# Patient Record
Sex: Male | Born: 1950
Health system: Southern US, Community
[De-identification: ages and names within clinical notes are randomized; demographics above are authoritative.]

## PROBLEM LIST (undated history)

## (undated) DIAGNOSIS — M545 Low back pain, unspecified: Secondary | ICD-10-CM

## (undated) DIAGNOSIS — E1142 Type 2 diabetes mellitus with diabetic polyneuropathy: Secondary | ICD-10-CM

## (undated) DIAGNOSIS — E785 Hyperlipidemia, unspecified: Secondary | ICD-10-CM

## (undated) DIAGNOSIS — I10 Essential (primary) hypertension: Secondary | ICD-10-CM

## (undated) DIAGNOSIS — E119 Type 2 diabetes mellitus without complications: Secondary | ICD-10-CM

## (undated) DIAGNOSIS — G5710 Meralgia paresthetica, unspecified lower limb: Secondary | ICD-10-CM

## (undated) DIAGNOSIS — D128 Benign neoplasm of rectum: Secondary | ICD-10-CM

## (undated) HISTORY — PX: EYE SURGERY: SHX253

## (undated) HISTORY — DX: Meralgia paresthetica, unspecified lower limb: G57.10

## (undated) HISTORY — DX: Low back pain: M54.5

## (undated) HISTORY — DX: Essential (primary) hypertension: I10

## (undated) HISTORY — DX: Benign neoplasm of rectum: D12.8

## (undated) HISTORY — DX: Type 2 diabetes mellitus with diabetic polyneuropathy: E11.42

## (undated) HISTORY — DX: Low back pain, unspecified: M54.50

## (undated) HISTORY — DX: Type 2 diabetes mellitus without complications: E11.9

## (undated) HISTORY — DX: Hyperlipidemia, unspecified: E78.5

---

## 1995-05-15 DIAGNOSIS — E118 Type 2 diabetes mellitus with unspecified complications: Secondary | ICD-10-CM

## 1999-07-24 ENCOUNTER — Encounter: Admission: RE | Admit: 1999-07-24 | Discharge: 1999-10-22 | Payer: Self-pay | Admitting: Family Medicine

## 2001-04-22 ENCOUNTER — Encounter: Admission: RE | Admit: 2001-04-22 | Discharge: 2001-04-22 | Payer: Self-pay | Admitting: Internal Medicine

## 2001-09-24 ENCOUNTER — Encounter: Admission: RE | Admit: 2001-09-24 | Discharge: 2001-09-24 | Payer: Self-pay | Admitting: Internal Medicine

## 2001-09-24 ENCOUNTER — Encounter: Payer: Self-pay | Admitting: Internal Medicine

## 2001-09-24 ENCOUNTER — Ambulatory Visit (HOSPITAL_COMMUNITY): Admission: RE | Admit: 2001-09-24 | Discharge: 2001-09-24 | Payer: Self-pay | Admitting: Internal Medicine

## 2001-10-29 ENCOUNTER — Encounter: Admission: RE | Admit: 2001-10-29 | Discharge: 2001-10-29 | Payer: Self-pay | Admitting: Internal Medicine

## 2001-12-05 ENCOUNTER — Encounter: Admission: RE | Admit: 2001-12-05 | Discharge: 2001-12-05 | Payer: Self-pay | Admitting: Internal Medicine

## 2002-01-07 ENCOUNTER — Encounter: Admission: RE | Admit: 2002-01-07 | Discharge: 2002-01-07 | Payer: Self-pay | Admitting: Internal Medicine

## 2002-02-09 ENCOUNTER — Encounter: Admission: RE | Admit: 2002-02-09 | Discharge: 2002-02-09 | Payer: Self-pay | Admitting: Internal Medicine

## 2002-02-18 ENCOUNTER — Encounter: Admission: RE | Admit: 2002-02-18 | Discharge: 2002-02-18 | Payer: Self-pay | Admitting: Internal Medicine

## 2002-04-02 ENCOUNTER — Encounter: Admission: RE | Admit: 2002-04-02 | Discharge: 2002-04-02 | Payer: Self-pay | Admitting: Internal Medicine

## 2002-06-01 ENCOUNTER — Encounter: Admission: RE | Admit: 2002-06-01 | Discharge: 2002-06-01 | Payer: Self-pay | Admitting: Internal Medicine

## 2002-07-06 ENCOUNTER — Encounter: Admission: RE | Admit: 2002-07-06 | Discharge: 2002-07-06 | Payer: Self-pay | Admitting: Internal Medicine

## 2002-12-04 ENCOUNTER — Encounter: Admission: RE | Admit: 2002-12-04 | Discharge: 2002-12-04 | Payer: Self-pay | Admitting: Internal Medicine

## 2003-04-13 ENCOUNTER — Encounter: Admission: RE | Admit: 2003-04-13 | Discharge: 2003-04-13 | Payer: Self-pay | Admitting: Internal Medicine

## 2003-09-10 ENCOUNTER — Encounter: Admission: RE | Admit: 2003-09-10 | Discharge: 2003-09-10 | Payer: Self-pay | Admitting: Internal Medicine

## 2004-01-21 ENCOUNTER — Ambulatory Visit: Payer: Self-pay | Admitting: Internal Medicine

## 2004-04-24 ENCOUNTER — Ambulatory Visit: Payer: Self-pay | Admitting: Internal Medicine

## 2005-04-27 ENCOUNTER — Ambulatory Visit: Payer: Self-pay | Admitting: Internal Medicine

## 2005-06-26 ENCOUNTER — Ambulatory Visit: Payer: Self-pay | Admitting: Internal Medicine

## 2006-04-03 ENCOUNTER — Ambulatory Visit: Payer: Self-pay | Admitting: Hospitalist

## 2006-05-01 ENCOUNTER — Ambulatory Visit: Payer: Self-pay | Admitting: *Deleted

## 2006-05-01 ENCOUNTER — Encounter (INDEPENDENT_AMBULATORY_CARE_PROVIDER_SITE_OTHER): Payer: Self-pay | Admitting: Ophthalmology

## 2006-05-01 LAB — CONVERTED CEMR LAB
ALT: 14 units/L (ref 0–53)
AST: 11 units/L (ref 0–37)
Albumin: 3.9 g/dL (ref 3.5–5.2)
Alkaline Phosphatase: 75 units/L (ref 39–117)
BUN: 8 mg/dL (ref 6–23)
CO2: 28 meq/L (ref 19–32)
Calcium: 9.3 mg/dL (ref 8.4–10.5)
Chloride: 97 meq/L (ref 96–112)
Cholesterol: 224 mg/dL — ABNORMAL HIGH (ref 0–200)
Creatinine, Ser: 0.7 mg/dL (ref 0.40–1.50)
Glucose, Bld: 400 mg/dL — ABNORMAL HIGH (ref 70–99)
HDL: 55 mg/dL (ref >39)
LDL Cholesterol: 138 mg/dL — ABNORMAL HIGH (ref 0–99)
Potassium: 4 meq/L (ref 3.5–5.3)
Sodium: 131 meq/L — ABNORMAL LOW (ref 135–145)
Total Bilirubin: 0.5 mg/dL (ref 0.3–1.2)
Total CHOL/HDL Ratio: 4.1
Total Protein: 6.4 g/dL (ref 6.0–8.3)
Triglycerides: 155 mg/dL — ABNORMAL HIGH (ref <150)
VLDL: 31 mg/dL (ref 0–40)

## 2006-05-27 ENCOUNTER — Ambulatory Visit: Payer: Self-pay | Admitting: Internal Medicine

## 2006-08-14 ENCOUNTER — Telehealth: Payer: Self-pay | Admitting: *Deleted

## 2007-01-27 ENCOUNTER — Telehealth: Payer: Self-pay | Admitting: *Deleted

## 2007-03-20 ENCOUNTER — Ambulatory Visit: Payer: Self-pay | Admitting: Internal Medicine

## 2007-03-20 ENCOUNTER — Encounter (INDEPENDENT_AMBULATORY_CARE_PROVIDER_SITE_OTHER): Payer: Self-pay | Admitting: Internal Medicine

## 2007-03-20 LAB — CONVERTED CEMR LAB
ALT: 15 units/L (ref 0–53)
AST: 10 units/L (ref 0–37)
Albumin: 4.3 g/dL (ref 3.5–5.2)
Alkaline Phosphatase: 50 units/L (ref 39–117)
BUN: 19 mg/dL (ref 6–23)
Blood Glucose, Fingerstick: 382
CO2: 25 meq/L (ref 19–32)
Calcium: 9.6 mg/dL (ref 8.4–10.5)
Chloride: 96 meq/L (ref 96–112)
Creatinine, Ser: 0.96 mg/dL (ref 0.40–1.50)
Glucose, Bld: 506 mg/dL (ref 70–99)
HCT: 40.8 % (ref 39.0–52.0)
Hemoglobin: 14.1 g/dL (ref 13.0–17.0)
Hgb A1c MFr Bld: >14 %
MCHC: 34.6 g/dL (ref 30.0–36.0)
MCV: 92.7 fL (ref 78.0–100.0)
Platelets: 208 10*3/uL (ref 150–400)
Potassium: 4.4 meq/L (ref 3.5–5.3)
RBC: 4.4 M/uL (ref 4.22–5.81)
RDW: 12.2 % (ref 11.5–14.0)
Sodium: 135 meq/L (ref 135–145)
Total Bilirubin: 0.4 mg/dL (ref 0.3–1.2)
Total Protein: 6.6 g/dL (ref 6.0–8.3)
WBC: 6.2 10*3/uL (ref 4.0–10.5)

## 2007-03-21 ENCOUNTER — Telehealth (INDEPENDENT_AMBULATORY_CARE_PROVIDER_SITE_OTHER): Payer: Self-pay | Admitting: Infectious Diseases

## 2007-03-27 ENCOUNTER — Encounter (INDEPENDENT_AMBULATORY_CARE_PROVIDER_SITE_OTHER): Payer: Self-pay | Admitting: Internal Medicine

## 2007-03-27 ENCOUNTER — Ambulatory Visit: Payer: Self-pay | Admitting: Internal Medicine

## 2007-03-27 LAB — CONVERTED CEMR LAB
Cholesterol: 187 mg/dL (ref 0–200)
HDL: 80 mg/dL (ref >39)
LDL Cholesterol: 87 mg/dL (ref 0–99)
Total CHOL/HDL Ratio: 2.3
Triglycerides: 102 mg/dL (ref <150)
VLDL: 20 mg/dL (ref 0–40)

## 2007-05-19 ENCOUNTER — Ambulatory Visit: Payer: Self-pay | Admitting: Internal Medicine

## 2007-05-19 DIAGNOSIS — F528 Other sexual dysfunction not due to a substance or known physiological condition: Secondary | ICD-10-CM

## 2007-05-19 DIAGNOSIS — G5710 Meralgia paresthetica, unspecified lower limb: Secondary | ICD-10-CM | POA: Insufficient documentation

## 2007-05-19 LAB — CONVERTED CEMR LAB: Blood Glucose, Fingerstick: 336

## 2007-06-02 ENCOUNTER — Ambulatory Visit: Payer: Self-pay | Admitting: Internal Medicine

## 2007-06-02 ENCOUNTER — Encounter (INDEPENDENT_AMBULATORY_CARE_PROVIDER_SITE_OTHER): Payer: Self-pay | Admitting: *Deleted

## 2007-06-02 LAB — CONVERTED CEMR LAB: Blood Glucose, Fingerstick: 246

## 2007-06-03 DIAGNOSIS — L299 Pruritus, unspecified: Secondary | ICD-10-CM | POA: Insufficient documentation

## 2007-06-03 LAB — CONVERTED CEMR LAB
ALT: 27 units/L (ref 0–53)
AST: 15 units/L (ref 0–37)
Albumin: 4.5 g/dL (ref 3.5–5.2)
Alkaline Phosphatase: 55 units/L (ref 39–117)
BUN: 14 mg/dL (ref 6–23)
CO2: 25 meq/L (ref 19–32)
Calcium: 9.9 mg/dL (ref 8.4–10.5)
Chloride: 102 meq/L (ref 96–112)
Creatinine, Ser: 0.68 mg/dL (ref 0.40–1.50)
Glucose, Bld: 239 mg/dL — ABNORMAL HIGH (ref 70–99)
Potassium: 4.3 meq/L (ref 3.5–5.3)
Sodium: 140 meq/L (ref 135–145)
Total Bilirubin: 0.5 mg/dL (ref 0.3–1.2)
Total Protein: 7.1 g/dL (ref 6.0–8.3)

## 2007-06-09 ENCOUNTER — Telehealth (INDEPENDENT_AMBULATORY_CARE_PROVIDER_SITE_OTHER): Payer: Self-pay | Admitting: Pharmacy Technician

## 2007-07-03 ENCOUNTER — Ambulatory Visit: Payer: Self-pay | Admitting: Internal Medicine

## 2007-07-03 ENCOUNTER — Encounter (INDEPENDENT_AMBULATORY_CARE_PROVIDER_SITE_OTHER): Payer: Self-pay | Admitting: *Deleted

## 2007-07-03 LAB — CONVERTED CEMR LAB
Basophils Absolute: 0 10*3/uL (ref 0.0–0.1)
Basophils Relative: 0 % (ref 0–1)
Blood Glucose, Fingerstick: 178
Eosinophils Absolute: 0.1 10*3/uL (ref 0.0–0.7)
Eosinophils Relative: 2 % (ref 0–5)
HCT: 37.9 % — ABNORMAL LOW (ref 39.0–52.0)
Hemoglobin: 13.6 g/dL (ref 13.0–17.0)
Hgb A1c MFr Bld: 8.6 %
Lymphocytes Relative: 25 % (ref 12–46)
Lymphs Abs: 2 10*3/uL (ref 0.7–4.0)
MCHC: 35.9 g/dL (ref 30.0–36.0)
MCV: 87.9 fL (ref 78.0–100.0)
Monocytes Absolute: 0.6 10*3/uL (ref 0.1–1.0)
Monocytes Relative: 7 % (ref 3–12)
Neutro Abs: 5.3 10*3/uL (ref 1.7–7.7)
Neutrophils Relative %: 66 % (ref 43–77)
Platelets: 255 10*3/uL (ref 150–400)
RBC: 4.31 M/uL (ref 4.22–5.81)
RDW: 12.3 % (ref 11.5–15.5)
WBC: 8 10*3/uL (ref 4.0–10.5)

## 2007-07-23 ENCOUNTER — Ambulatory Visit: Payer: Self-pay | Admitting: Infectious Diseases

## 2007-07-23 DIAGNOSIS — E1142 Type 2 diabetes mellitus with diabetic polyneuropathy: Secondary | ICD-10-CM | POA: Insufficient documentation

## 2007-07-23 LAB — CONVERTED CEMR LAB: Blood Glucose, Fingerstick: 276

## 2007-08-06 ENCOUNTER — Encounter (INDEPENDENT_AMBULATORY_CARE_PROVIDER_SITE_OTHER): Payer: Self-pay | Admitting: Internal Medicine

## 2007-08-24 ENCOUNTER — Encounter (INDEPENDENT_AMBULATORY_CARE_PROVIDER_SITE_OTHER): Payer: Self-pay | Admitting: Internal Medicine

## 2007-08-28 ENCOUNTER — Ambulatory Visit: Payer: Self-pay | Admitting: Infectious Disease

## 2007-08-28 ENCOUNTER — Encounter (INDEPENDENT_AMBULATORY_CARE_PROVIDER_SITE_OTHER): Payer: Self-pay | Admitting: Internal Medicine

## 2007-08-28 LAB — CONVERTED CEMR LAB: Blood Glucose, Fingerstick: 235

## 2007-08-29 LAB — CONVERTED CEMR LAB
Creatinine, Urine: 162.4 mg/dL
Microalb Creat Ratio: 17 mg/g
Microalb, Ur: 2.76 mg/dL — ABNORMAL HIGH

## 2007-09-05 ENCOUNTER — Encounter (INDEPENDENT_AMBULATORY_CARE_PROVIDER_SITE_OTHER): Payer: Self-pay | Admitting: Internal Medicine

## 2007-09-10 ENCOUNTER — Encounter (INDEPENDENT_AMBULATORY_CARE_PROVIDER_SITE_OTHER): Payer: Self-pay | Admitting: Internal Medicine

## 2007-09-24 ENCOUNTER — Encounter (INDEPENDENT_AMBULATORY_CARE_PROVIDER_SITE_OTHER): Payer: Self-pay | Admitting: Internal Medicine

## 2007-09-24 ENCOUNTER — Ambulatory Visit: Payer: Self-pay | Admitting: Infectious Disease

## 2007-09-24 DIAGNOSIS — M25569 Pain in unspecified knee: Secondary | ICD-10-CM

## 2007-09-24 LAB — CONVERTED CEMR LAB
ALT: 11 units/L (ref 0–53)
AST: 9 units/L (ref 0–37)
Albumin: 4.3 g/dL (ref 3.5–5.2)
Alkaline Phosphatase: 49 units/L (ref 39–117)
BUN: 14 mg/dL (ref 6–23)
CO2: 24 meq/L (ref 19–32)
Calcium: 9.4 mg/dL (ref 8.4–10.5)
Chloride: 97 meq/L (ref 96–112)
Creatinine, Ser: 0.74 mg/dL (ref 0.40–1.50)
Glucose, Bld: 261 mg/dL — ABNORMAL HIGH (ref 70–99)
Potassium: 4.3 meq/L (ref 3.5–5.3)
Sodium: 134 meq/L — ABNORMAL LOW (ref 135–145)
Total Bilirubin: 0.3 mg/dL (ref 0.3–1.2)
Total Protein: 6.7 g/dL (ref 6.0–8.3)

## 2007-09-30 ENCOUNTER — Telehealth (INDEPENDENT_AMBULATORY_CARE_PROVIDER_SITE_OTHER): Payer: Self-pay | Admitting: Internal Medicine

## 2007-10-22 ENCOUNTER — Encounter: Payer: Self-pay | Admitting: Internal Medicine

## 2007-10-22 DIAGNOSIS — E11319 Type 2 diabetes mellitus with unspecified diabetic retinopathy without macular edema: Secondary | ICD-10-CM | POA: Insufficient documentation

## 2007-10-22 LAB — HM DIABETES EYE EXAM

## 2007-11-25 ENCOUNTER — Ambulatory Visit: Payer: Self-pay | Admitting: Infectious Diseases

## 2007-11-25 DIAGNOSIS — K2900 Acute gastritis without bleeding: Secondary | ICD-10-CM | POA: Insufficient documentation

## 2007-11-25 LAB — CONVERTED CEMR LAB
Blood Glucose, Fingerstick: 138
Hgb A1c MFr Bld: 6.3 %

## 2008-03-10 ENCOUNTER — Encounter: Payer: Self-pay | Admitting: Internal Medicine

## 2008-03-10 ENCOUNTER — Ambulatory Visit (HOSPITAL_COMMUNITY): Admission: RE | Admit: 2008-03-10 | Discharge: 2008-03-10 | Payer: Self-pay | Admitting: Internal Medicine

## 2008-03-10 ENCOUNTER — Ambulatory Visit: Payer: Self-pay | Admitting: Internal Medicine

## 2008-03-10 ENCOUNTER — Telehealth (INDEPENDENT_AMBULATORY_CARE_PROVIDER_SITE_OTHER): Payer: Self-pay | Admitting: Internal Medicine

## 2008-03-10 LAB — CONVERTED CEMR LAB
Blood Glucose, Fingerstick: 109
Hgb A1c MFr Bld: 6.1 %

## 2008-03-16 LAB — CONVERTED CEMR LAB
ALT: 10 units/L (ref 0–53)
AST: 11 units/L (ref 0–37)
Albumin: 4.7 g/dL (ref 3.5–5.2)
Alkaline Phosphatase: 77 units/L (ref 39–117)
BUN: 11 mg/dL (ref 6–23)
CO2: 26 meq/L (ref 19–32)
Calcium: 10 mg/dL (ref 8.4–10.5)
Chloride: 101 meq/L (ref 96–112)
Cholesterol: 177 mg/dL (ref 0–200)
Creatinine, Ser: 0.84 mg/dL (ref 0.40–1.50)
Creatinine, Urine: 224.6 mg/dL
Glucose, Bld: 102 mg/dL — ABNORMAL HIGH (ref 70–99)
HDL: 48 mg/dL (ref >39)
LDL Cholesterol: 103 mg/dL — ABNORMAL HIGH (ref 0–99)
Microalb Creat Ratio: 16.1 mg/g (ref 0.0–30.0)
Microalb, Ur: 3.61 mg/dL — ABNORMAL HIGH (ref 0.00–1.89)
Potassium: 4.3 meq/L (ref 3.5–5.3)
Sodium: 139 meq/L (ref 135–145)
Total Bilirubin: 0.4 mg/dL (ref 0.3–1.2)
Total CHOL/HDL Ratio: 3.7
Total Protein: 7.5 g/dL (ref 6.0–8.3)
Triglycerides: 130 mg/dL (ref <150)
VLDL: 26 mg/dL (ref 0–40)

## 2008-06-15 ENCOUNTER — Encounter: Payer: Self-pay | Admitting: Internal Medicine

## 2008-06-15 ENCOUNTER — Ambulatory Visit: Payer: Self-pay | Admitting: Internal Medicine

## 2008-07-09 ENCOUNTER — Ambulatory Visit: Payer: Self-pay | Admitting: Internal Medicine

## 2008-07-09 DIAGNOSIS — I10 Essential (primary) hypertension: Secondary | ICD-10-CM | POA: Insufficient documentation

## 2008-07-09 LAB — CONVERTED CEMR LAB: Blood Glucose, Fingerstick: 208

## 2008-08-16 ENCOUNTER — Ambulatory Visit: Payer: Self-pay | Admitting: Internal Medicine

## 2008-08-16 DIAGNOSIS — M545 Low back pain: Secondary | ICD-10-CM

## 2008-08-16 LAB — CONVERTED CEMR LAB: Blood Glucose, Fingerstick: 148

## 2008-08-18 ENCOUNTER — Telehealth (INDEPENDENT_AMBULATORY_CARE_PROVIDER_SITE_OTHER): Payer: Self-pay | Admitting: Pharmacy Technician

## 2008-09-01 ENCOUNTER — Ambulatory Visit: Payer: Self-pay | Admitting: Internal Medicine

## 2008-09-01 ENCOUNTER — Encounter: Payer: Self-pay | Admitting: Internal Medicine

## 2008-09-01 LAB — CONVERTED CEMR LAB
BUN: 18 mg/dL (ref 6–23)
Blood Glucose, Fingerstick: 261
CO2: 25 meq/L (ref 19–32)
Calcium: 9.5 mg/dL (ref 8.4–10.5)
Chloride: 102 meq/L (ref 96–112)
Creatinine, Ser: 1.17 mg/dL (ref 0.40–1.50)
GFR calc Af Amer: >60 mL/min (ref >60)
GFR calc non Af Amer: >60 mL/min (ref >60)
Glucose, Bld: 286 mg/dL — ABNORMAL HIGH (ref 70–99)
Potassium: 4.8 meq/L (ref 3.5–5.3)
Sodium: 137 meq/L (ref 135–145)

## 2008-09-14 ENCOUNTER — Ambulatory Visit: Payer: Self-pay | Admitting: Infectious Disease

## 2008-09-14 LAB — CONVERTED CEMR LAB
OCCULT 1: NEGATIVE
OCCULT 2: NEGATIVE
OCCULT 3: NEGATIVE

## 2008-09-14 LAB — FECAL OCCULT BLOOD, GUAIAC: Fecal Occult Blood: NEGATIVE

## 2008-10-05 ENCOUNTER — Ambulatory Visit: Payer: Self-pay | Admitting: Infectious Disease

## 2008-10-05 ENCOUNTER — Encounter (INDEPENDENT_AMBULATORY_CARE_PROVIDER_SITE_OTHER): Payer: Self-pay | Admitting: Internal Medicine

## 2008-10-05 LAB — CONVERTED CEMR LAB
Blood Glucose, AC Bkfst: 119 mg/dL
Creatinine, Urine: 134.5 mg/dL
Hgb A1c MFr Bld: 7 %
Microalb Creat Ratio: 6.3 mg/g (ref 0.0–30.0)
Microalb, Ur: 0.85 mg/dL (ref 0.00–1.89)

## 2008-10-06 DIAGNOSIS — E781 Pure hyperglyceridemia: Secondary | ICD-10-CM

## 2008-10-06 LAB — CONVERTED CEMR LAB
Cholesterol: 150 mg/dL (ref 0–200)
HDL: 36 mg/dL — ABNORMAL LOW (ref >39)
LDL Cholesterol: 81 mg/dL (ref 0–99)
Total CHOL/HDL Ratio: 4.2
Triglycerides: 167 mg/dL — ABNORMAL HIGH (ref <150)
VLDL: 33 mg/dL (ref 0–40)

## 2008-10-19 ENCOUNTER — Ambulatory Visit: Payer: Self-pay | Admitting: Internal Medicine

## 2008-10-19 ENCOUNTER — Encounter (INDEPENDENT_AMBULATORY_CARE_PROVIDER_SITE_OTHER): Payer: Self-pay | Admitting: Internal Medicine

## 2008-10-19 LAB — CONVERTED CEMR LAB
ALT: 11 units/L (ref 0–53)
AST: 12 units/L (ref 0–37)
Albumin: 4.5 g/dL (ref 3.5–5.2)
Alkaline Phosphatase: 68 units/L (ref 39–117)
BUN: 22 mg/dL (ref 6–23)
Blood Glucose, Fingerstick: 234
CO2: 24 meq/L (ref 19–32)
Calcium: 9.1 mg/dL (ref 8.4–10.5)
Chloride: 106 meq/L (ref 96–112)
Creatinine, Ser: 1.15 mg/dL (ref 0.40–1.50)
GFR calc Af Amer: >60 mL/min (ref >60)
GFR calc non Af Amer: >60 mL/min (ref >60)
Glucose, Bld: 238 mg/dL — ABNORMAL HIGH (ref 70–99)
Potassium: 4.6 meq/L (ref 3.5–5.3)
Sodium: 141 meq/L (ref 135–145)
Total Bilirubin: 0.2 mg/dL — ABNORMAL LOW (ref 0.3–1.2)
Total Protein: 6.9 g/dL (ref 6.0–8.3)

## 2008-10-25 ENCOUNTER — Encounter: Payer: Self-pay | Admitting: Internal Medicine

## 2008-11-01 ENCOUNTER — Ambulatory Visit: Payer: Self-pay | Admitting: Internal Medicine

## 2008-11-01 LAB — CONVERTED CEMR LAB: Blood Glucose, Home Monitor: 2 mg/dL

## 2008-11-22 ENCOUNTER — Ambulatory Visit: Payer: Self-pay | Admitting: Internal Medicine

## 2008-11-22 ENCOUNTER — Encounter: Payer: Self-pay | Admitting: Internal Medicine

## 2008-11-22 LAB — CONVERTED CEMR LAB: Blood Glucose, Fingerstick: 180

## 2008-11-25 ENCOUNTER — Telehealth: Payer: Self-pay | Admitting: Internal Medicine

## 2008-12-28 ENCOUNTER — Ambulatory Visit: Payer: Self-pay | Admitting: Internal Medicine

## 2008-12-28 LAB — CONVERTED CEMR LAB
Blood Glucose, Fingerstick: 164
Hgb A1c MFr Bld: 7.3 %

## 2009-03-18 ENCOUNTER — Telehealth: Payer: Self-pay | Admitting: Internal Medicine

## 2009-03-30 ENCOUNTER — Ambulatory Visit: Payer: Self-pay | Admitting: Internal Medicine

## 2009-03-30 ENCOUNTER — Encounter: Payer: Self-pay | Admitting: Internal Medicine

## 2009-03-30 LAB — CONVERTED CEMR LAB
Blood Glucose, Fingerstick: 275
Hgb A1c MFr Bld: 8.7 %

## 2009-03-31 ENCOUNTER — Encounter: Payer: Self-pay | Admitting: Internal Medicine

## 2009-06-13 IMAGING — CR DG KNEE COMPLETE 4+V*R*
4 series · 4 of 4 positions shown · non-contrast
Comparison: None available.

CLINICAL DATA: Knee pain.

RIGHT KNEE - COMPLETE 4+ VIEW

[t knee ap right]
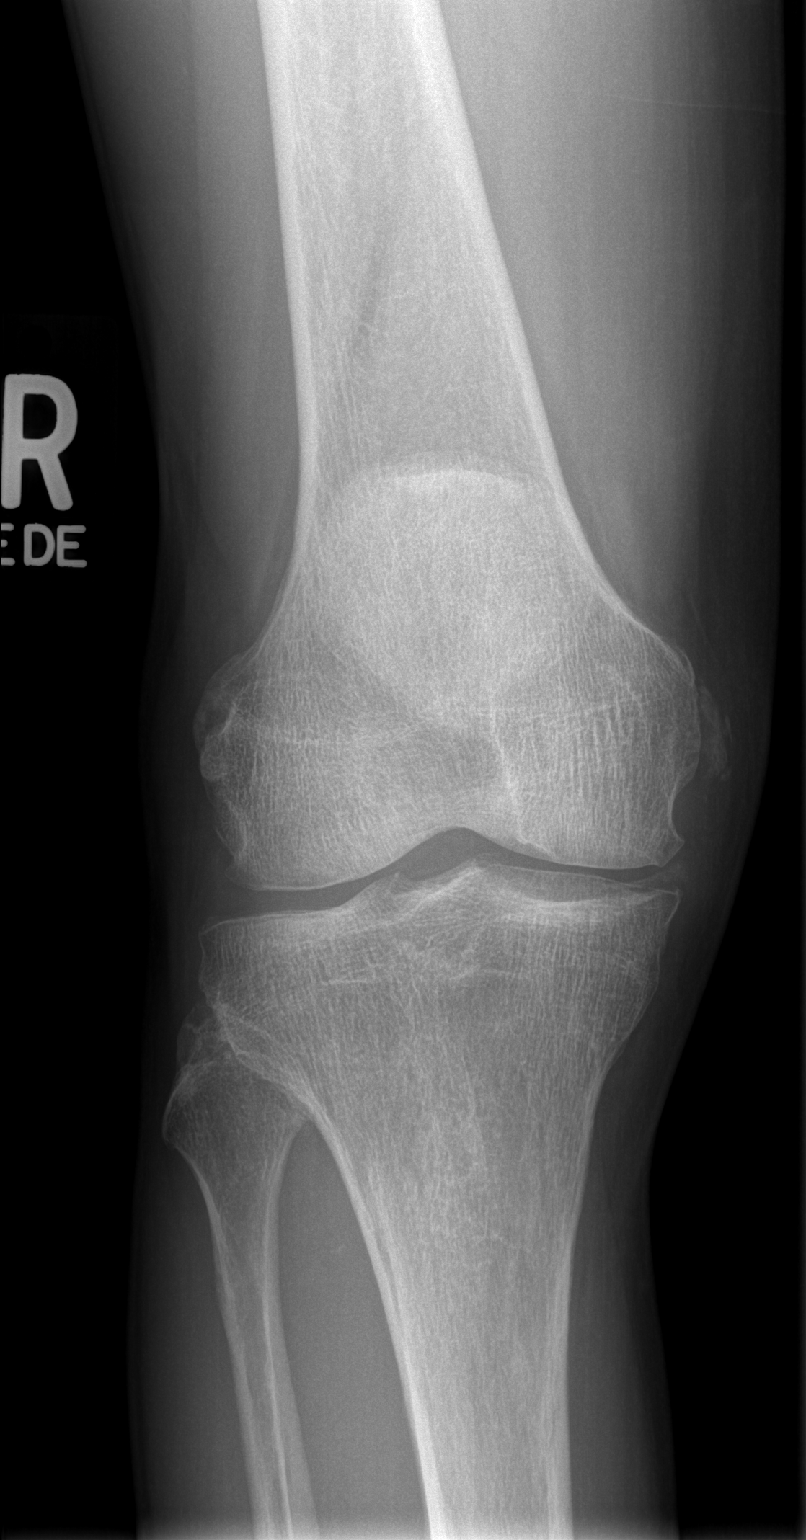

[t knee oblique right (1 of 2)]
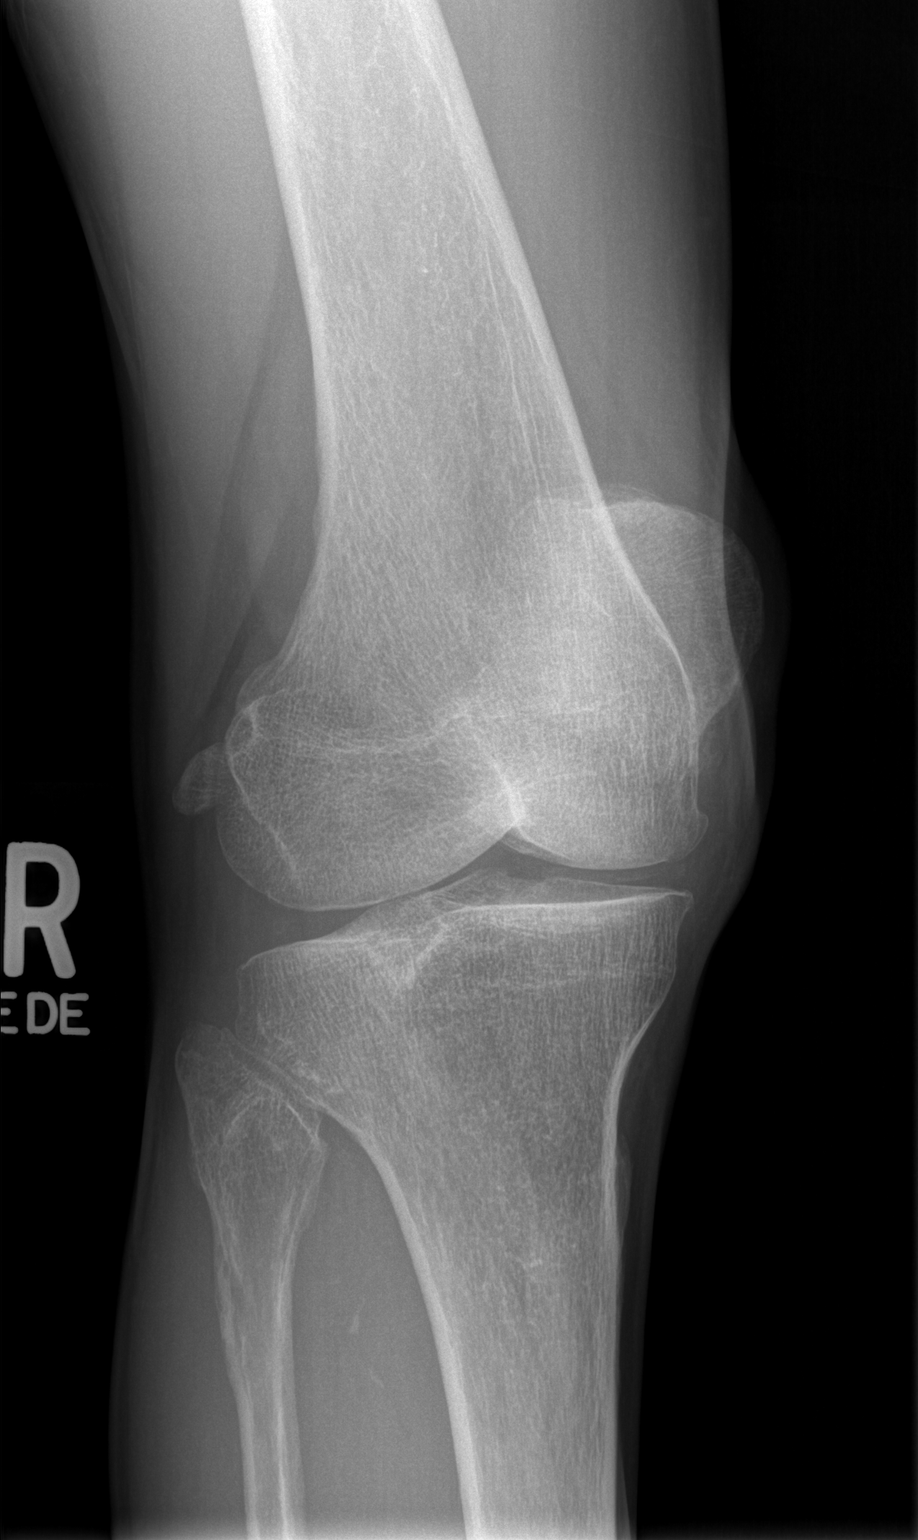

[t knee oblique right (2 of 2)]
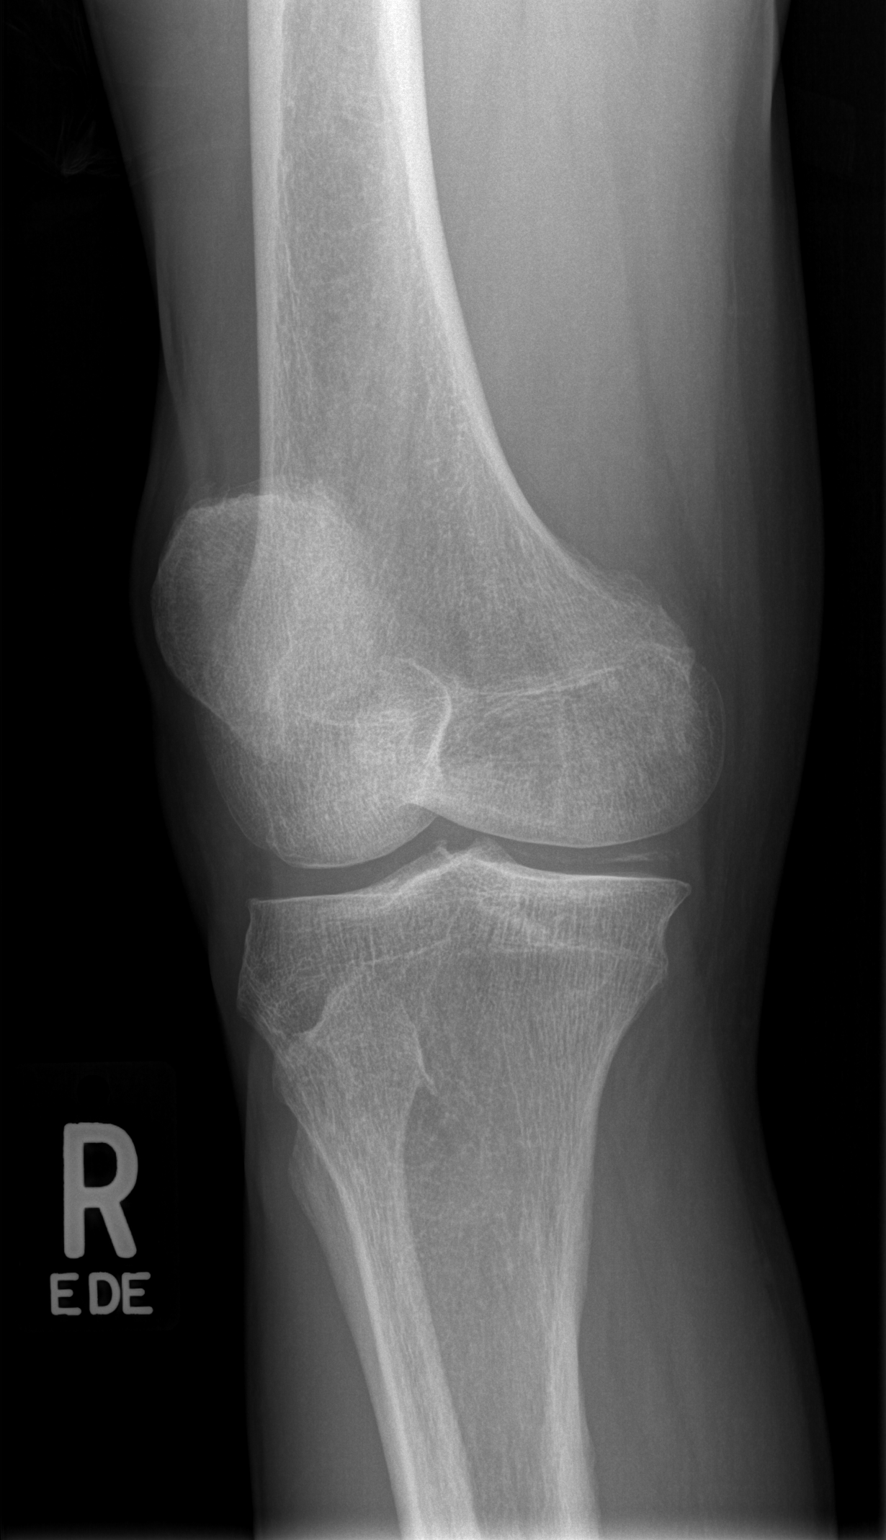

[t knee lat right]
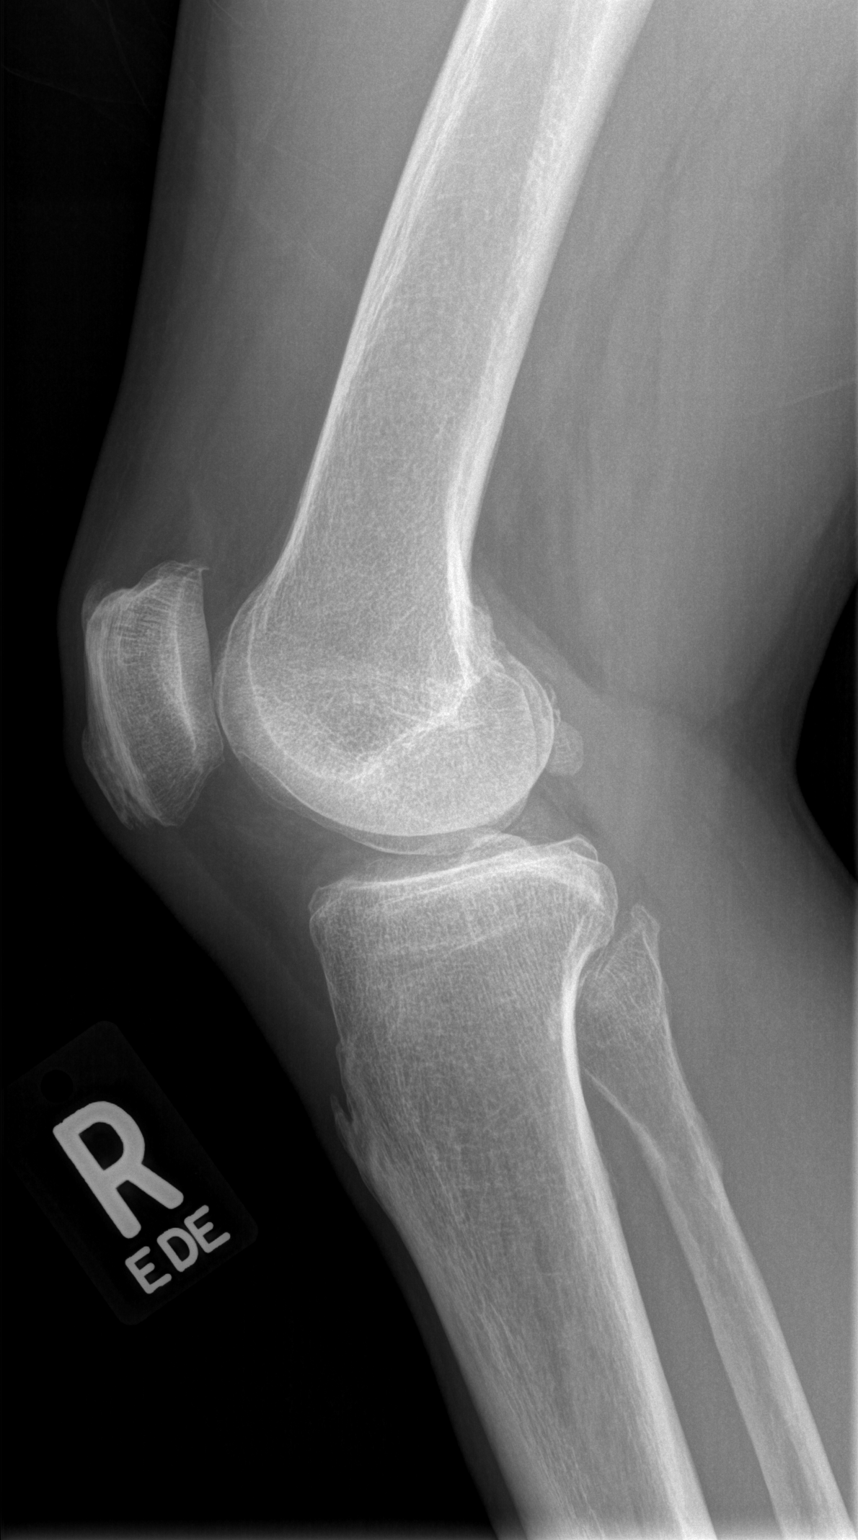

[4 of 4 positions shown; findings below may reference images not displayed]

FINDINGS: Calcification off the medial femoral condyle is
consistent with old MCL injury.  There is degenerative change about
the knee with osteophytes most prominent in the medial compartment.
Chondrocalcinosis is also seen and most prominent in the medial
compartment.  No fracture or joint effusion.
IMPRESSION: 1.  No acute finding.
2.  Degenerative disease about the knee most notable the medial
compartment.
3.  Findings compatible with old MCL injury.

## 2009-06-23 ENCOUNTER — Encounter: Payer: Self-pay | Admitting: Internal Medicine

## 2009-07-05 ENCOUNTER — Ambulatory Visit: Payer: Self-pay | Admitting: Internal Medicine

## 2009-07-05 LAB — CONVERTED CEMR LAB
ALT: 11 units/L (ref 0–53)
AST: 11 units/L (ref 0–37)
Albumin: 4.6 g/dL (ref 3.5–5.2)
Alkaline Phosphatase: 71 units/L (ref 39–117)
BUN: 17 mg/dL (ref 6–23)
Blood Glucose, Fingerstick: 129
CO2: 24 meq/L (ref 19–32)
Calcium: 9.7 mg/dL (ref 8.4–10.5)
Chloride: 104 meq/L (ref 96–112)
Cholesterol: 139 mg/dL (ref 0–200)
Creatinine, Ser: 1.1 mg/dL (ref 0.40–1.50)
Glucose, Bld: 133 mg/dL — ABNORMAL HIGH (ref 70–99)
HCT: 35.2 % — ABNORMAL LOW (ref 39.0–52.0)
HDL: 40 mg/dL (ref >39)
Hemoglobin: 11.5 g/dL — ABNORMAL LOW (ref 13.0–17.0)
Hgb A1c MFr Bld: 8.5 %
LDL Cholesterol: 77 mg/dL (ref 0–99)
MCHC: 32.7 g/dL (ref 30.0–36.0)
MCV: 87.8 fL (ref >=78.0)
Platelets: 211 10*3/uL (ref 150–400)
Potassium: 5 meq/L (ref 3.5–5.3)
RBC: 4.01 M/uL — ABNORMAL LOW (ref 4.22–5.81)
RDW: 13.2 % (ref 11.5–15.5)
Sodium: 139 meq/L (ref 135–145)
Total Bilirubin: 0.3 mg/dL (ref 0.3–1.2)
Total CHOL/HDL Ratio: 3.5
Total Protein: 7 g/dL (ref 6.0–8.3)
Triglycerides: 109 mg/dL (ref <150)
VLDL: 22 mg/dL (ref 0–40)
WBC: 6 10*3/uL (ref 4.0–10.5)

## 2009-08-03 ENCOUNTER — Encounter: Payer: Self-pay | Admitting: Internal Medicine

## 2009-11-03 ENCOUNTER — Ambulatory Visit: Payer: Self-pay | Admitting: Internal Medicine

## 2009-11-03 LAB — CONVERTED CEMR LAB
Blood Glucose, Fingerstick: 310
Hgb A1c MFr Bld: 8.1 %

## 2010-02-14 ENCOUNTER — Encounter: Payer: Self-pay | Admitting: Internal Medicine

## 2010-02-16 ENCOUNTER — Ambulatory Visit: Payer: Self-pay | Admitting: Internal Medicine

## 2010-02-16 DIAGNOSIS — R404 Transient alteration of awareness: Secondary | ICD-10-CM

## 2010-02-16 LAB — CONVERTED CEMR LAB
Blood Glucose, Fingerstick: 259
Hgb A1c MFr Bld: 8.5 %

## 2010-05-02 ENCOUNTER — Telehealth: Payer: Self-pay | Admitting: Internal Medicine

## 2010-05-25 ENCOUNTER — Ambulatory Visit
Admission: RE | Admit: 2010-05-25 | Discharge: 2010-05-25 | Payer: Self-pay | Source: Home / Self Care | Attending: Internal Medicine | Admitting: Internal Medicine

## 2010-05-25 ENCOUNTER — Encounter (INDEPENDENT_AMBULATORY_CARE_PROVIDER_SITE_OTHER): Payer: Self-pay | Admitting: *Deleted

## 2010-05-25 LAB — CONVERTED CEMR LAB
Blood Glucose, Fingerstick: 204
Creatinine, Urine: 61.1 mg/dL
Hgb A1c MFr Bld: 8.2 %
Microalb Creat Ratio: 19.5 mg/g (ref 0.0–30.0)
Microalb, Ur: 1.19 mg/dL (ref 0.00–1.89)

## 2010-05-29 LAB — GLUCOSE, CAPILLARY: Glucose-Capillary: 204 mg/dL — ABNORMAL HIGH (ref 70–99)

## 2010-05-31 ENCOUNTER — Encounter: Payer: Self-pay | Admitting: Internal Medicine

## 2010-06-02 ENCOUNTER — Telehealth: Payer: Self-pay | Admitting: Licensed Clinical Social Worker

## 2010-06-02 ENCOUNTER — Encounter (INDEPENDENT_AMBULATORY_CARE_PROVIDER_SITE_OTHER): Payer: Self-pay | Admitting: *Deleted

## 2010-06-07 ENCOUNTER — Encounter: Payer: Self-pay | Admitting: Licensed Clinical Social Worker

## 2010-06-09 ENCOUNTER — Encounter (INDEPENDENT_AMBULATORY_CARE_PROVIDER_SITE_OTHER): Payer: Self-pay | Admitting: *Deleted

## 2010-06-09 ENCOUNTER — Ambulatory Visit
Admission: RE | Admit: 2010-06-09 | Discharge: 2010-06-09 | Payer: Self-pay | Source: Home / Self Care | Attending: Internal Medicine | Admitting: Internal Medicine

## 2010-06-11 LAB — CONVERTED CEMR LAB
ALT: 13 units/L (ref 0–53)
AST: 12 units/L (ref 0–37)
Albumin: 4.4 g/dL (ref 3.5–5.2)
Alkaline Phosphatase: 81 units/L (ref 39–117)
BUN: 16 mg/dL (ref 6–23)
Blood Glucose, Fingerstick: 99
CO2: 22 meq/L (ref 19–32)
Calcium: 9.6 mg/dL (ref 8.4–10.5)
Chloride: 102 meq/L (ref 96–112)
Cholesterol: 202 mg/dL — ABNORMAL HIGH (ref 0–200)
Creatinine, Ser: 0.89 mg/dL (ref 0.40–1.50)
Glucose, Bld: 95 mg/dL (ref 70–99)
HDL: 49 mg/dL (ref >39)
Hgb A1c MFr Bld: 6.7 %
LDL Cholesterol: 126 mg/dL — ABNORMAL HIGH (ref 0–99)
Potassium: 4.8 meq/L (ref 3.5–5.3)
Sodium: 143 meq/L (ref 135–145)
TSH: 2.348 microintl units/mL (ref 0.350–4.50)
Testosterone: 345.78 ng/dL — ABNORMAL LOW (ref 350–890)
Total Bilirubin: 0.3 mg/dL (ref 0.3–1.2)
Total CHOL/HDL Ratio: 4.1
Total Protein: 6.8 g/dL (ref 6.0–8.3)
Triglycerides: 133 mg/dL (ref <150)
VLDL: 27 mg/dL (ref 0–40)

## 2010-06-13 NOTE — Assessment & Plan Note (Signed)
Summary: EST-3 MONTH CHECKUP/CH   Vital Signs:  Patient profile:   60 year old male Height:      64.5 inches (163.83 cm) Weight:      153.04 pounds (69.56 kg) BMI:     25.96 Temp:     97.1 degrees F (36.17 degrees C) oral Pulse rate:   69 / minute BP sitting:   114 / 68  (right arm) Cuff size:   regular  Vitals Entered By: Sander Nephew RN (February 16, 2010 3:41 PM) CC: Depression Is Patient Diabetic? Yes Did you bring your meter with you today? No Pain Assessment Patient in pain? no      Nutritional Status BMI of 25 - 29 = overweight CBG Result 259  Have you ever been in a relationship where you felt threatened, hurt or afraid?No   Does patient need assistance? Functional Status Self care Ambulation Normal Comments Check up.   Primary Care [facility] Can:  Rudie Meyer MD  CC:  Depression.  History of Present Illness: 60 yr old man with pmhx as described below comes to the clinic for follow up. Patient has no complains.  Reports to have gotten Medicare.  Depression History:      The patient denies a depressed mood most of the day and a diminished interest in his usual daily activities.         Preventive Screening-Counseling & Management  Alcohol-Tobacco     Alcohol type: occasionally     Smoking Status: never     Year Quit: quit smoking  8 months ago  Problems Prior to Update: 1)  Preventive Health Care  (ICD-V70.0) 2)  Back Pain, Lumbar  (ICD-724.2) 3)  Hypertension, Benign Essential, Controlled  (ICD-401.1) 4)  Knee Pain  (ICD-719.46) 5)  Acute Gastritis Without Mention of Hemorrhage  (ICD-535.00) 6)  Knee Pain, Bilateral  (ICD-719.46) 7)  Diabetic Peripheral Neuropathy  (ICD-250.60) 8)  Pruritus  (ICD-698.9) 9)  Erectile Dysfunction  (ICD-302.72) 10)  Meralgia Paresthetica  (ICD-355.1) 11)  Diabetes Mellitus, Type II  (ICD-250.00) 12)  Dyslipidemia  (ICD-272.4)  Medications Prior to Update: 1)  Lantus 100 Unit/ml Soln (Insulin Glargine) ....  Inject 15 Units Before Bedtime. 2)  Relion Mini Pen Needles 31g X 6 Mm  Misc (Insulin Pen Needle) .... Use To Inject Lantus Iinsulin Once Daily 3)  Glucophage 1000 Mg  Tabs (Metformin Hcl) .... Take 1 Tablet By Mouth Two Times A Day 4)  Anacin 81 Mg Tbec (Aspirin) .... Take 1 Tablet By Mouth Once A Day 5)  Hydrocortisone 1 % Crea (Hydrocortisone) .... Aplica [place] Dos A Cuatro Vezes A Dia 6)  Pravachol 40 Mg Tabs (Pravastatin Sodium) .... Take 1 Tablet By Mouth Once A Day 7)  Viagra 50 Mg Tabs (Sildenafil Citrate) .... Take One Tablet One Hour Before Intercourse 8)  Prinivil 10 Mg Tabs (Lisinopril) .... Take 1/2  Tablet By Mouth Once A Day 9)  Bayer Contour Test  Strp (Glucose Blood) .... Tocar Su [place] 10)  Lancets Ultra Thin  Misc (Lancets) .... Tocar Su Azucar [place] Cena  Current Medications (verified): 1)  Lantus 100 Unit/ml Soln (Insulin Glargine) .... Inject 15 Units Before Bedtime. 2)  Relion Mini Pen Needles 31g X 6 Mm  Misc (Insulin Pen Needle) .... Use To Inject Lantus Iinsulin Once Daily 3)  Glucophage 1000 Mg  Tabs (Metformin Hcl) .... Take 1 Tablet By  Mouth Two Times A Day 4)  Anacin 81 Mg Tbec (Aspirin) .... Take 1 Tablet By Mouth Once A Day 5)  Hydrocortisone 1 % Crea (Hydrocortisone) .... Aplica [place] Dos A Cuatro Vezes A Dia 6)  Pravachol 40 Mg Tabs (Pravastatin Sodium) .... Take 1 Tablet By Mouth Once A Day 7)  Viagra 50 Mg Tabs (Sildenafil Citrate) .... Take One Tablet One Hour Before Intercourse 8)  Prinivil 10 Mg Tabs (Lisinopril) .... Take 1/2  Tablet By Mouth Once A Day 9)  Bayer Contour Test  Strp (Glucose Blood) .... Tocar Su Azuc[place] 10)  Lancets Ultra Thin  Misc (Lancets) .... Tocar Su Azucar [place]  Allergies: No Known Drug Allergies  Past History:  Past Medical History: Last  updated: 06/15/2008 Diabetes mellitus, type II - Dx 1999 Hyperlipidemia Hypertension Peripheral neuropathy- secondary to DM Hx of tobacco use  Social History: Last updated: 03/10/2008 No hx of ETOH, smoking, or illegal drugs.  Used to work for [employer].   Risk Factors: Caffeine Use: 1 (11/03/2009) Exercise: no (11/03/2009)  Risk Factors: Smoking Status: never (02/16/2010)  Social History: Reviewed history from 03/10/2008 and no changes required. No hx of ETOH, smoking, or illegal drugs.  Used to work for [employer].   Review of Systems  The patient denies fever, chest pain, dyspnea on exertion, hemoptysis, abdominal pain, melena, hematochezia, hematuria, and muscle weakness.    Physical Exam  General:  NAD Mouth:  MMM Neck:  supple.   Lungs:  normal respiratory effort, no intercostal retractions, no accessory muscle use, and normal breath sounds.   Heart:  normal rate and regular rhythm.   Abdomen:  soft, non-tender, normal bowel sounds, and no distention.   Msk:  normal ROM.   Extremities:  no edema. Neurologic:  alert & oriented X3, cranial nerves II-XII intact, and strength normal in all extremities.    Diabetes Management Exam:    Foot Exam (with socks and/or shoes not present):       Sensory-Monofilament:          Left foot: normal          Right foot: normal   Impression & Recommendations:  Problem # 1:  DIABETES MELLITUS, TYPE II (ICD-250.00) Patient did not bring meter today. Will have him continue current regimen. May need prandial insulin. Will discuss on follow up. Patient given lantus pen. Instructed to bring meter on follow up.  His updated medication list for this problem includes:    Lantus 100 Unit/ml Soln (Insulin glargine) ..... Inject 15 units before bedtime.    Glucophage 1000 Mg Tabs (Metformin hcl) .Marland Kitchen... Take 1 tablet by mouth two times a day    Anacin 81 Mg Tbec (Aspirin) .Marland Kitchen... Take 1 tablet by mouth once a day    Prinivil 10  Mg Tabs (Lisinopril) .Marland Kitchen... Take 1/2  tablet by mouth once a day  Orders: T- Capillary Blood Glucose [id number]) T-Hgb A1C (in-house) [id number])  Labs Reviewed: Creat: 1.10 (07/05/2009)     Last Eye Exam: diabetic retinopathy (10/22/2007) Reviewed HgBA1c results: 8.5 (02/16/2010)  8.1 (11/03/2009)  Problem # 2:  HYPERTENSION, BENIGN ESSENTIAL, CONTROLLED (ICD-401.1) Controlled. Continue current regimen.  His updated medication list for this problem includes:    Prinivil 10 Mg Tabs (Lisinopril) .Marland Kitchen... Take 1/2  tablet by mouth once a day  BP today: 114/68 Prior BP: 111/70 (11/03/2009)  Labs Reviewed: K+:  5.0 (07/05/2009) Creat: : 1.10 (07/05/2009)   Chol: 139 (07/05/2009)   HDL: 40 (07/05/2009)   LDL: 77 (07/05/2009)   TG: 109 (07/05/2009)  Problem # 3:  Preventive Health Care (ICD-V70.0) Now that patient has medicare. On follow up with schedule Diabetic Eye exam and Colonoscopy. Received influezna shot today.  Complete Medication List: 1)  Lantus 100 Unit/ml Soln (Insulin glargine) .... Inject 15 units before bedtime. 2)  Relion Mini Pen Needles 31g X 6 Mm Misc (Insulin pen needle) .... Use to inject lantus iinsulin once daily 3)  Glucophage 1000 Mg Tabs (Metformin hcl) .... Take 1 tablet by mouth two times a day 4)  Anacin 81 Mg Tbec (Aspirin) .... Take 1 tablet by mouth once a day 5)  Hydrocortisone 1 % Crea (Hydrocortisone) .... Aplica una capa [place] a cuatro vezes a dia 6)  Pravachol 40 Mg Tabs (Pravastatin sodium) .... Take 1 tablet by mouth once a day 7)  Viagra 50 Mg Tabs (Sildenafil citrate) .... Take one tablet one hour before intercourse 8)  Prinivil 10 Mg Tabs (Lisinopril) .... Take 1/2  tablet by mouth once a day 9)  Bayer Contour Test Strp (Glucose blood) .... Tocar su azucar de sangre antes de desayuno y cena 10)  Lancets Ultra Thin Misc (Lancets) .... Tocar su azucar de sangre antes de desayuno y cena  Other Orders: Influenza Vaccine NON MCR  [id number])  Patient Instructions: 1)  Please schedule a follow-up appointment in 3 months. 2)  Take all medication as directed.   Last LDL:                                                 77 (07/05/2009 6:36:00 PM)          Diabetic Foot Exam Foot Inspection Is there a history of a foot ulcer?              No Is there a foot ulcer now?              No Can the patient see the bottom of their feet?          Yes Are the shoes appropriate in style and fit?          Yes Is there swelling or an abnormal foot shape?          No Are the toenails long?                No Are the toenails thick?                No Are the toenails ingrown?              No Is there heavy callous build-up?              No Is there a claw toe deformity?                          No Is there elevated skin temperature?            No Is there limited ankle dorsiflexion?            No Is there foot or ankle muscle weakness?            No Do you have pain in  calf while walking?           No      Diabetic Foot Care Education :Patient educated on appropriate care of diabetic feet.  Pulse Check          Right Foot          Left Foot Posterior Tibial:        3+            3+ Dorsalis Pedis:        3+            3+ Comments: Small amount of dryness and the start of callus buildup on heel of right foot. High Risk Feet? No   10-g (5.07) Semmes-Weinstein Monofilament Test Performed by: Sander Nephew RN          Right Foot          Left Foot Visual Inspection               Test Control      normal         normal Site 1         normal         normal Site 2         normal         normal Site 3         normal         normal Site 4         normal         normal Site 5         normal         normal Site 6         normal         normal Site 7         normal         normal Site 8         normal         normal Site 9         normal         normal Site 10         normal         normal  Impression      normal          normal    Vital Signs:  Patient profile:   60 year old male Height:      64.5 inches (163.83 cm) Weight:      153.04 pounds (69.56 kg) BMI:     25.96 Temp:     97.1 degrees F (36.17 degrees C) oral Pulse rate:   69 / minute BP sitting:   114 / 68  (right arm) Cuff size:   regular  Vitals Entered By: Sander Nephew RN (February 16, 2010 3:41 PM)   Prevention & Chronic Care Immunizations   Influenza vaccine: Fluvax Non-MCR  (02/16/2010)    Tetanus booster: Not documented    Pneumococcal vaccine: Not documented  Colorectal Screening   Hemoccult: Not documented    Colonoscopy: Not documented   Colonoscopy action/deferral: Refused  (11/03/2009)  Other Screening   PSA: Not documented   Smoking status: never  (02/16/2010)  Diabetes Mellitus   HgbA1C: 8.5  (02/16/2010)    Eye exam: diabetic retinopathy  (10/22/2007)   Diabetic eye exam action/deferral: Refused  (07/05/2009)   Eye exam due: 04/2008    Foot exam: yes  (02/16/2010)  High risk foot: No  (02/16/2010)   Foot care education: Done  (02/16/2010)   Foot exam due: 07/22/2008    Urine microalbumin/creatinine ratio: 6.3  (10/05/2008)    Diabetes flowsheet reviewed?: Yes   Progress toward A1C goal: Deteriorated  Lipids   Total Cholesterol: 139  (07/05/2009)   Lipid panel action/deferral: Lipid Panel ordered   LDL: 77  (07/05/2009)   LDL Direct: Not documented   HDL: 40  (07/05/2009)   Triglycerides: 109  (07/05/2009)    SGOT (AST): 11  (07/05/2009)   BMP action: Ordered   SGPT (ALT): 11  (07/05/2009)   Alkaline phosphatase: 71  (07/05/2009)   Total bilirubin: 0.3  (07/05/2009)    Lipid flowsheet reviewed?: Yes   Progress toward LDL goal: At goal  Hypertension   Last Blood Pressure: 114 / 68  (02/16/2010)   Serum creatinine: 1.10  (07/05/2009)   BMP action: Ordered   Serum potassium 5.0  (07/05/2009)    Hypertension flowsheet reviewed?: Yes   Progress toward BP goal: At goal  Self-Management  Support :   Personal Goals (by the next clinic visit) :     Personal A1C goal: 7  (07/05/2009)     Personal blood pressure goal: 130/80  (07/05/2009)     Personal LDL goal: 100  (07/05/2009)    Patient will work on the following items until the next clinic visit to reach self-care goals:     Medications and monitoring: take my medicines every day, bring all of my medications to every visit, examine my feet every day  (02/16/2010)     Eating: drink diet soda or water instead of juice or soda, eat more vegetables, use fresh or frozen vegetables, eat foods that are low in salt, eat baked foods instead of fried foods, eat fruit for snacks and desserts, limit or avoid alcohol  (02/16/2010)     Activity: take a 30 minute walk every day, take the stairs instead of the elevator  (02/16/2010)    Diabetes self-management support: Written self-care plan, Education handout, Pre-printed educational material, Resources for patients handout  (02/16/2010)   Diabetes care plan printed   Diabetes education handout printed   Last diabetes self-management training by diabetes educator: 11/01/2008    Hypertension self-management support: Written self-care plan, Education handout, Pre-printed educational material, Resources for patients handout  (02/16/2010)   Hypertension self-care plan printed.   Hypertension education handout printed    Lipid self-management support: Written self-care plan, Education handout, Pre-printed educational material, Resources for patients handout  (02/16/2010)   Lipid self-care plan printed.   Lipid education handout printed      Resource handout printed.     Immunizations Administered:  Influenza Vaccine # 1:    Vaccine Type: Fluvax Non-MCR    Site: right deltoid    Mfr: Gl[employer]oSmithK[employer]    Dose: 0.5 ml    Route: IM    Given by: Sander Nephew RN    Exp. Date: 11/11/2010    Lot #: [id number]    VIS given: 12/06/09 version given February 16, 2010.  Flu Vaccine Consent  Questions:    Do you have a history of severe allergic reactions to this vaccine? no    Any prior history of allergic reactions to egg and/or gelatin? no    Do you have a sensitivity to the preservative Thimersol? no    Do you have a past history of Guillan-Barre Syndrome? no    Do you currently have an acute febrile illness?  no    Have you ever had a severe reaction to latex? no    Vaccine information given and explained to patient? yes  Laboratory Results   Blood Tests   Date/Time Received: February 16, 2010 4:22 PM. Date/Time Reported: Maryan Rued  February 16, 2010 4:23 PM    HGBA1C: 8.5%   (Normal Range: Non-Diabetic - 3-6%   Control Diabetic - 6-8%) CBG Random:: 259mg /dL

## 2010-06-13 NOTE — Letter (Signed)
Summary: BLOOD GLUCOSE/08-03-2009-11-03-2009  BLOOD GLUCOSE/08-03-2009-11-03-2009   Imported By: Garlan Fillers 11/17/2009 11:32:34  _____________________________________________________________________  External Attachment:    Type:   Image     Comment:   External Document

## 2010-06-13 NOTE — Letter (Signed)
Summary: [facility]   Imported By: Enedina Finner 02/17/2010 13:43:41  _____________________________________________________________________  External Attachment:    Type:   Image     Comment:   External Document

## 2010-06-13 NOTE — Letter (Signed)
Summary: [facility]   Imported By: Bonner Puna 06/23/2009 15:28:51  _____________________________________________________________________  External Attachment:    Type:   Image     Comment:   External Document

## 2010-06-13 NOTE — Assessment & Plan Note (Signed)
Summary: est-ck/fu/meds/cfb   Vital Signs:  Patient profile:   60 year old male Height:      64.5 inches (163.83 cm) Weight:      152.04 pounds (69.11 kg) BMI:     25.79 Temp:     97.1 degrees F (36.17 degrees C) oral Pulse rate:   76 / minute BP sitting:   126 / 76  (right arm) Cuff size:   regular  Vitals Entered By: Sander Nephew RN (July 05, 2009 9:50 AM) Is Patient Diabetic? Yes Did you bring your meter with you today? Yes Pain Assessment Patient in pain? yes     Location: behind left ear, knees Intensity: 6 Type: aching Onset of pain  Constant Nutritional Status BMI of 25 - 29 = overweight CBG Result 129  Have you ever been in a relationship where you felt threatened, hurt or afraid?No   Does patient need assistance? Functional Status Self care Ambulation Normal Comments Check up   Primary Care Provider:  Rudie Meyer MD   History of Present Illness: 60 yr old man with pmhx as described below comes to the clinic for follow up. Patient has no complains. Patient reports that he decreased his dose of lantus to 15 units because he was feeling tremors and weakness. Since decreasing his dose to he reports that most morning blood glucose levels have been below 120 but above 70.    Depression History:      The patient denies a depressed mood most of the day and a diminished interest in his usual daily activities.         Problems Prior to Update: 1)  Preventive Health Care  (ICD-V70.0) 2)  Back Pain, Lumbar  (ICD-724.2) 3)  Hypertension, Benign Essential, Controlled  (ICD-401.1) 4)  Knee Pain  (ICD-719.46) 5)  Acute Gastritis Without Mention of Hemorrhage  (ICD-535.00) 6)  Knee Pain, Bilateral  (ICD-719.46) 7)  Diabetic Peripheral Neuropathy  (ICD-250.60) 8)  Pruritus  (ICD-698.9) 9)  Erectile Dysfunction  (ICD-302.72) 10)  Meralgia Paresthetica  (ICD-355.1) 11)  Diabetes Mellitus, Type II  (ICD-250.00) 12)  Dyslipidemia  (ICD-272.4)  Medications  Prior to Update: 1)  Lantus 100 Unit/ml Soln (Insulin Glargine) .... Inject 19 Units Before Bedtime. 2)  Relion Mini Pen Needles 31g X 6 Mm  Misc (Insulin Pen Needle) .... Use To Inject Lantus Iinsulin Once Daily 3)  Glucophage 1000 Mg  Tabs (Metformin Hcl) .... Take 1 Tablet By Mouth Two Times A Day 4)  Amitriptyline Hcl 25 Mg  Tabs (Amitriptyline Hcl) .... Take One Tablet At Bedtime 5)  Anacin 81 Mg Tbec (Aspirin) .... Take 1 Tablet By Mouth Once A Day 6)  Hydrocortisone 1 % Crea (Hydrocortisone) .... Aplica [place] Dos A Cuatro Vezes A Dia 7)  Pravachol 40 Mg Tabs (Pravastatin Sodium) .... Take 1 Tablet By Mouth Once A Day 8)  Viagra 50 Mg Tabs (Sildenafil Citrate) .... Take One Tablet One Hour Before Intercourse 9)  Prinivil 10 Mg Tabs (Lisinopril) .... Take 1/2  Tablet By Mouth Once A Day 10)  Bayer Contour Test  Strp (Glucose Blood) .... Tocar Su [place] 11)  Lancets Ultra Thin  Misc (Lancets) .... Tocar Su Az[place] 12)  Patanol 0.1 % Soln (Olopatadine Hcl) .... Place One Drop in Each Eye Twice A Day  Current Medications (verified): 1)  Lantus 100 Unit/ml Soln (Insulin Glargine) .... Inject  15 Units Before Bedtime. 2)  Relion Mini Pen Needles 31g X 6 Mm  Misc (Insulin Pen Needle) .... Use To Inject Lantus Iinsulin Once Daily 3)  Glucophage 1000 Mg  Tabs (Metformin Hcl) .... Take 1 Tablet By Mouth Two Times A Day 4)  Amitriptyline Hcl 25 Mg  Tabs (Amitriptyline Hcl) .... Take One Tablet At Bedtime 5)  Anacin 81 Mg Tbec (Aspirin) .... Take 1 Tablet By Mouth Once A Day 6)  Hydrocortisone 1 % Crea (Hydrocortisone) .... Aplica [place] Dos A Cuatro Vezes A Dia 7)  Pravachol 40 Mg Tabs (Pravastatin Sodium) .... Take 1 Tablet By Mouth Once A Day 8)  Viagra 50 Mg Tabs (Sildenafil Citrate) .... Take One Tablet One Hour Before Intercourse 9)  Prinivil 10 Mg Tabs (Lisinopril)  .... Take 1/2  Tablet By Mouth Once A Day 10)  Bayer Contour Test  Strp (Glucose Blood) .... Tocar Su Azucar [place] 11)  Lancets Ultra Thin  Misc (Lancets) .... Tocar Su Azuc[place] 12)  Patanol 0.1 % Soln (Olopatadine Hcl) .... Place One Drop in Each Eye Twice A Day  Allergies: No Known Drug Allergies  Past History:  Past Medical History: Last updated: 06/15/2008 Diabetes mellitus, type II - Dx 1999 Hyperlipidemia Hypertension Peripheral neuropathy- secondary to DM Hx of tobacco use  Social History: Last updated: 03/10/2008 No hx of ETOH, smoking, or illegal drugs.  Used to work for [employer].   Risk Factors: Caffeine Use: 1 (10/19/2008) Exercise: no (10/19/2008)  Risk Factors: Smoking Status: never (03/30/2009)  Social History: Reviewed history from 03/10/2008 and no changes required. No hx of ETOH, smoking, or illegal drugs.  Used to work for [employer].   Review of Systems  The patient denies fever, chest pain, dyspnea on exertion, peripheral edema, headaches, hemoptysis, abdominal pain, melena, hematochezia, severe indigestion/heartburn, muscle weakness, and difficulty walking.    Physical Exam  General:  alert, well-developed, and well-nourished.   Ears:  R ear normal and L ear normal.   Mouth:  MMM Neck:  supple.   Lungs:  normal respiratory effort, no intercostal retractions, no accessory muscle use, and normal breath sounds.   Heart:  normal rate and regular rhythm.   Abdomen:  soft, non-tender, normal bowel sounds, and no distention.   Msk:  normal ROM.   Extremities:  no edema. Neurologic:  alert & oriented X3, cranial nerves II-XII intact, and strength normal in all extremities.     Impression & Recommendations:  Problem # 1:  DIABETES MELLITUS, TYPE II (ICD-250.00) Not at goal. HgA1c has slightly improved. Meter reviewed> fasting blood glucose levels range 66-150. Due to  hypoglycemic events will continue to have patient inject 15 units of lantus at bedtime. Patient was instructed to decrease carbohydrate consumption and start to exercise regularly. Patient refused to have ordered diabetic eye exam until he gets medicaid which he reports to have by next appointment.   His updated medication list for this problem includes:    Lantus 100 Unit/ml Soln (Insulin glargine) ..... Inject 15 units before bedtime.    Glucophage 1000 Mg Tabs (Metformin hcl) .Marland Kitchen... Take 1 tablet by mouth two times a day    Anacin 81 Mg Tbec (Aspirin) .Marland Kitchen... Take 1 tablet by mouth once a day    Prinivil 10 Mg Tabs (Lisinopril) .Marland Kitchen... Take 1/2  tablet by mouth once a day  Orders: T-Hgb A1C (in-house) (  [id number])  Labs Reviewed: Creat: 1.15 (10/19/2008)     Last Eye Exam: diabetic retinopathy (10/22/2007) Reviewed HgBA1c results: 8.5 (07/05/2009)  8.7 (03/30/2009)  Problem # 2:  HYPERTENSION, BENIGN ESSENTIAL, CONTROLLED (ICD-401.1) AT goal. Continue current regimen.  His updated medication list for this problem includes:    Prinivil 10 Mg Tabs (Lisinopril) .Marland Kitchen... Take 1/2  tablet by mouth once a day  BP today: 126/76 Prior BP: 107/69 (03/30/2009)  Labs Reviewed: K+: 4.6 (10/19/2008) Creat: : 1.15 (10/19/2008)   Chol: 150 (10/05/2008)   HDL: 36 (10/05/2008)   LDL: 81 (10/05/2008)   TG: 167 (10/05/2008)  Problem # 3:  DYSLIPIDEMIA (ICD-272.4) Recheck FLP, cmet and reasses.  His updated medication list for this problem includes:    Pravachol 40 Mg Tabs (Pravastatin sodium) .Marland Kitchen... Take 1 tablet by mouth once a day  Orders: T-Lipid Profile [id number])  Labs Reviewed: SGOT: 12 (10/19/2008)   SGPT: 11 (10/19/2008)   HDL:36 (10/05/2008), 49 (06/15/2008)  LDL:81 (10/05/2008), 126 (06/15/2008)  Chol:150 (10/05/2008), 202 (06/15/2008)  Trig:167 (10/05/2008), 133 (06/15/2008)  Problem # 4:  [place] (ICD-V70.0) Patient has never had colonoscopy. Refused referral for  colonoscopy until he receives medicare card. Hemoccult card done 05/10 were negative. Will screen for anemia.   Complete Medication List: 1)  Lantus 100 Unit/ml Soln (Insulin glargine) .... Inject 15 units before bedtime. 2)  Relion Mini Pen Needles 31g X 6 Mm Misc (Insulin pen needle) .... Use to inject lantus iinsulin once daily 3)  Glucophage 1000 Mg Tabs (Metformin hcl) .... Take 1 tablet by mouth two times a day 4)  Amitriptyline Hcl 25 Mg Tabs (Amitriptyline hcl) .... Take one tablet at bedtime 5)  Anacin 81 Mg Tbec (Aspirin) .... Take 1 tablet by mouth once a day 6)  Hydrocortisone 1 % Crea (Hydrocortisone) .... Aplica una capa fin[address] [place] a cuatro vezes a dia 7)  Pravachol 40 Mg Tabs (Pravastatin sodium) .... Take 1 tablet by mouth once a day 8)  Viagra 50 Mg Tabs (Sildenafil citrate) .... Take one tablet one hour before intercourse 9)  Prinivil 10 Mg Tabs (Lisinopril) .... Take 1/2  tablet by mouth once a day 10)  Bayer Contour Test Strp (Glucose blood) .... Tocar su azucar de sangre antes de desayuno y cena 11)  Lancets Ultra Thin Misc (Lancets) .... Tocar su azucar de sangre antes de desayuno y cena 12)  Patanol 0.1 % Soln (Olopatadine hcl) .... Place one drop in each eye twice a day  Other Orders: T- Capillary Blood Glucose [id number]) T-Comprehensive Metabolic Panel ([id number]) T-CBC No Diff [id number])  Patient Instructions: 1)  Please schedule a follow-up appointment in 3 months. 2)  Take all medication as directed. 3)  You will be called with any abnormalities in the tests scheduled or performed today.  If you don't hear from [place] within a week from when the test was performed, you can assume that your test was normal.   Prevention & Chronic Care Immunizations   Influenza vaccine: Fluvax 3+  (03/10/2008)    Tetanus booster: Not documented    Pneumococcal vaccine: Not documented  Colorectal Screening   Hemoccult: Not documented    Colonoscopy: Not  documented  Other Screening   PSA: Not documented   Smoking status: never  (03/30/2009)  Diabetes Mellitus   HgbA1C: 8.5  (07/05/2009)    Eye exam: diabetic retinopathy  (10/22/2007)   Diabetic eye exam action/deferral: Refused  (07/05/2009)   Eye exam due: 04/2008  Foot exam: yes  (10/19/2008)   High risk foot: No  (07/23/2007)   Foot care education: Not documented   Foot exam due: 07/22/2008    Urine microalbumin/creatinine ratio: 6.3  (10/05/2008)    Diabetes flowsheet reviewed?: Yes   Progress toward A1C goal: Deteriorated  Lipids   Total Cholesterol: 150  (10/05/2008)   Lipid panel action/deferral: Lipid Panel ordered   LDL: 81  (10/05/2008)   LDL Direct: Not documented   HDL: 36  (10/05/2008)   Triglycerides: 167  (10/05/2008)    SGOT (AST): 12  (10/19/2008)   BMP action: Ordered   SGPT (ALT): 11  (10/19/2008) CMP ordered    Alkaline phosphatase: 68  (10/19/2008)   Total bilirubin: 0.2  (10/19/2008)    Lipid flowsheet reviewed?: Yes   Progress toward LDL goal: At goal  Hypertension   Last Blood Pressure: 126 / 76  (07/05/2009)   Serum creatinine: 1.15  (10/19/2008)   BMP action: Ordered   Serum potassium 4.6  (10/19/2008) CMP ordered     Hypertension flowsheet reviewed?: Yes   Progress toward BP goal: At goal  Self-Management Support :   Personal Goals (by the next clinic visit) :     Personal A1C goal: 7  (07/05/2009)     Personal blood pressure goal: 130/80  (07/05/2009)     Personal LDL goal: 100  (07/05/2009)    Patient will work on the following items until the next clinic visit to reach self-care goals:     Medications and monitoring: take my medicines every day, check my blood sugar, bring all of my medications to every visit, examine my feet every day  (07/05/2009)     Eating: drink diet soda or water instead of juice or soda, eat more vegetables, eat foods that are low in salt, eat fruit for snacks and desserts  (07/05/2009)     Activity:  take a 30 minute walk every day  (07/05/2009)    Diabetes self-management support: Written self-care plan  (07/05/2009)   Diabetes care plan printed   Last diabetes self-management training by diabetes educator: 11/01/2008    Hypertension self-management support: Written self-care plan  (07/05/2009)   Hypertension self-care plan printed.    Lipid self-management support: Written self-care plan  (07/05/2009)   Lipid self-care plan printed.  Process Orders Check Orders Results:     [facility]: [id number] not required for this insurance Tests Sent for requisitioning (July 05, 2009 2:39 PM):     07/05/2009: [facility] -- T-Lipid Profile [phone] (signed)     07/05/2009: [facility] -- T-Comprehensive Metabolic Panel [id number] (signed)     07/05/2009: [facility] -- T-CBC No Diff [id number] (signed)    Laboratory Results   Blood Tests   Date/Time Received: July 05, 2009 11:02 AM Date/Time Reported: Maryan Rued  July 05, 2009 11:02 AM  HGBA1C: 8.5%   (Normal Range: Non-Diabetic - 3-6%   Control Diabetic - 6-8%) CBG Random:: 129mg /dL

## 2010-06-13 NOTE — Assessment & Plan Note (Signed)
Summary: EST/NOT HFU/3 MONTH F/U APPT/CH   Vital Signs:  Patient profile:   60 year old male Height:      64.5 inches (163.83 cm) Weight:      154.4 pounds (69.11 kg) BMI:     25.79 Temp:     97.7 degrees F (36.50 degrees C) oral Pulse rate:   71 / minute BP sitting:   111 / 70  (right arm) Cuff size:   regular  Vitals Entered By: Lucky Rathke NT II (November 03, 2009 3:12 PM) CC: PATIENT IS HERE FOR ROUTINE OFFCIE VISIT  /  MEDICATION REFILL /  Is Patient Diabetic? Yes Did you bring your meter with you today? Yes Pain Assessment Patient in pain? no      Nutritional Status BMI of 25 - 29 = overweight CBG Result 310  Have you ever been in a relationship where you felt threatened, hurt or afraid?No   Does patient need assistance? Functional Status Self care Ambulation Normal Comments PATIENT IS HERE FOR ROUTINE OFFICE VISIT /  MEDICATION REFILL /    Primary Care Provider:  Rudie Meyer MD  CC:  PATIENT IS HERE FOR ROUTINE OFFCIE VISIT  /  MEDICATION REFILL / .  History of Present Illness: 59 yr old man with pmhx as described below comes to the clinic for follow up. Patient reports to have some blurry vision for a couple of months now. Denies focal deficits, gait abnormalities, or slurred speech.  He did not get approved for Medicaid, but would like to meet with Clarice Pole to get orange card. Denies hypoglycemic events.   Preventive Screening-Counseling & Management  Alcohol-Tobacco     Alcohol type: occasionally     Smoking Status: never     Year Quit: quit smoking  8 months ago  Caffeine-Diet-Exercise     Caffeine use/day: 1     Does Patient Exercise: no     Type of exercise: walking  Problems Prior to Update: 1)  Preventive Health Care  (ICD-V70.0) 2)  Back Pain, Lumbar  (ICD-724.2) 3)  Hypertension, Benign Essential, Controlled  (ICD-401.1) 4)  Knee Pain  (ICD-719.46) 5)  Acute Gastritis Without Mention of Hemorrhage  (ICD-535.00) 6)  Knee Pain,  Bilateral  (ICD-719.46) 7)  Diabetic Peripheral Neuropathy  (ICD-250.60) 8)  Pruritus  (ICD-698.9) 9)  Erectile Dysfunction  (ICD-302.72) 10)  Meralgia Paresthetica  (ICD-355.1) 11)  Diabetes Mellitus, Type II  (ICD-250.00) 12)  Dyslipidemia  (ICD-272.4)  Medications Prior to Update: 1)  Lantus 100 Unit/ml Soln (Insulin Glargine) .... Inject 15 Units Before Bedtime. 2)  Relion Mini Pen Needles 31g X 6 Mm  Misc (Insulin Pen Needle) .... Use To Inject Lantus Iinsulin Once Daily 3)  Glucophage 1000 Mg  Tabs (Metformin Hcl) .... Take 1 Tablet By Mouth Two Times A Day 4)  Amitriptyline Hcl 25 Mg  Tabs (Amitriptyline Hcl) .... Take One Tablet At Bedtime 5)  Anacin 81 Mg Tbec (Aspirin) .... Take 1 Tablet By Mouth Once A Day 6)  Hydrocortisone 1 % Crea (Hydrocortisone) .... Aplica [place] Dos A Cuatro Vezes A Dia 7)  Pravachol 40 Mg Tabs (Pravastatin Sodium) .... Take 1 Tablet By Mouth Once A Day 8)  Viagra 50 Mg Tabs (Sildenafil Citrate) .... Take One Tablet One Hour Before Intercourse 9)  Prinivil 10 Mg Tabs (Lisinopril) .... Take 1/2  Tablet By Mouth Once A Day 10)  Bayer Contour Test  Strp (Glucose Blood) .... Tocar Kasandra Knudsen Azucar  [place] 11)  Lancets Ultra Thin  Misc (Lancets) .... Tocar Su Azucar [place] 12)  Patanol 0.1 % Soln (Olopatadine Hcl) .... Place One Drop in Each Eye Twice A Day  Current Medications (verified): 1)  Lantus 100 Unit/ml Soln (Insulin Glargine) .... Inject 15 Units Before Bedtime. 2)  Relion Mini Pen Needles 31g X 6 Mm  Misc (Insulin Pen Needle) .... Use To Inject Lantus Iinsulin Once Daily 3)  Glucophage 1000 Mg  Tabs (Metformin Hcl) .... Take 1 Tablet By Mouth Two Times A Day 4)  Anacin 81 Mg Tbec (Aspirin) .... Take 1 Tablet By Mouth Once A Day 5)  Hydrocortisone 1 % Crea (Hydrocortisone) .... Aplica [place] Dos A Cuatro Vezes A Dia 6)  Pravachol 40 Mg Tabs  (Pravastatin Sodium) .... Take 1 Tablet By Mouth Once A Day 7)  Viagra 50 Mg Tabs (Sildenafil Citrate) .... Take One Tablet One Hour Before Intercourse 8)  Prinivil 10 Mg Tabs (Lisinopril) .... Take 1/2  Tablet By Mouth Once A Day 9)  Bayer Contour Test  Strp (Glucose Blood) .... Tocar Su Azucar [place] 10)  Lancets Ultra Thin  Misc (Lancets) .... Tocar Su Azucar [place]  Allergies: No Known Drug Allergies  Past History:  Past Medical History: Last updated: 06/15/2008 Diabetes mellitus, type II - Dx 1999 Hyperlipidemia Hypertension Peripheral neuropathy- secondary to DM Hx of tobacco use  Social History: Last updated: 03/10/2008 No hx of ETOH, smoking, or illegal drugs.  Used to work for [employer].   Risk Factors: Caffeine Use: 1 (11/03/2009) Exercise: no (11/03/2009)  Risk Factors: Smoking Status: never (11/03/2009)  Social History: Reviewed history from 03/10/2008 and no changes required. No hx of ETOH, smoking, or illegal drugs.  Used to work for [employer].   Review of Systems  The patient denies fever, chest pain, dyspnea on exertion, peripheral edema, prolonged cough, headaches, hemoptysis, abdominal pain, melena, hematochezia, hematuria, and difficulty walking.    Physical Exam  General:  NAD Mouth:  MMM Lungs:  normal respiratory effort, no intercostal retractions, no accessory muscle use, and normal breath sounds.   Heart:  normal rate and regular rhythm.   Abdomen:  soft, non-tender, normal bowel sounds, and no distention.   Msk:  normal ROM.   Extremities:  no edema. Neurologic:  alert & oriented X3, cranial nerves II-XII intact, and strength normal in all extremities.     Impression & Recommendations:  Problem # 1:  DIABETES MELLITUS, TYPE II (ICD-250.00) Better control, but still not at goal. Patient reports that he does not have the money to check sugars after meals which may help me  evaluate if patient is having elevated post prandial levels. Patient is resistant to the thought of starting short acting insulin for meal coverage because of financial concerns. Reports that lantus is very expensive. I have given him a vial of lantus today. Will consider changing to Novolog 70/30 if needed. Will have him meet with Clarice Pole to see if he qualifies for the orange card as he is in need of Diabetic Eye Exam.   His updated medication list for this problem includes:    Lantus 100 Unit/ml Soln (Insulin glargine) ..... Inject 15 units before bedtime.    Glucophage 1000 Mg Tabs (Metformin hcl) .Marland Kitchen... Take 1 tablet by mouth two times a day  Anacin 81 Mg Tbec (Aspirin) .Marland Kitchen... Take 1 tablet by mouth once a day    Prinivil 10 Mg Tabs (Lisinopril) .Marland Kitchen... Take 1/2  tablet by mouth once a day  Orders: T- Capillary Blood Glucose [id number]) T-Hgb A1C (in-house) [id number])  Labs Reviewed: Creat: 1.10 (07/05/2009)     Last Eye Exam: diabetic retinopathy (10/22/2007) Reviewed HgBA1c results: 8.1 (11/03/2009)  8.5 (07/05/2009)  Problem # 2:  HYPERTENSION, BENIGN ESSENTIAL, CONTROLLED (ICD-401.1) At goal. No change to medication.  His updated medication list for this problem includes:    Prinivil 10 Mg Tabs (Lisinopril) .Marland Kitchen... Take 1/2  tablet by mouth once a day  BP today: 111/70 Prior BP: 126/76 (07/05/2009)  Labs Reviewed: K+: 5.0 (07/05/2009) Creat: : 1.10 (07/05/2009)   Chol: 139 (07/05/2009)   HDL: 40 (07/05/2009)   LDL: 77 (07/05/2009)   TG: 109 (07/05/2009)  Problem # 3:  DYSLIPIDEMIA (ICD-272.4) At goal. Continue current regimen.  His updated medication list for this problem includes:    Pravachol 40 Mg Tabs (Pravastatin sodium) .Marland Kitchen... Take 1 tablet by mouth once a day  Labs Reviewed: SGOT: 11 (07/05/2009)   SGPT: 11 (07/05/2009)   HDL:40 (07/05/2009), 36 (10/05/2008)  LDL:77 (07/05/2009), 81 (10/05/2008)  Chol:139 (07/05/2009), 150 (10/05/2008)  Trig:109 (07/05/2009), 167  (10/05/2008)  Problem # 4:  Preventive Health Care (ICD-V70.0) Patient refused GI referral for colonoscopy until he gets orange card.  Complete Medication List: 1)  Lantus 100 Unit/ml Soln (Insulin glargine) .... Inject 15 units before bedtime. 2)  Relion Mini Pen Needles 31g X 6 Mm Misc (Insulin pen needle) .... Use to inject lantus iinsulin once daily 3)  Glucophage 1000 Mg Tabs (Metformin hcl) .... Take 1 tablet by mouth two times a day 4)  Anacin 81 Mg Tbec (Aspirin) .... Take 1 tablet by mouth once a day 5)  Hydrocortisone 1 % Crea (Hydrocortisone) .... Aplica una capa [place] a cuatro vezes a dia 6)  Pravachol 40 Mg Tabs (Pravastatin sodium) .... Take 1 tablet by mouth once a day 7)  Viagra 50 Mg Tabs (Sildenafil citrate) .... Take one tablet one hour before intercourse 8)  Prinivil 10 Mg Tabs (Lisinopril) .... Take 1/2  tablet by mouth once a day 9)  Bayer Contour Test Strp (Glucose blood) .... Tocar su azucar de sangre antes de desayuno y cena 10)  Lancets Ultra Thin Misc (Lancets) .... Tocar su azucar de sangre antes de desayuno y cena  Patient Instructions: 1)  Regrese en tres meses para IT consultant y para poder hacer la visita a Brewing technologist de [place] ojos. 2)  Please schedule a follow-up appointment in 3 months. 3)  Please set up an appointment with Clarice Pole to see if patient qualifies for orange card. Will need Patent attorney. 4)  Take all medication as directed. 5)  Check your blood sugars regularly. If your readings are usually above :300 or below 70 you should contact our office. 6)  Check your feet each night for sore areas, calluses or signs of infection. Prescriptions: PRINIVIL 10 MG TABS (LISINOPRIL) Take 1/2  tablet by mouth once a day  #30 x 6   Entered and Authorized by:   Rudie Meyer MD   Signed by:   Rudie Meyer MD on 11/03/2009   Method used:   Print then Give to Patient   RxID:   [id number] PRAVACHOL 40 MG  TABS (PRAVASTATIN SODIUM) Take 1 tablet by mouth once a day  #31 x 6  Entered and Authorized by:   Rudie Meyer MD   Signed by:   Rudie Meyer MD on 11/03/2009   Method used:   Print then Give to Patient   RxID:   [id number] GLUCOPHAGE 1000 MG  TABS (METFORMIN HCL) Take 1 tablet by mouth two times a day  #60 Each x 6   Entered and Authorized by:   Rudie Meyer MD   Signed by:   Rudie Meyer MD on 11/03/2009   Method used:   Print then Give to Patient   RxID:   [id number] LANTUS 100 UNIT/ML SOLN (INSULIN GLARGINE) inject 15 units before bedtime.  #1 vial x 6   Entered and Authorized by:   Rudie Meyer MD   Signed by:   Rudie Meyer MD on 11/03/2009   Method used:   Print then Give to Patient   RxID:   [id number]    Laboratory Results   Blood Tests   Date/Time Received: November 03, 2009 3:35 PM Date/Time Reported: Maryan Rued  November 03, 2009 3:35 PM   HGBA1C: 8.1%   (Normal Range: Non-Diabetic - 3-6%   Control Diabetic - 6-8%) CBG Random:: 310mg /dL     Prevention & Chronic Care Immunizations   Influenza vaccine: Fluvax 3+  (03/10/2008)    Tetanus booster: Not documented    Pneumococcal vaccine: Not documented  Colorectal Screening   Hemoccult: Not documented    Colonoscopy: Not documented   Colonoscopy action/deferral: Refused  (11/03/2009)  Other Screening   PSA: Not documented   Smoking status: never  (11/03/2009)  Diabetes Mellitus   HgbA1C: 8.1  (11/03/2009)    Eye exam: diabetic retinopathy  (10/22/2007)   Diabetic eye exam action/deferral: Refused  (07/05/2009)   Eye exam due: 04/2008    Foot exam: yes  (10/19/2008)   High risk foot: No  (07/23/2007)   Foot care education: Not documented   Foot exam due: 07/22/2008    Urine microalbumin/creatinine ratio: 6.3  (10/05/2008)    Diabetes flowsheet reviewed?: Yes   Progress toward A1C goal: Improved  Lipids   Total  Cholesterol: 139  (07/05/2009)   Lipid panel action/deferral: Lipid Panel ordered   LDL: 77  (07/05/2009)   LDL Direct: Not documented   HDL: 40  (07/05/2009)   Triglycerides: 109  (07/05/2009)    SGOT (AST): 11  (07/05/2009)   BMP action: Ordered   SGPT (ALT): 11  (07/05/2009)   Alkaline phosphatase: 71  (07/05/2009)   Total bilirubin: 0.3  (07/05/2009)    Lipid flowsheet reviewed?: Yes   Progress toward LDL goal: At goal  Hypertension   Last Blood Pressure: 111 / 70  (11/03/2009)   Serum creatinine: 1.10  (07/05/2009)   BMP action: Ordered   Serum potassium 5.0  (07/05/2009)    Hypertension flowsheet reviewed?: Yes   Progress toward BP goal: At goal  Self-Management Support :   Personal Goals (by the next clinic visit) :     Personal A1C goal: 7  (07/05/2009)     Personal blood pressure goal: 130/80  (07/05/2009)     Personal LDL goal: 100  (07/05/2009)    Diabetes self-management support: Written self-care plan  (11/03/2009)   Diabetes care plan printed   Last diabetes self-management training by diabetes educator: 11/01/2008    Hypertension self-management support: Written self-care plan  (11/03/2009)   Hypertension self-care plan printed.    Lipid self-management support: Written self-care plan  (11/03/2009)   Lipid self-care plan printed.

## 2010-06-13 NOTE — Letter (Signed)
Summary: DOWN METER  DOWN METER   Imported By: Garlan Fillers 08/22/2009 11:33:36  _____________________________________________________________________  External Attachment:    Type:   Image     Comment:   External Document

## 2010-06-15 NOTE — Progress Notes (Signed)
  Phone Note Outgoing Call   Summary of Call: Called pt. using interpreter and he will come in with his Medicaid/Medicare???? paperwork so I can review it and help him complete it.   Called SW so that [place] can interpret due to Spanish speaking.

## 2010-06-15 NOTE — Assessment & Plan Note (Signed)
Summary: ACUTE/VEGA/NEEDS LETTER FOR MEDICAID/CH   Vital Signs:  Patient profile:   60 year old male Height:      64.5 inches (163.83 cm) Weight:      158.3 pounds (69.56 kg) BMI:     25.96 Temp:     97.8 degrees F (36.56 degrees C) oral Pulse rate:   71 / minute BP sitting:   136 / 73  (left arm) Cuff size:   regular  Vitals Entered By: Lucky Rathke NT II (May 25, 2010 1:39 PM) CC: MEDICATION REFILL / INSULIN Is Patient Diabetic? Yes Did you bring your meter with you today? Yes Pain Assessment Patient in pain? yes     Location: RIGHT LEG Onset of pain  Chronic Nutritional Status BMI of 25 - 29 = overweight CBG Result 204  Have you ever been in a relationship where you felt threatened, hurt or afraid?No   Does patient need assistance? Functional Status Self care Ambulation Normal   Primary Care Provider:  Rudie Meyer MD  CC:  MEDICATION REFILL / INSULIN.  History of Present Illness: Patient is here to refill his medications. Denies any concerns besides not being able to pay for his Rx.  Preventive Screening-Counseling & Management  Alcohol-Tobacco     Alcohol type: occasionally     Smoking Status: never     Year Quit: quit smoking  8 months ago  Caffeine-Diet-Exercise     Caffeine use/day: 1     Does Patient Exercise: no     Type of exercise: walking  Current Medications (verified): 1)  Lantus 100 Unit/ml Soln (Insulin Glargine) .... Inject 15 Units Before Bedtime. 2)  Relion Mini Pen Needles 31g X 6 Mm  Misc (Insulin Pen Needle) .... Use To Inject Lantus Iinsulin Once Daily 3)  Glucophage 1000 Mg  Tabs (Metformin Hcl) .... Take 1 Tablet By Mouth Two Times A Day 4)  Anacin 81 Mg Tbec (Aspirin) .... Take 1 Tablet By Mouth Once A Day 5)  Hydrocortisone 1 % Crea (Hydrocortisone) .... Aplica Una Cap[place] Dos A Cuatro Vezes A Dia 6)  Pravachol 40 Mg Tabs (Pravastatin Sodium) .... Take 1 Tablet By Mouth Once A Day 7)   Viagra 50 Mg Tabs (Sildenafil Citrate) .... Take One Tablet One Hour Before Intercourse 8)  Prinivil 10 Mg Tabs (Lisinopril) .... Take 1/2  Tablet By Mouth Once A Day 9)  Bayer Contour Test  Strp (Glucose Blood) .... Tocar Su Azucar De Sang[place] Antes De Desayuno Y Cena 10)  Lancets Ultra Thin  Misc (Lancets) .... Tocar Su Azucar De Sang[place] Antes De Desayuno Y Cena  Allergies (verified): No Known Drug Allergies  Past History:  Past medical, surgical, family and social histories (including risk factors) reviewed, and no changes noted (except as noted below).  Past Medical History: Reviewed history from 06/15/2008 and no changes required. Diabetes mellitus, type II - Dx 1999 Hyperlipidemia Hypertension Peripheral neuropathy- secondary to DM Hx of tobacco use  Family History: Reviewed history and no changes required.  Social History: Reviewed history from 03/10/2008 and no changes required. No hx of ETOH, smoking, or illegal drugs.  Used to work for [employer].   Physical Exam  General:  NAD Head:  normocephalic, atraumatic, and no abnormalities observed.   Ears:  R ear normal and L ear normal.   Nose:  no external deformity.   Mouth:  MMM Neck:  supple.   Lungs:  normal respiratory effort, no intercostal retractions, no  accessory muscle use, and normal breath sounds.   Heart:  normal rate and regular rhythm.   Abdomen:  soft, non-tender, normal bowel sounds, and no distention.   Msk:  normal ROM.   Pulses:  2+ pulses throughout Extremities:  no edema. Neurologic:  alert & oriented X3, cranial nerves II-XII intact, and strength normal in all extremities.   Skin:  Intact without suspicious lesions or rashes Psych:  Cognition and judgment appear intact. Alert and cooperative with normal attention span and concentration. No apparent delusions, illusions, hallucinations  Diabetes Management Exam:    Foot Exam (with socks and/or shoes not present):        Sensory-Pinprick/Light touch:          Left medial foot (L-4): diminished          Left dorsal foot (L-5): diminished          Left lateral foot (S-1): diminished          Right medial foot (L-4): diminished          Right dorsal foot (L-5): diminished          Right lateral foot (S-1): diminished       Sensory-Monofilament:          Left foot: diminished          Right foot: diminished       Inspection:          Left foot: normal          Right foot: normal       Nails:          Left foot: thickened          Right foot: thickened    Foot Exam by Podiatrist:       Date: 05/25/2010       Results: early diabetic findings       Done by: Stanford Scotland   Impression & Recommendations:  Problem # 1:  DIABETES MELLITUS, TYPE II (ICD-250.00) Assessment Unchanged Uncontrolled due to the patient's ?noncompliance r/t a financial strain. will refer to SW.  Explianed improtance of a close follow up. CBG's reviewed --needs a better afternoon prandial coverage. However, cannot afford Novolog 70/30. therefore will keep on Lantus and await a resolution of a problem with Medicare part D coverage first. Continue with Glucophage; will add glipizide for now. His updated medication list for this problem includes:    Lantus 100 Unit/ml Soln (Insulin glargine) ..... Inject 15 units before bedtime.    Glucophage 1000 Mg Tabs (Metformin hcl) .Marland Kitchen... Take 1 tablet by mouth two times a day    Anacin 81 Mg Tbec (Aspirin) .Marland Kitchen... Take 1 tablet by mouth once a day    Prinivil 10 Mg Tabs (Lisinopril) .Marland Kitchen... Take 1/2  tablet by mouth once a day    Glipizide 5 Mg Tabs (Glipizide) .Marland Kitchen... Take one tablet before lunch and dinner  Orders: T- Capillary Blood Glucose [id number]) T-Hgb A1C (in-house) [id number]) T-Urine Microalbumin w/creat. ratio [id number]) Social Work Referral (Social ) Ophthalmology Referral (Ophthalmology)  Labs Reviewed: Creat: 1.10 (07/05/2009)     Last Eye Exam: diabetic retinopathy  (10/22/2007) Reviewed HgBA1c results: 8.2 (05/25/2010)  8.5 (02/16/2010)  Problem # 2:  HYPERTENSION, BENIGN ESSENTIAL, CONTROLLED (ICD-401.1) Assessment: Unchanged No change in therapy. will check urine microalbumin today. His updated medication list for this problem includes:    Prinivil 10 Mg Tabs (Lisinopril) .Marland Kitchen... Take 1/2  tablet by mouth once a day  BP today: 136/73 Prior BP:  114/68 (02/16/2010)  Labs Reviewed: K+: 5.0 (07/05/2009) Creat: : 1.10 (07/05/2009)   Chol: 139 (07/05/2009)   HDL: 40 (07/05/2009)   LDL: 77 (07/05/2009)   TG: 109 (07/05/2009)  Complete Medication List: 1)  Lantus 100 Unit/ml Soln (Insulin glargine) .... Inject 15 units before bedtime. 2)  Relion Mini Pen Needles 31g X 6 Mm Misc (Insulin pen needle) .... Use to inject lantus iinsulin once daily 3)  Glucophage 1000 Mg Tabs (Metformin hcl) .... Take 1 tablet by mouth two times a day 4)  Anacin 81 Mg Tbec (Aspirin) .... Take 1 tablet by mouth once a day 5)  Hydrocortisone 1 % Crea (Hydrocortisone) .... Aplica una capa fina [place] a cuatro vezes a dia 6)  Pravachol 40 Mg Tabs (Pravastatin sodium) .... Take 1 tablet by mouth once a day 7)  Viagra 50 Mg Tabs (Sildenafil citrate) .... Take one tablet one hour before intercourse 8)  Prinivil 10 Mg Tabs (Lisinopril) .... Take 1/2  tablet by mouth once a day 9)  Bayer Contour Test Strp (Glucose blood) .... Tocar su azucar de sangre antes de desayuno y cena 10)  Lancets Ultra Thin Misc (Lancets) .... Tocar su azucar de sangre antes de desayuno y cena 11)  Glipizide 5 Mg Tabs (Glipizide) .... Take one tablet before lunch and dinner  Other Orders: T-Hemoccult Card-Multiple (take home) [id number]) Colonoscopy (Colon)  Patient Instructions: 1)  Please, make an appointment with Ms. Tessiatore for the assistance with  Medicarre, part D application. 2)  Please, take all your medications as prescribed. 3)  please, follow up with an eye doctor. 4)   Please, follow up in 4 weeks. Prescriptions: GLIPIZIDE 5 MG TABS (GLIPIZIDE) take one tablet before lunch and dinner  #60 x 12   Entered and Authorized by:   Milana Obey MD   Signed by:   Milana Obey MD on 05/25/2010   Method used:   Electronically to        [facility] (retail)       [address]       [place], [place]  [address]       Ph: [phone]       Fax: [phone]   RxID:   [id number] PRINIVIL 10 MG TABS (LISINOPRIL) Take 1/2  tablet by mouth once a day  #30 x 6   Entered and Authorized by:   Milana Obey MD   Signed by:   Milana Obey MD on 05/25/2010   Method used:   Electronically to        [facility] (retail)       [address]       [place], [place]  [address]       Ph: [phone]       Fax: [phone]   RxID:   [id number] GLUCOPHAGE 1000 MG  TABS (METFORMIN HCL) Take 1 tablet by mouth two times a day  #60 Each x 6   Entered and Authorized by:   Milana Obey MD   Signed by:   Milana Obey MD on 05/25/2010   Method used:   Electronically to        [facility] (retail)       [address]       [place], [place]  [address]  Ph: [phone]       Fax: [phone]   [id number] RELION MINI PEN NEEDLES 31G X 6 MM  MISC (INSULIN PEN NEEDLE) use to inject lantus iinsulin once daily  #100 x 7   Entered and Authorized by:   Milana Obey MD   Signed by:   Milana Obey MD on 05/25/2010   Method used:   Electronically to        [facility] (retail)       [address]       [place], [place]  [address]       Ph: [phone]       Fax: [phone]   RxID:   [id number] LANTUS 100 UNIT/ML SOLN (INSULIN GLARGINE) inject 15 units before bedtime.  #1 vial x 6   Entered and Authorized by:   Milana Obey MD   Signed by:   Milana Obey MD on 05/25/2010   Method used:   Electronically  to        [facility] (retail)       [address]       [place], [place]  [address]       Ph: [phone]       Fax: [phone]   RxID:   [id number]    Orders Added: 1)  T-Hemoccult Card-Multiple (take home) [82270] 2)  T- Capillary Blood Glucose [82948] 3)  T-Hgb A1C (in-house) [id number] 4)  T-Urine Microalbumin w/creat. ratio [[id number]] 5)  Colonoscopy [Colon] 6)  Est. Patient Level III [id number] 7)  Social Work Referral Lavena Bullion ] 8)  Ophthalmology Referral [Ophthalmology]   Process Orders Check Orders Results:     [facility]: Check successful Tests Sent for requisitioning (May 26, 2010 9:36 AM):     05/25/2010: [facility] -- T-Urine Microalbumin w/creat. ratio [[id number]] (signed)     Prevention & Chronic Care Immunizations   Influenza vaccine: Fluvax Non-MCR  (02/16/2010)   Influenza vaccine deferral: Not indicated  (05/25/2010)   Influenza vaccine due: 01/13/2012    Tetanus booster: Not documented   Tetanus booster due: 05/25/2020    Pneumococcal vaccine: Not documented   Pneumococcal vaccine due: 03/19/2017  Colorectal Screening   Hemoccult: Not documented   Hemoccult action/deferral: Ordered  (05/25/2010)    Colonoscopy: Not documented   Colonoscopy action/deferral: Refused  (05/25/2010)  Other Screening   PSA: Not documented   PSA action/deferral: Discussed-decision deferred  (05/25/2010)   PSA due due: 05/26/2011   Smoking status: never  (05/25/2010)  Diabetes Mellitus   HgbA1C: 8.2  (05/25/2010)   HgbA1C action/deferral: Ordered  (05/25/2010)   Hemoglobin A1C due: 08/24/2010    Eye exam: diabetic retinopathy  (10/22/2007)   Diabetic eye exam action/deferral: Ophthalmology referral  (05/25/2010)   Eye exam due: 04/2008    Foot exam: yes  (05/25/2010)   High risk foot: No  (02/16/2010)   Foot care education: Done  (02/16/2010)   Foot exam due:  08/24/2010    Urine microalbumin/creatinine ratio: 6.3  (10/05/2008)   Urine microalbumin action/deferral: Ordered   Urine microalbumin/cr due: 05/26/2011    Diabetes flowsheet reviewed?: Yes   Progress toward A1C goal: Unchanged    Stage of readiness to change (diabetes management): Maintenance  Lipids   Total Cholesterol: 139  (07/05/2009)   Lipid panel action/deferral: Lipid Panel ordered   LDL: 77  (07/05/2009)  LDL Direct: Not documented   HDL: 40  (07/05/2009)   Triglycerides: 109  (07/05/2009)   Lipid panel due: 06/25/2010    SGOT (AST): 11  (07/05/2009)   BMP action: Ordered   SGPT (ALT): 11  (07/05/2009)   Alkaline phosphatase: 71  (07/05/2009)   Total bilirubin: 0.3  (07/05/2009)   Liver panel due: 06/25/2010    Lipid flowsheet reviewed?: Yes   Progress toward LDL goal: Unchanged    Stage of readiness to change (lipid management): Maintenance  Hypertension   Last Blood Pressure: 136 / 73  (05/25/2010)   Serum creatinine: 1.10  (07/05/2009)   BMP action: Ordered   Serum potassium 5.0  ([id number])   Basic metabolic panel due: 123XX123    Hypertension flowsheet reviewed?: Yes   Progress toward BP goal: Unchanged    Stage of readiness to change (hypertension management): Maintenance  Self-Management Support :   Personal Goals (by the next clinic visit) :     Personal A1C goal: 7  (07/05/2009)     Personal blood pressure goal: 130/80  (07/05/2009)     Personal LDL goal: 100  (07/05/2009)    Patient will work on the following items until the next clinic visit to reach self-care goals:     Medications and monitoring: take my medicines every day, bring all of my medications to every visit, examine my feet every day  (05/25/2010)     Eating: drink diet soda or water instead of juice or soda, eat more vegetables, eat foods that are low in salt, eat baked foods instead of fried foods, eat fruit for snacks and desserts, limit or avoid alcohol  (05/25/2010)      Activity: take a 30 minute walk every day  (05/25/2010)    Diabetes self-management support: Resources for patients handout  (05/25/2010)   Last diabetes self-management training by diabetes educator: 11/01/2008    Hypertension self-management support: Resources for patients handout  (05/25/2010)    Lipid self-management support: Resources for patients handout  (05/25/2010)     Self-management comments: [place] handout printed.   Nursing Instructions: Give tetanus booster today Give Pneumovax today Provide Hemoccult cards with instructions (see order) HgbA1C today (see order) Refer for screening diabetic eye exam (see order)    Laboratory Results   Blood Tests   Date/Time Received: May 25, 2010 2:04 PM Date/Time Reported: Maryan Rued  May 25, 2010 2:04 PM   HGBA1C: 8.2%   (Normal Range: Non-Diabetic - 3-6%   Control Diabetic - 6-8%) CBG Random:: 204mg /dL

## 2010-06-15 NOTE — Letter (Signed)
Summary: GLUCOSE TESTING LOG   GLUCOSE TESTING LOG   Imported By: Garlan Fillers 05/31/2010 11:50:44  _____________________________________________________________________  External Attachment:    Type:   Image     Comment:   External Document

## 2010-06-15 NOTE — Progress Notes (Signed)
Summary: med refill/gp  Phone Note Refill Request Message from:  Fax from Pharmacy on May 02, 2010 4:24 PM  Refills Requested: Medication #1:  PRAVACHOL 40 MG TABS Take 1 tablet by mouth once a day   Last Refilled: 04/14/2010  Method Requested: Electronic Initial call taken by: Morrison Old RN,  May 02, 2010 4:24 PM    Prescriptions: PRAVACHOL 40 MG TABS (PRAVASTATIN SODIUM) Take 1 tablet by mouth once a day  #31 x 6   Entered and Authorized by:   Rudie Meyer MD   Signed by:   Rudie Meyer MD on 05/03/2010   Method used:   Electronically to        [phone][facility] (retail)       [address]       [place], [place]  [address]       Ph: [phone]       Fax: [phone]   RxID:   [id number]

## 2010-06-15 NOTE — Letter (Signed)
Summary: Pre Visit Letter Revised  [facility]  [place], [place] [address]   Phone: [phone]  Fax: [phone]        05/25/2010 MRN: [id number] [facility] [address] [place], [place]  [address]             Procedure Date:  06-20-10   Welcome to the Gastroenterology Division at [facility].    You are scheduled to see a nurse for your pre-procedure visit on 06-06-10 at 10:00a.m. on the 3rd floor at [facility], [place] [employer].  We ask that you try to arrive at our office 15 minutes prior to your appointment time to allow for check-in.  Please take a minute to review the attached form.  If you answer "Yes" to one or more of the questions on the first page, we ask that you call the person listed at your earliest opportunity.  If you answer "No" to all of the questions, please complete the rest of the form and bring it to your appointment.    Your nurse visit will consist of discussing your medical and surgical history, your immediate family medical history, and your medications.   If you are unable to list all of your medications on the form, please bring the medication bottles to your appointment and we will list them.  We will need to be aware of both prescribed and over the counter drugs.  We will need to know exact dosage information as well.    Please be prepared to read and sign documents such as consent forms, a financial agreement, and acknowledgement forms.  If necessary, and with your consent, a friend or relative is welcome to sit-in on the nurse visit with you.  Please bring your insurance card so that we may make a copy of it.  If your insurance requires a referral to see a specialist, please bring your referral form from your primary care physician.  No co-pay is required for this nurse visit.     If you cannot keep your appointment, please call [phone] to cancel or reschedule prior to your appointment date.  This  allows [place] the opportunity to schedule an appointment for another patient in need of care.    Thank you for choosing [facility] for your medical needs.  We appreciate the opportunity to care for you.  Please visit [place] at our website  to learn more about our practice.  Sincerely, The Gastroenterology Division

## 2010-06-15 NOTE — Letter (Signed)
Summary: [facility] Instructions  [facility]  [place], [place] [address]   Phone: [phone]  Fax: [phone]       Dustin Burns    01/16/1951    MRN: [id number]       Procedure Day Dustin Burns:  Dustin Burns  06/20/10     Arrival Time: 8:00AM     Procedure Time:  9:00AM     Location of Procedure:                    Dustin Burns _  Dustin Burns (4th Floor)   [place]  Starting 5 days prior to your procedure 06/15/10 do not eat nuts, seeds, popcorn, corn, beans, peas,  salads, or any raw vegetables.  Do not take any fiber supplements (e.g. Metamucil, Citrucel, and Benefiber). ____________________________________________________________________________________________________   THE DAY BEFORE YOUR PROCEDURE         DATE: 06/19/10  DAY: MONDAY  1   Drink clear liquids the entire day-NO SOLID FOOD  2   Do not drink anything colored red or purple.  Avoid juices with pulp.  No orange juice.  3   Drink at least 64 oz. (8 glasses) of fluid/clear liquids during the day to prevent dehydration and help the prep work efficiently.  CLEAR LIQUIDS INCLUDE: Water Jello Ice Popsicles Tea (sugar ok, no milk/cream) Powdered fruit flavored drinks Coffee (sugar ok, no milk/cream) Gatorade Juice: apple, white grape, white cranberry  Lemonade Clear bullion, consomm, broth Carbonated beverages (any kind) Strained chicken noodle soup Hard Candy  4   Mix the entire bottle of Miralax with 64 oz. of Gatorade/Powerade in the morning and put in the refrigerator to chill.  5   At 3:00 pm take 2 Dulcolax/Bisacodyl tablets.  6   At 4:30 pm take one Reglan/Metoclopramide tablet.  7  Starting at 5:00 pm drink one 8 oz glass of the Miralax mixture every 15-20 minutes until you have finished drinking the entire 64 oz.  You should finish drinking prep around 7:30 or 8:00 pm.  8   If you are nauseated, you may take the 2nd Reglan/Metoclopramide tablet  at 6:30 pm.        9    At 8:00 pm take 2 more DULCOLAX/Bisacodyl tablets.     THE DAY OF YOUR PROCEDURE      DATE:  06/20/10  DAY: Dustin Burns  You may drink clear liquids until 7:00AM  (2 HOURS BEFORE PROCEDURE).   MEDICATION INSTRUCTIONS  Unless otherwise instructed, you should take regular prescription medications with a small sip of water as early as possible the morning of your procedure.  Diabetic patients - see separate instructions.    Additional medication instructions: Be sure to take your Lisinopril the morning of procedure.          OTHER INSTRUCTIONS  You will need a responsible adult at least 59 years of age to accompany you and drive you home.   This person must remain in the waiting room during your procedure.  Wear loose fitting clothing that is easily removed.  Leave jewelry and other valuables at home.  However, you may wish to bring a book to read or an iPod/MP3 player to listen to music as you wait for your procedure to start.  Remove all body piercing jewelry and leave at home.  Total time from sign-in until discharge is approximately 2-3 hours.  You should go home directly after your procedure and rest.  You can resume normal activities the day after your procedure.  The day of your procedure you should not:   Drive   Make legal decisions   Operate machinery   Drink alcohol   Return to work  You will receive specific instructions about eating, activities and medications before you leave.   The above instructions have been reviewed and explained to me by   [name]  June 09, 2010 10:24 AM     I fully understand and can verbalize these instructions _____________________________ Date _______   Appended Document: Miralax Instructions All informational,consents and prep instructions were interpreted to pt. by interpreter [name].

## 2010-06-15 NOTE — Miscellaneous (Signed)
Summary: [facility] Previsit/prep  Clinical Lists Changes  Medications: Added new medication of DULCOLAX 5 MG  TBEC (BISACODYL) Day before procedure take 2 at 3pm and 2 at 8pm. - Signed Added new medication of METOCLOPRAMIDE HCL 10 MG  TABS (METOCLOPRAMIDE HCL) As per prep instructions. - Signed Added new medication of MIRALAX   POWD (POLYETHYLENE GLYCOL 3350) As per prep  instructions. - Signed Rx of DULCOLAX 5 MG  TBEC (BISACODYL) Day before procedure take 2 at 3pm and 2 at 8pm.;  #4 x 0;  Signed;  Entered by: [name];  Authorized by: Gatha Mayer MD, [facility];  Method used: Electronically to [facility] [address], [place], [place], [place]  [address], Ph: [phone], Fax: [phone] Rx of METOCLOPRAMIDE HCL 10 MG  TABS (METOCLOPRAMIDE HCL) As per prep instructions.;  #2 x 0;  Signed;  Entered by: [name];  Authorized by: Gatha Mayer MD, [facility];  Method used: Electronically to [facility] [address], [place], [place], [place]  [address], Ph: [phone], Fax: [phone] Rx of MIRALAX   POWD (POLYETHYLENE GLYCOL 3350) As per prep  instructions.;  #255gm x 0;  Signed;  Entered by: [name];  Authorized by: Gatha Mayer MD, [facility];  Method used: Electronically to [facility] [address] [place], [place], [place]  [address], Ph: [phone], Fax: [phone] Observations: Added new observation of NKA: T (06/09/2010 9:11)    Prescriptions: MIRALAX   POWD (POLYETHYLENE GLYCOL 3350) As per prep  instructions.  #255gm x 0   Entered by:   [name]   Authorized by:   Gatha Mayer MD, [facility]   Signed by:   [name] on 06/09/2010   Method used:   Electronically to        [facility] (retail)       [address]       [place], [place]  [address]       Ph: [phone]       Fax: [phone]   RxID:   [id number] METOCLOPRAMIDE HCL 10 MG  TABS (METOCLOPRAMIDE HCL)  As per prep instructions.  #2 x 0   Entered by:   [name]   Authorized by:   Gatha Mayer MD, [facility]   Signed by:   [name] on 06/09/2010   Method used:   Electronically to        [facility] (retail)       [address]       [place], [place]  [address]       Ph: [phone]       Fax: [phone]   RxID:   [id number] DULCOLAX 5 MG  TBEC (BISACODYL) Day before procedure take 2 at 3pm and 2 at 8pm.  #4 x 0   Entered by:   [name]   Authorized by:   Gatha Mayer MD, [facility]   Signed by:   [name] on 06/09/2010   Method used:   Electronically to        [facility] (retail)       [address]       [place], [place]  [address]       Ph: [phone]  Fax: [phone]   [id number]   Appended Document: [place] Previsit/prep    Clinical Lists Changes  Medications: Added new medication of DULCOLAX 5 MG  TBEC (BISACODYL) Day before procedure take 2 at 3pm and 2 at 8pm. - Signed Added new medication of MIRALAX   POWD (POLYETHYLENE GLYCOL 3350) As per prep  instructions. - Signed Added new medication of METOCLOPRAMIDE HCL 10 MG  TABS (METOCLOPRAMIDE HCL) As per prep instructions. - Signed Rx of DULCOLAX 5 MG  TBEC (BISACODYL) Day before procedure take 2 at 3pm and 2 at 8pm.;  #4 x 0;  Signed;  Entered by: [name];  Authorized by: Gatha Mayer MD, [facility];  Method used: Electronically to [facility] [phone]*, [place] [place], [place]  [address], Ph: [phone], Fax: [phone] Rx of MIRALAX   POWD (POLYETHYLENE GLYCOL 3350) As per prep  instructions.;  #255gm x 0;  Signed;  Entered by: [name];  Authorized by: Gatha Mayer MD, [facility];  Method used: Electronically to [facility] [phone]*, [address] [place] [place], [place]  [address], Ph: [phone], Fax: [phone] Rx of METOCLOPRAMIDE HCL 10 MG  TABS (METOCLOPRAMIDE HCL) As per  prep instructions.;  #2 x 0;  Signed;  Entered by: [name];  Authorized by: Gatha Mayer MD, [facility];  Method used: Electronically to [facility] [phone]*, [address] [place], [place]  [address], Ph: [phone], Fax: [phone]    Prescriptions: METOCLOPRAMIDE HCL 10 MG  TABS (METOCLOPRAMIDE HCL) As per prep instructions.  #2 x 0   Entered by:   [name]   Authorized by:   Gatha Mayer MD, [facility]   Signed by:   [name] on 06/09/2010   Method used:   Electronically to        [facility] [phone]* (retail)       [address]       [place], [place]  [address]       Ph: [phone]       Fax: [phone]   RxID:   [id number] MIRALAX   POWD (POLYETHYLENE GLYCOL 3350) As per prep  instructions.  #255gm x 0   Entered by:   [name]   Authorized by:   Gatha Mayer MD, [facility]   Signed by:   [name] on 06/09/2010   Method used:   Electronically to        [facility] [phone]* (retail)       [address]       [place], [place]  [address]       Ph: [phone]       Fax: [phone]   [id number] DULCOLAX 5 MG  TBEC (BISACODYL) Day before procedure take 2 at 3pm and 2 at 8pm.  #4 x 0   Entered by:   [name]   Authorized by:   Gatha Mayer MD, [facility]   Signed by:   [name] on 06/09/2010   Method used:   Electronically to        [facility] [phone]* (retail)       [address]       [place],   [address]       Ph: [phone]       Fax: [phone]   [id number]    Appended Document: [place] Previsit/prep Interpreter requested for Procedure day.

## 2010-06-15 NOTE — Letter (Signed)
Summary: Diabetic Instructions  [facility]  [place], [place] [address]   Phone: [phone]  Fax: [phone]    Dustin Burns November 26, 1950 MRN: [id number]   _ X _   ORAL DIABETIC MEDICATION INSTRUCTIONS  The day before your procedure:   Take your diabetic pill as you do normally  The day of your procedure:   Do not take your diabetic pill    We will check your blood sugar levels during the admission process and again in Recovery before discharging you home  ________________________________________________________________________  _ X _   INSULIN (LONG ACTING) MEDICATION INSTRUCTIONS (Lantus, NPH, 70/30, Humulin, Novolin-N)   The day before your procedure:   Take  your regular evening dose    The day of your procedure:   Do not take your morning dose

## 2010-06-20 ENCOUNTER — Other Ambulatory Visit: Payer: Self-pay | Admitting: Internal Medicine

## 2010-06-20 ENCOUNTER — Other Ambulatory Visit (AMBULATORY_SURGERY_CENTER): Payer: Medicare Other | Admitting: Internal Medicine

## 2010-06-20 DIAGNOSIS — K621 Rectal polyp: Secondary | ICD-10-CM

## 2010-06-20 DIAGNOSIS — K620 Anal polyp: Secondary | ICD-10-CM

## 2010-06-20 DIAGNOSIS — D128 Benign neoplasm of rectum: Secondary | ICD-10-CM

## 2010-06-20 DIAGNOSIS — D129 Benign neoplasm of anus and anal canal: Secondary | ICD-10-CM

## 2010-06-20 DIAGNOSIS — Z1211 Encounter for screening for malignant neoplasm of colon: Secondary | ICD-10-CM

## 2010-06-20 DIAGNOSIS — D126 Benign neoplasm of colon, unspecified: Secondary | ICD-10-CM

## 2010-06-20 HISTORY — PX: COLONOSCOPY W/ POLYPECTOMY: SHX1380

## 2010-06-20 HISTORY — DX: Benign neoplasm of rectum: D12.8

## 2010-06-23 ENCOUNTER — Other Ambulatory Visit: Payer: Self-pay | Admitting: *Deleted

## 2010-06-26 ENCOUNTER — Encounter: Payer: Self-pay | Admitting: Internal Medicine

## 2010-06-26 ENCOUNTER — Ambulatory Visit (INDEPENDENT_AMBULATORY_CARE_PROVIDER_SITE_OTHER): Payer: Medicare Other | Admitting: Ophthalmology

## 2010-06-26 ENCOUNTER — Other Ambulatory Visit: Payer: Self-pay | Admitting: Ophthalmology

## 2010-06-26 ENCOUNTER — Encounter: Payer: Self-pay | Admitting: Ophthalmology

## 2010-06-26 DIAGNOSIS — Z0000 Encounter for general adult medical examination without abnormal findings: Secondary | ICD-10-CM | POA: Insufficient documentation

## 2010-06-26 DIAGNOSIS — E785 Hyperlipidemia, unspecified: Secondary | ICD-10-CM

## 2010-06-26 DIAGNOSIS — I10 Essential (primary) hypertension: Secondary | ICD-10-CM

## 2010-06-26 DIAGNOSIS — E119 Type 2 diabetes mellitus without complications: Secondary | ICD-10-CM

## 2010-06-26 MED ORDER — GLIPIZIDE 5 MG PO TABS: 5.0000 mg | ORAL_TABLET | Freq: Two times a day (BID) | ORAL | Status: DC

## 2010-06-26 MED ORDER — ASPIRIN 81 MG PO TBEC: 81.0000 mg | DELAYED_RELEASE_TABLET | Freq: Every day | ORAL | Status: DC

## 2010-06-26 MED ORDER — INSULIN GLARGINE 100 UNIT/ML ~~LOC~~ SOLN: 15.0000 [IU] | Freq: Every day | SUBCUTANEOUS | Status: DC

## 2010-06-26 MED ORDER — LISINOPRIL 10 MG PO TABS: 5.0000 mg | ORAL_TABLET | Freq: Every day | ORAL | Status: DC

## 2010-06-26 MED ORDER — PRAVASTATIN SODIUM 40 MG PO TABS
40.0000 mg | ORAL_TABLET | Freq: Every day | ORAL | Status: DC
Start: 2010-06-26 — End: 2010-11-24

## 2010-06-26 MED ORDER — METFORMIN HCL 1000 MG PO TABS: 1000.0000 mg | ORAL_TABLET | Freq: Two times a day (BID) | ORAL | Status: DC

## 2010-06-26 NOTE — Assessment & Plan Note (Addendum)
The patient's last lipid profile was performed in February of 2011. His lipids were under good control at that time and I will recheck a fasting lipid panel sometime in the next few weeks.

## 2010-06-26 NOTE — Assessment & Plan Note (Addendum)
The patients blood pressure was within reasonable control today (BP: 136/77 mmHg ) and I will not make any adjustments to the patients anti-hypertensive regimen. I will continue to monitor and titrate the patients medications as needed at future visits.  This control is particularly good given that the patient has not been on his medications.

## 2010-06-26 NOTE — Assessment & Plan Note (Addendum)
The patient has been off all of his medications including his insulin for the last week and his fasting CBGs have been in the low 100s to mid 100s.  Will have the patient restart all of his medications at this time, and followup in the clinic in one month to ensure that the patient is getting better control of his diabetes. It is also important that his progress in applying for Medicaid MQB  is continued to be followed.   The patient's last hemoglobin A1c was performed back in January and was 8.2 which is actually improved from the prior October.   I did give the patient sample of Lantus today which should last him approximately one month. I did inform patient that we would be unable to continue giving him Lantus samples and that this was only a bridge to allow him to be able to afford his medications.     I did  Encourage the patient to continue regularly checking his post prandial CBG's, as well as to continue checking his feet for lesions daily.  I also recommended that the patient continue to watch their diet and avoid high glycemic index foods.  The patient denies any barriers to these recommendations.   Lab Results  Component Value Date   HGBA1C 8.2 05/25/2010   HGBA1C 8.5 02/16/2010   HGBA1C 8.1 11/03/2009   Lab Results  Component Value Date   MICROALBUR 1.19 05/25/2010   CREATININE 1.10 07/05/2009

## 2010-06-26 NOTE — Assessment & Plan Note (Signed)
The patient recently had a colonoscopy  And is scheduled for diabetic retinopathy screening tomorrow. The patient is up-to-date on his flu shot. I will recheck a fasting lipid panel in the next few weeks.

## 2010-06-26 NOTE — Patient Instructions (Signed)
If you have any questions about her medical care, or run out of your prescriptions, please feel free to call the clinic. I would like you to followup with the clinic in one month to ensure that your diabetes is under good control. I'm restarting you diabetes medicines, if you have any problems or develop hypoglycemia , call the clinic so that we can adjust your doses.

## 2010-06-26 NOTE — Progress Notes (Signed)
  Subjective:    Patient ID: Dustin Burns, male    DOB: 1950/09/09, 60 y.o.   MRN: [id number]  HPI  This is a 60 year old Hispanic male with a past medical history significant for  hypertension, diabetes , and dyslipidemia , who presents for medication refill. The patient was last seen by Dr. Stanford Scotland  On January  12th,  , and at that time she was concerned about his uncontrolled diabetes secondary to noncompliance. The patient was referred to social work, and the steps which the patient would require 2 apply for Medicaid MQB were laid out for him.  This would help the patient to receive his medications. The patient continues to work through this process at this time.  The patient states that after his last visit, he never received any of his medications and he's been completely out of all medications for the last week.  With regards to his diabetes, the patient's blood sugars have been under relatively good control with CBGs ranging from approximately 100 up to almost 300, averages have been right around 150. The patient denies any hypoglycemic symptoms. The patient has been checking his feet regularly and has not noted any skin breakdown.  The patient states that he is scheduled for an eye appointment tomorrow.  Review of Systems  Constitutional: Negative for fever and chills.  Respiratory: Negative for cough and shortness of breath.   Cardiovascular: Negative for chest pain and palpitations.  Gastrointestinal: Negative for vomiting, diarrhea and constipation.       Objective:   Physical Exam  Constitutional: He appears well-developed and well-nourished.  HENT:  Head: Normocephalic and atraumatic.  Eyes: Pupils are equal, round, and reactive to light.  Cardiovascular: Normal rate, regular rhythm and intact distal pulses.  Exam reveals no gallop and no friction rub.   No murmur heard. Pulmonary/Chest: Effort normal and breath sounds normal. He has no wheezes. He has no rales.  Abdominal:  Soft. Bowel sounds are normal. He exhibits no distension. There is no tenderness.  Musculoskeletal: Normal range of motion.  Neurological: He is alert. No cranial nerve deficit.  Skin: No rash noted.          Assessment & Plan:

## 2010-07-05 NOTE — Procedures (Deleted)
Summary: Colonoscopy   Colonoscopy  Procedure date:  06/20/2010  Findings:      Location:  [place].   COLONOSCOPY PROCEDURE REPORT  PATIENT:  Dustin Burns, Dustin Burns  MR#:  [id number] BIRTHDATE:   30-Oct-1950, 59 yrs. old   GENDER:   male ENDOSCOPIST:   Gatha Mayer, MD, [facility] REF. BY:         Mechele Dawley, MD PROCEDURE DATE:  06/20/2010 PROCEDURE:  Colonoscopy with snare polypectomy ASA CLASS:   Class II INDICATIONS: Routine Risk Screening  MEDICATIONS:    Fentanyl 50 mcg IV, Versed 5 mg  DESCRIPTION OF PROCEDURE:   After the risks benefits and alternatives of the procedure were thoroughly explained, informed consent was obtained.  Digital rectal exam was performed and revealed no rectal masses, normal prostate and decreased sphincter tone.   The LB CF-H180AL [id number] endoscope was introduced through the anus and advanced to the cecum, which was identified by both the appendix and ileocecal valve, Seeds kept clogging the colonoscope.  The quality of the prep was good, using MiraLax.  The instrument was then slowly withdrawn as the colon was fully examined. <<PROCEDUREIMAGES>>        <<OLD IMAGES>>  FINDINGS:  Two polyps were found. They were diminutive. They were 4 - 5 mm in size. Polyps were snared without cautery. Retrieval was successful for the rectal polyp but not hepatic flexure polyp.  This was otherwise a normal examination of the colon.   Retroflexed views in the rectum revealed no abnormalities.    The scope was then withdrawn from the patient and the procedure completed.  COMPLICATIONS:   None ENDOSCOPIC IMPRESSION:  1)  Two 4 - 5 mm polyps removed, one recovered  2) Otherwise normal examination with good prep    REPEAT EXAM:   In for Colonoscopy, pending biopsy results.    Gatha Mayer, MD, [facility]  CC: The Patient Mechele Dawley, MD [facility])    Appended Document: Colonoscopy     Procedures Next Due  Date:    Colonoscopy: 06/2015

## 2010-07-05 NOTE — Letter (Signed)
Summary: Patient Notice- Polyp Results  [facility]  [address] [place], [place] [address]   Phone: [phone]  Fax: [phone]        June 26, 2010 MRN: [id number]    [facility] [place], [place]  [address]    Dear Mr. Persaud,  The polyps removed from your colon were adenomatous. This means that they were pre-cancerous or that  they had the potential to change into cancer over time.  I recommend that you have a repeat colonoscopy in 5 years to determine if you have developed any new polyps over time and screen for colorectal cancer. If you develop any new rectal bleeding, abdominal pain or significant bowel habit changes, please contact us before then.  Please call us if you are having persistent problems or have questions about your condition that have not been fully answered at this time.  Sincerely,  Gatha Mayer MD, [facility]  This letter has been electronically signed by your physician.  Appended Document: Patient Notice- Polyp Results Letter Mailed

## 2010-07-10 ENCOUNTER — Other Ambulatory Visit: Payer: Medicare Other

## 2010-07-10 DIAGNOSIS — E785 Hyperlipidemia, unspecified: Secondary | ICD-10-CM

## 2010-07-10 LAB — LIPID PANEL
Cholesterol: 147 mg/dL (ref 0–200)
LDL Cholesterol: 80 mg/dL (ref 0–99)
Total CHOL/HDL Ratio: 3.7 Ratio
VLDL: 27 mg/dL (ref 0–40)

## 2010-07-25 ENCOUNTER — Ambulatory Visit (INDEPENDENT_AMBULATORY_CARE_PROVIDER_SITE_OTHER): Payer: Medicare Other | Admitting: Internal Medicine

## 2010-07-25 ENCOUNTER — Encounter: Payer: Self-pay | Admitting: Internal Medicine

## 2010-07-25 DIAGNOSIS — E119 Type 2 diabetes mellitus without complications: Secondary | ICD-10-CM

## 2010-07-25 DIAGNOSIS — I10 Essential (primary) hypertension: Secondary | ICD-10-CM

## 2010-07-25 DIAGNOSIS — E785 Hyperlipidemia, unspecified: Secondary | ICD-10-CM

## 2010-07-25 DIAGNOSIS — L299 Pruritus, unspecified: Secondary | ICD-10-CM

## 2010-07-25 LAB — LIPID PANEL
Cholesterol: 147 mg/dL (ref 0–200)
Triglycerides: 128 mg/dL (ref <150)

## 2010-07-25 LAB — CBC
Hemoglobin: 11.5 g/dL — ABNORMAL LOW (ref 13.0–17.0)
MCH: 28.8 pg (ref 26.0–34.0)
MCHC: 32.5 g/dL (ref 30.0–36.0)
MCV: 88.5 fL (ref 78.0–100.0)
RBC: 4 MIL/uL — ABNORMAL LOW (ref 4.22–5.81)

## 2010-07-25 LAB — COMPREHENSIVE METABOLIC PANEL
ALT: 10 U/L (ref 0–53)
CO2: 23 mEq/L (ref 19–32)
Calcium: 9.6 mg/dL (ref 8.4–10.5)
Chloride: 104 mEq/L (ref 96–112)
Creat: 1.15 mg/dL (ref 0.40–1.50)
Glucose, Bld: 146 mg/dL — ABNORMAL HIGH (ref 70–99)
Sodium: 138 mEq/L (ref 135–145)
Total Bilirubin: 0.3 mg/dL (ref 0.3–1.2)
Total Protein: 6.4 g/dL (ref 6.0–8.3)

## 2010-07-25 NOTE — Assessment & Plan Note (Signed)
Check lipid profile and cmet today.

## 2010-07-25 NOTE — Assessment & Plan Note (Signed)
Controlled. Continue current regimen. 

## 2010-07-25 NOTE — Progress Notes (Signed)
  Subjective:    Patient ID: Dustin Burns, male    DOB: 1950/07/19, 60 y.o.   MRN: [id number]  HPI  60 yr old man with  Past Medical History  Diagnosis Date  . Dyslipidemia   . Type 2 diabetes mellitus   . Meralgia paresthetica   . Diabetic peripheral neuropathy   . Hypertension   . Acute gastritis     hx of  . Lumbar back pain   comes to the clinic for Diabetes management. Patient reports to have increased Lantus to 19 units at bedtime. Reports that blood glucose levels are better controlled. Complains that Lantus is expensive. He is in the process of getting Medicaid to cover medications. He is opposed to starting meal time coverage because of financial burden.  Patient saw Dr. Baird Cancer for Diabetic Eye Exam. He is to return on Oct 03, 2010.   Patient has chronic pruritus on lower extremity that has not to responded to hydrocortisone cream and several other over the counter medications.   Review of Systems  [all other systems reviewed and are negative       Objective:   Physical Exam  Constitutional: He is oriented to person, place, and time. He appears well-developed and well-nourished.  HENT:  Mouth/Throat: Oropharynx is clear and moist.  Eyes: Conjunctivae and EOM are normal. Pupils are equal, round, and reactive to light.  Neck: Normal range of motion. Neck supple.  Cardiovascular: Normal rate, regular rhythm and normal heart sounds.   Pulmonary/Chest: Effort normal and breath sounds normal.  Abdominal: Soft. Bowel sounds are normal.  Musculoskeletal: Normal range of motion.  Neurological: He is alert and oriented to person, place, and time.  Skin: Rash noted.       Scattered Macular rash left lower extremity, no drainage, no erythema, no swelling  Psychiatric: He has a normal mood and affect.          Assessment & Plan:

## 2010-07-25 NOTE — Assessment & Plan Note (Addendum)
Patient has a macular rash left lower extremity that has not responded to treatment. Will refer to Dermatology for further evaluation.

## 2010-07-25 NOTE — Assessment & Plan Note (Signed)
Continue current regimen for now. Check HgA1c in one month. Meter reviewed. Patient has post prandial elevation of blood glucose level. Patient would benefit from short acting insulin for meal coverage but patient is opposed to starting medication due to financial burden. Diabetic eye exam done. Patient saw Dr. Baird Cancer. Will get records.

## 2010-07-25 NOTE — Patient Instructions (Signed)
Make follow up appointment in 1 month. Go to Dermatology referral. Continue taking all medication as directed.

## 2010-08-01 NOTE — Assessment & Plan Note (Signed)
Summary: Soc. Work  60 min.  Social Work.  Met with patient, friend and interpreter.   Time spent sorting out patient's social situation, finances, [facility] appmt.  Patient is disabled (he has low vision and cannot read because of it).  He makes $1,000 from [place] and $100 is taken out of his check for Medicare Part B.  He rents a room from his friend in [place] and does not work.  He has maybe $100 in his checking account.  He is a Scientist, research (physical sciences) resident.   I laid out all the steps in both English and Spanish for he and his friend to go to [place] in [facility] and apply for Medicaid MQB which will likely pick up his Part B Medicare premium and then get him on to a low income subsidy Medicare drug plan.   He was also instructed to bring those same items and visit with Marlana Latus because that will help with his Medicare copays when he goes to [place] on Friday.   Time consuming visit because of interpreting and also low literacy.  SW follow-up.

## 2010-08-10 NOTE — Procedures (Signed)
Summary: Colonoscopy   Colonoscopy  Procedure date:  06/20/2010  Findings:      Location:  [place].   COLONOSCOPY PROCEDURE REPORT  PATIENT:  Dustin Burns, Dustin Burns  MR#:  [id number] BIRTHDATE:   16-Feb-1951, 59 yrs. old   GENDER:   male ENDOSCOPIST:   Gatha Mayer, MD, [facility] REF. BY:         Mechele Dawley, MD PROCEDURE DATE:  06/20/2010 PROCEDURE:  Colonoscopy with snare polypectomy ASA CLASS:   Class II INDICATIONS: Routine Risk Screening  MEDICATIONS:    Fentanyl 50 mcg IV, Versed 5 mg  DESCRIPTION OF PROCEDURE:   After the risks benefits and alternatives of the procedure were thoroughly explained, informed consent was obtained.  Digital rectal exam was performed and revealed no rectal masses, normal prostate and decreased sphincter tone.   The LB CF-H180AL [id number] endoscope was introduced through the anus and advanced to the cecum, which was identified by both the appendix and ileocecal valve, Seeds kept clogging the colonoscope.  The quality of the prep was good, using MiraLax.  The instrument was then slowly withdrawn as the colon was fully examined. <<PROCEDUREIMAGES>>        <<OLD IMAGES>>  FINDINGS:  Two polyps were found. They were diminutive. They were 4 - 5 mm in size. Polyps were snared without cautery. Retrieval was successful for the rectal polyp but not hepatic flexure polyp.  This was otherwise a normal examination of the colon.   Retroflexed views in the rectum revealed no abnormalities.    The scope was then withdrawn from the patient and the procedure completed.  COMPLICATIONS:   None ENDOSCOPIC IMPRESSION:  1)  Two 4 - 5 mm polyps removed, one recovered  2) Otherwise normal examination with good prep    REPEAT EXAM:   In for Colonoscopy, pending biopsy results.    Gatha Mayer, MD, [facility]  CC: The Patient Mechele Dawley, MD [facility])    Appended Document: Colonoscopy     Procedures Next Due  Date:    Colonoscopy: 06/2015

## 2010-08-16 LAB — GLUCOSE, CAPILLARY: Glucose-Capillary: 275 mg/dL — ABNORMAL HIGH (ref 70–99)

## 2010-08-19 LAB — GLUCOSE, CAPILLARY: Glucose-Capillary: 164 mg/dL — ABNORMAL HIGH (ref 70–99)

## 2010-08-22 LAB — GLUCOSE, CAPILLARY: Glucose-Capillary: 119 mg/dL — ABNORMAL HIGH (ref 70–99)

## 2010-08-23 LAB — GLUCOSE, CAPILLARY
Glucose-Capillary: 261 mg/dL — ABNORMAL HIGH (ref 70–99)
Glucose-Capillary: 148 mg/dL — ABNORMAL HIGH (ref 70–99)

## 2010-08-25 ENCOUNTER — Encounter: Payer: Self-pay | Admitting: Internal Medicine

## 2010-08-25 ENCOUNTER — Ambulatory Visit (INDEPENDENT_AMBULATORY_CARE_PROVIDER_SITE_OTHER): Payer: Medicare Other | Admitting: Internal Medicine

## 2010-08-25 VITALS — BP 106/69 | HR 81 | Temp 98.0°F | Ht 60.0 in | Wt 153.0 lb

## 2010-08-25 DIAGNOSIS — I10 Essential (primary) hypertension: Secondary | ICD-10-CM

## 2010-08-25 DIAGNOSIS — E119 Type 2 diabetes mellitus without complications: Secondary | ICD-10-CM

## 2010-08-25 MED ORDER — INSULIN GLARGINE 100 UNIT/ML ~~LOC~~ SOLN: 16.0000 [IU] | Freq: Every day | SUBCUTANEOUS | Status: DC

## 2010-08-25 NOTE — Assessment & Plan Note (Signed)
Well controlled no changes made today

## 2010-08-25 NOTE — Assessment & Plan Note (Addendum)
Patient's A1c is 7.5, given that he is having morning hypoglycemia we'll reduce her Lantus to 16 units from 19 units to prevent episodes of hypoglycemia. patient states that he has recently changed his diet and has been eating more healthy with less sugar. Given lack of interpreter in the room I was unable to obtain more information, I'll try to bring the patient back within the next week or 2 with an interpreter to discuss further what more can be done to control his diabetes, patient may be a candidate for sliding scale insulin.

## 2010-08-25 NOTE — Progress Notes (Signed)
  Subjective:    Patient ID: Dustin Burns, male    DOB: 1951-04-07, 60 y.o.   MRN: [id number]  HPI  Patient is a very pleasant 60 year old male, Spanish speaking, presents to outpatient clinic for routine followup of his diabetes hypertension hyperlipidemia. Patient has no specific complaints today he has been compliant with medications denies any complaints. Patient presents his CBG log shows that he has been having some hypoglycemia in the morning with sugars as low as in the 60s while he is having high nighttime sugars in the 200 to 300s.  Review of Systems  [all other systems reviewed and are negative       Objective:   Physical Exam  [nursing notereviewed. Constitutional: He is oriented to person, place, and time. He appears well-developed and well-nourished.  HENT:  Head: Normocephalic and atraumatic.  Eyes: Pupils are equal, round, and reactive to light.  Neck: Normal range of motion. No JVD present. No thyromegaly present.  Cardiovascular: Normal rate, regular rhythm and normal heart sounds.   Pulmonary/Chest: Effort normal and breath sounds normal. He has no wheezes. He has no rales.  Abdominal: Soft. Bowel sounds are normal. There is no tenderness. There is no rebound.  Musculoskeletal: Normal range of motion. He exhibits no edema.  Neurological: He is alert and oriented to person, place, and time.  Skin: Skin is warm and dry.          Assessment & Plan:

## 2010-11-24 ENCOUNTER — Ambulatory Visit (INDEPENDENT_AMBULATORY_CARE_PROVIDER_SITE_OTHER): Payer: Medicare Other | Admitting: Internal Medicine

## 2010-11-24 ENCOUNTER — Encounter: Payer: Self-pay | Admitting: Internal Medicine

## 2010-11-24 VITALS — BP 117/68 | HR 73 | Temp 97.0°F | Ht 66.0 in | Wt 153.5 lb

## 2010-11-24 DIAGNOSIS — I10 Essential (primary) hypertension: Secondary | ICD-10-CM

## 2010-11-24 DIAGNOSIS — E119 Type 2 diabetes mellitus without complications: Secondary | ICD-10-CM

## 2010-11-24 DIAGNOSIS — G459 Transient cerebral ischemic attack, unspecified: Secondary | ICD-10-CM

## 2010-11-24 DIAGNOSIS — E785 Hyperlipidemia, unspecified: Secondary | ICD-10-CM

## 2010-11-24 DIAGNOSIS — Z8673 Personal history of transient ischemic attack (TIA), and cerebral infarction without residual deficits: Secondary | ICD-10-CM | POA: Insufficient documentation

## 2010-11-24 LAB — GLUCOSE, CAPILLARY: Glucose-Capillary: 194 mg/dL — ABNORMAL HIGH (ref 70–99)

## 2010-11-24 MED ORDER — GLIPIZIDE 5 MG PO TABS: 5.0000 mg | ORAL_TABLET | Freq: Two times a day (BID) | ORAL | Status: DC

## 2010-11-24 MED ORDER — PRAVASTATIN SODIUM 40 MG PO TABS: 40.0000 mg | ORAL_TABLET | Freq: Every day | ORAL | Status: DC

## 2010-11-24 MED ORDER — GLUCOSE BLOOD VI STRP: ORAL_STRIP | Status: DC

## 2010-11-24 MED ORDER — METFORMIN HCL 1000 MG PO TABS: 1000.0000 mg | ORAL_TABLET | Freq: Two times a day (BID) | ORAL | Status: DC

## 2010-11-24 MED ORDER — INSULIN NPH ISOPHANE & REGULAR (70-30) 100 UNIT/ML ~~LOC~~ SUSP: SUBCUTANEOUS | Status: DC

## 2010-11-24 MED ORDER — "INSULIN SYRINGE-NEEDLE U-100 31G X 5/16"" 0.5 ML MISC": Status: DC

## 2010-11-24 MED ORDER — LISINOPRIL 10 MG PO TABS: 5.0000 mg | ORAL_TABLET | Freq: Every day | ORAL | Status: DC

## 2010-11-24 NOTE — Assessment & Plan Note (Addendum)
Poorly controlled. Hemoglobin A1c today was 8.3 which trended up from 7.5 in April 2012. Patient reported not taking his Lantus in the past one week because he cannot afford his insulin. He does have Medicare [employer] however he still has to pay $164 per vial. He wishes to change to [facility] insulin. I reviewed his glucose meter with the dates from 08/03/2009- 11/03/2009 range in the 100-150's which I do not think it is accurate since the date is wrong. However, on his hand written CBG log, blood sugars ranged from high 100s to 300s. He does report occasionally levels in the 70s but those values were associated with him not eating.   He states that he was recently seen by an ophthalmologist and reports that he cannot see from his left eye.  Foot exam was within normal limits, no ulcers noted and pulses were symmetric bilaterally.   -Stop Lantus secondary to cost of medication -Start taking Novolin 70/30, inject 20 units 30 minutes before breakfast, and 15 units 30 minutes before dinner (this is based on his weight, 70 kg x0.5 units per kilogram equal 35 units).  -Continue to check blood sugar twice daily -Bring glucose meter to next office visit in one week -Continue Metformin, Glipizide -We'll check a BMP to make sure that his creatinine is within normal limits -Will check urine microalbumin/creatinine ratio -Continue Statin and Aspirin  I spent over 1.5 hours explaining to patient about his disease condition and instructions for his insulin. Case discussed with Dr. Hilma Favors Interpreter was present throughout office visit

## 2010-11-24 NOTE — Assessment & Plan Note (Addendum)
Based on patient's clinical presentation that his symptoms last only 10 minutes, this could be TIA versus hypoglycemic episode versus peripheral neuropathy.  I explained to patient and his wife in detailed about sinus symptoms of stroke and that he needs to go to the emergency room when he has those symptoms. Neurological exam is completely normal, no focal neurological deficit -Continue aspirin 81 mg by mouth daily, and Pravachol 40 mg by mouth each bedtime -He may need further stroke workup in the future if his symptoms are recurrent -I instructed him not to take his wife's medication because it is not safe unless the medication is prescribed for him and that he needs to be evaluated

## 2010-11-24 NOTE — Patient Instructions (Addendum)
Will get labs today Please stop taking Lantus Start taking Novolin 70/30, inject 20 units 30 minutes before breakfast, and 15 units 30 minutes before dinner.  Continue to check your sugar twice daily x 4 days, but check your sugar 4 times daily x 3 days. And bring your glucose meter with you to your next office visit You will need to buy a RELION glucose meter from [facility] and the test strips.   Continue taking Aspirin Next time if you feel numbness or tingling or weakness on one side of your body, you should go to the Emergency Room for further evaluation. I will see you back on 12/01/10 for follow up

## 2010-11-24 NOTE — Progress Notes (Signed)
HPI: [name] is a 60 year old man with past medical history of diabetes mellitus presents to the office today to discuss about insulin. Patient is a non-English speaking and had an interpreter with him. Patient reports that he has not been taking his Lantus in the past week because he cannot afford his insulin. He would like to be switched to [facility] insulin.  He states that prior to last week, he has been taking 19 units of Lantus instead of 16 unit. He reported occasionally blood sugar in the 70s however he associated that with him not eating. Normally his sugar runs in the upper 200 to 300s.  Of note, he states that he has occasional numbness and tingling as well as weakness on the right side of his body that lasted for 10 minutes and then resolve. Patient's wife states that he takes her nitroglycerin whenever he has the numbness and tingling. He denies any show in the past and is compliant with his aspirin 81 mg daily. He denies any nausea vomiting, polyuria, polydipsia, abdominal pain, fever or chills or any other systemic symptoms.  ROS:as per HPI  PE: General: alert, well-developed, and cooperative to examination.   Lungs: normal respiratory effort, no accessory muscle use, normal breath sounds, no crackles, and no wheezes. Heart: normal rate, regular rhythm, no murmur, no gallop, and no rub.  Abdomen: soft, non-tender, normal bowel sounds, no distention, no guarding, no rebound tenderness, no hepatomegaly, and no splenomegaly.  Msk: no joint swelling, no joint warmth, and no redness over joints.  Pulses: 2+ DP/PT pulses bilaterally Extremities: No cyanosis, clubbing, edema, no ulcer noted Neurologic: alert & oriented X3, cranial nerves II-XII intact, strength normal in all extremities, sensation intact to light touch, and gait normal.

## 2010-11-25 LAB — BASIC METABOLIC PANEL WITH GFR
CO2: 24 mEq/L (ref 19–32)
Calcium: 9.8 mg/dL (ref 8.4–10.5)
GFR, Est African American: >60 mL/min (ref >60)
GFR, Est Non African American: 57 mL/min — ABNORMAL LOW (ref >60)
Sodium: 139 mEq/L (ref 135–145)

## 2010-11-25 LAB — MICROALBUMIN / CREATININE URINE RATIO
Creatinine, Urine: 211.3 mg/dL
Microalb, Ur: 7.52 mg/dL — ABNORMAL HIGH (ref 0.00–1.89)

## 2010-12-01 ENCOUNTER — Ambulatory Visit (INDEPENDENT_AMBULATORY_CARE_PROVIDER_SITE_OTHER): Payer: Medicare Other | Admitting: Internal Medicine

## 2010-12-01 ENCOUNTER — Encounter: Payer: Self-pay | Admitting: Dietician

## 2010-12-01 ENCOUNTER — Encounter: Payer: Self-pay | Admitting: Internal Medicine

## 2010-12-01 VITALS — BP 147/82 | HR 75 | Temp 96.6°F | Ht 66.0 in | Wt 159.0 lb

## 2010-12-01 DIAGNOSIS — E119 Type 2 diabetes mellitus without complications: Secondary | ICD-10-CM

## 2010-12-01 DIAGNOSIS — E785 Hyperlipidemia, unspecified: Secondary | ICD-10-CM

## 2010-12-01 DIAGNOSIS — I10 Essential (primary) hypertension: Secondary | ICD-10-CM

## 2010-12-01 DIAGNOSIS — G459 Transient cerebral ischemic attack, unspecified: Secondary | ICD-10-CM

## 2010-12-01 LAB — GLUCOSE, CAPILLARY: Glucose-Capillary: 130 mg/dL — ABNORMAL HIGH (ref 70–99)

## 2010-12-01 MED ORDER — CLOPIDOGREL BISULFATE 75 MG PO TABS: 75.0000 mg | ORAL_TABLET | Freq: Every day | ORAL | Status: DC

## 2010-12-01 MED ORDER — ASPIRIN 81 MG PO TBEC
81.0000 mg | DELAYED_RELEASE_TABLET | Freq: Every day | ORAL | Status: DC
Start: 2010-12-01 — End: 2010-12-20

## 2010-12-01 MED ORDER — INSULIN NPH ISOPHANE & REGULAR (70-30) 100 UNIT/ML ~~LOC~~ SUSP: SUBCUTANEOUS | Status: DC

## 2010-12-01 NOTE — Assessment & Plan Note (Signed)
Followed by [facility] (Dr. Baird Cancer patient believes).  He last saw them in May and has a follow-up scheduled.  Will work to get note from his last visit there.

## 2010-12-01 NOTE — Progress Notes (Signed)
Subjective:    Patient ID: Dustin Burns, male    DOB: 1950/07/22, 60 y.o.   MRN: [id number]  HPI Dustin Burns is a 60 year old gentleman with a history of type 2 DM, HTN, HLD, and possible TIA(s) who presents for follow-up after changing his insulin regimen last week.  He switched to Humulin 70/30 20 units in morning before breakfast and 15 units in the evening before dinner.  Previously he was taking Lantus 16-19 units per day.  He reports that his sugars are doing well.  His morning sugars before breakfast (and before insulin) are have been 67-132.  His evening sugars before dinner (and before evening insulin dose) have been 171-375.  Yesterday his sugars were 132 in the morning, 234 at lunch, 150 at 5pm, and 171 before dinner at 7pm.  He does notice occasional lightheadedness about twice per week during which he notes his glucose is low (lowest glucose recorded this week was 67).  He has had no nausea, vomiting, chills, fever, or polydipsia.  His HbA1c last week was 8.3% and previously it has been 7.5% in April.    He also reports two events of right sides weakness that he has previously described.  The first was months ago and the second was two weeks ago.  Today he describes the recent event as isolated right sided weakness in arm and leg, arm > leg, without any associated numbness and tingling.  He previously reported numbness and tingling along with the weakness.  The events both lasted about 10 minutes.  The recent event occurred while he was sitting down and improved when he got up to walk around.  No voice changes or other associated symptoms.  He could walk and did not drop anything from his hand.  The first event was right sided weakness accompanied by a sensation on the right side of his head, but he cannot describe it in as good of detail as the more recent event.    No smoking, drinking or drugs, but was a past smoker.  Patient speaks Spanish and an interpreter was present for today's  visit.   Review of Systems Review of Systems - History obtained from the patient General ROS: negative for - chills, fatigue, fever or night sweats Cardiovascular ROS: negative for - chest pain, dyspnea on exertion, irregular heartbeat or palpitations Gastrointestinal ROS: no abdominal pain, change in bowel habits, or black or bloody stools Genito-Urinary ROS: no dysuria, trouble voiding, or hematuria Neurological ROS: see HPI     Objective:   Physical Exam  Filed Vitals:   12/01/10 0943  BP: 147/82  Pulse: 75  Temp: 96.6 F (35.9 C)    BP recheck 140/80   General: alert, well-developed, and cooperative to examination.  Head: normocephalic and atraumatic.  Eyes: vision grossly intact, pupils equal, pupils round, pupils reactive to light, no injection and anicteric.  Mouth: pharynx pink and moist, no erythema, and no exudates.  Neck: supple, full ROM, no JVD, and no carotid bruits.  Lungs: normal respiratory effort, no accessory muscle use, normal breath sounds, no crackles, and no wheezes. Heart: normal rate, regular rhythm, no murmur, no gallop, and no rub.  Msk: no joint swelling, no joint warmth, and no redness over joints.  Pulses: 2+ DP/PT pulses bilaterally Extremities: No cyanosis, clubbing, edema  Neurologic: alert & oriented X3, cranial nerves II-XII intact, strength normal in all extremities, sensation intact to light touch in feet and hands.  Gait normal.  Negative pronator drift.  Patellar reflexes 2+ bilaterally.    Skin: turgor normal and no rashes.  Psych: Oriented X3, memory intact for recent and remote, normally interactive, good eye contact, not anxious appearing, and not depressed appearing.     Assessment & Plan:

## 2010-12-01 NOTE — Assessment & Plan Note (Addendum)
These two episodes may be a TIA.  With his history today of weakness being the major symptoms rather than numbness tingling, this is more concerning for a TIA instead of a peripheral neuropathy.  No signs of symptoms of irregular heartbeat.    Plan: (outpatient workup) MRI Brain Carotid dopplers Start Plavix 75mg  daily.  If he can afford this, instructed to stop aspirin 81mg  daily.  If he cannot afford Plavix, instructed to continue aspirin.   Patient advised to go to the emergency room if he has another similar episode of weakness or any episode with signs of a TIA or stroke (weakness, speech changes, numbness and tingling).

## 2010-12-01 NOTE — Assessment & Plan Note (Signed)
Pressure higher today, but has been good until now.  Patient gives history of problem with low pressures 1-2 years ago.  Will not make any changes on this time, but will see how BP is at future visits.

## 2010-12-01 NOTE — Assessment & Plan Note (Signed)
HbA1c has increased last week.  It appears that he is doing a bit better on the Humulin 70/30 BID.  His lunch and dinner sugars are still a bit high, but his morning sugars look good.    Plan: Increase morning insulin dose to 24 units 30 min before breakfast, continue 15 units before dinner Get eye exam result from [facility] Dr. Baird Cancer (done May 22nd per patient) Continue to check sugars 4x per day.  Will return in another 2 weeks to see how the new regimen is doing.

## 2010-12-01 NOTE — Assessment & Plan Note (Addendum)
Last lipid panel showed good control in March.  LFTs done at the time were normal.  Continue pravastatin.

## 2010-12-01 NOTE — Patient Instructions (Signed)
Please return to clinic in 2 weeks  Continue to check your sugars four times per day.   We are scheduling you for carotids ultrasounds and an MRI of your head.

## 2010-12-01 NOTE — Progress Notes (Deleted)
  Subjective:    Patient ID: Dustin Burns, male    DOB: June 01, 1950, 60 y.o.   MRN: [id number]  HPI    Review of Systems     Objective:   Physical Exam        Assessment & Plan:

## 2010-12-01 NOTE — Progress Notes (Signed)
I saw patient and discussed his care with resident Dr. Orvilla Fus.  I agree with the clinical findings and plans as outlined in his note.

## 2010-12-05 NOTE — Progress Notes (Signed)
Addended by: Darnelle Going on: 12/05/2010 09:20 AM   Modules accepted: Orders

## 2010-12-20 ENCOUNTER — Ambulatory Visit (INDEPENDENT_AMBULATORY_CARE_PROVIDER_SITE_OTHER): Payer: Medicare Other | Admitting: Internal Medicine

## 2010-12-20 ENCOUNTER — Encounter: Payer: Self-pay | Admitting: Internal Medicine

## 2010-12-20 VITALS — BP 125/73 | HR 76 | Temp 97.0°F | Ht 66.0 in | Wt 161.7 lb

## 2010-12-20 DIAGNOSIS — Z23 Encounter for immunization: Secondary | ICD-10-CM

## 2010-12-20 DIAGNOSIS — G459 Transient cerebral ischemic attack, unspecified: Secondary | ICD-10-CM

## 2010-12-20 DIAGNOSIS — I10 Essential (primary) hypertension: Secondary | ICD-10-CM

## 2010-12-20 DIAGNOSIS — E119 Type 2 diabetes mellitus without complications: Secondary | ICD-10-CM

## 2010-12-20 MED ORDER — CLOPIDOGREL BISULFATE 75 MG PO TABS: 75.0000 mg | ORAL_TABLET | Freq: Every day | ORAL | Status: DC

## 2010-12-20 NOTE — Assessment & Plan Note (Addendum)
Lab Results  Component Value Date   HGBA1C 8.3 11/24/2010   HGBA1C 7.5 08/25/2010   HGBA1C 8.2 05/25/2010   Lab Results  Component Value Date   MICROALBUR 7.52* 11/24/2010   LDLCALC 76 07/25/2010   CREATININE 1.29 11/24/2010    Assessment:  Glucometer results reviewed today. Diabetes control:   not controlled Progress toward goals:  deteriorated, due to more frequent hypoglycemic episodes and higher blood sugars later in the day. Barriers to meeting goals:  no barriers identified  Plan: Diabetes treatment:    - DC Glipizide as likely providing minimal help regarding BG control given that he is already on insulin therapy.  - Glipizide might be contributing towards hypoglycemia. - reminded to bring blood glucose meter & log to each visit - Continue current insulin regimen, will have close follow-up will likely require escalation of his insulin therapy, perhaps escalate AM insulin to help with hyperglycemia later in the day. - Continue Metformin.

## 2010-12-20 NOTE — Progress Notes (Signed)
Subjective:    Patient ID: Dustin Burns, male    DOB: 1950/06/09, 60 y.o.   MRN: [id number]  HPI Pt is a 60 y.o. male who  has a past medical history of Dyslipidemia; Type 2 diabetes mellitus; Meralgia paresthetica; Diabetic peripheral neuropathy; Hypertension; Acute gastritis; and Lumbar back pain. and presents to clinic today for the following:   1) DM - Patient checking blood sugars 3-4 times daily, before and after breakfast, before lunch, before dinner. Reports fasting blood sugars of 67-171 mg/dL. Currently taking glipizide (Glucotrol), Metformin, and 70/30 insulin 24 units in AM and 15 units in PM. Multiple mild hypoglycemic episodes since last visit. denies polyuria, polydipsia, nausea, vomiting, diarrhea.  does not request refills today.  2) HTN - Patient does not check blood pressure regularly at home. Currently taking lisinopril (Prinivil). denies headaches, dizziness, lightheadedness, chest pain, shortness of breath.  does not request refills today.  3) History of TIAs - pt described history concerning for recurrent TIAs over the past several months, states that he was on aspirin, which he took religiously at that time. Last visit, the patient was prescribed Plavix given his history. However, the patient notes that he has been unable to fill the medication secondary to the cost, he was quoted $60 for a one-month supply out-of-pocket. He is unsure if his Medicare will cover this medication. He has not had any recurrent neurologic deficits including muscle weakness, gait instability, vision changes, paresthesias.    Review of Systems Per HPI.  Current Outpatient Medications Medication Sig  . aspirin (ANACIN) 81 MG EC tablet Take 1 tablet (81 mg total) by mouth daily. OF NOTE: PT take this 2 times daily.  Marland Kitchen glipiZIDE (GLUCOTROL) 5 MG tablet Take 1 tablet (5 mg total) by mouth 2 (two) times daily before a meal. Before lunch and dinner  . glucose blood (BAYER CONTOUR TEST) test strip  Tocar su azucar de sangre antes de desayuno y [place]   . glucose blood test strip Test your sugar twice daily  . hydrocortisone 1 % cream Aplica una capa fina sobre area [place] a cuatro vezes a dia   . insulin NPH-insulin regular (NOVOLIN 70/30) (70-30) 100 UNIT/ML injection Please inject 24 units into your skin 30 minutes before breakfast, and 15 units into skin 30 minutes before dinner.  . Insulin Pen Needle (RELION MINI PEN NEEDLES) 31G X 6 MM MISC Use to inject Lantus insulin once daily   . Insulin Syringe-Needle U-100 (RELION INSULIN SYR 0.5ML/31G) 31G X 5/16" 0.5 ML MISC Use needle to inject into skin  2 times daily  . lisinopril (PRINIVIL,ZESTRIL) 10 MG tablet Take 0.5 tablets (5 mg total) by mouth daily.  . metFORMIN (GLUCOPHAGE) 1000 MG tablet Take 1 tablet (1,000 mg total) by mouth 2 (two) times daily.  . pravastatin (PRAVACHOL) 40 MG tablet Take 1 tablet (40 mg total) by mouth daily.  . sildenafil (VIAGRA) 50 MG tablet Take 50 mg by mouth. One hour before intercourse     Allergies Review of patient's allergies indicates no known allergies.  Past Medical History  Diagnosis Date  . Dyslipidemia   . Type 2 diabetes mellitus   . Meralgia paresthetica   . Diabetic peripheral neuropathy   . Hypertension   . Acute gastritis     hx of  . Lumbar back pain     Objective:   Physical Exam   Filed Vitals:   12/20/10 1020  BP: 125/73  Pulse: 76  Temp: 97 F (  36.1 C)     General: Vital signs reviewed and noted. Well-developed, well-nourished, in no acute distress; alert, appropriate and cooperative throughout examination.  Head: Normocephalic, atraumatic.  Lungs:  Normal respiratory effort. Clear to auscultation BL without crackles or wheezes.  Heart: RRR. S1 and S2 normal without gallop, murmur, or rubs.  Abdomen:  BS normoactive. Soft, Nondistended, non-tender.  No masses or organomegaly.  Extremities: No pretibial edema.  Neurologic: A&O X3, CN II - XII are grossly  intact. Motor strength is 5/5 in the all 4 extremities.       Assessment & Plan:  Interview and assessment and plan were discussed with the patient with a Spanish interpreter present. Due to need for interpretive services, and multiple communications with his pharmacy, it took greater than 60 minutes to coordinate the care for this patient today, which > 50% was in face-to-face interaction with educating the pt of his disease process.   Case and plan of care discussed with Dr. Oval Linsey.

## 2010-12-20 NOTE — Assessment & Plan Note (Addendum)
The patient was religiously taking aspirin therapy at onset of symptoms. Therefore, he is considered an aspirin failure. [place], who confirmed approximately $60 per month for the generic Clopidogrel. However, this is the out-of-pocket expense without insurance. We called [facility] and had them run it through his insurance, which took > 1 hour, and it seems that with his insurance, the Clopidogrel will cost < $3/ month!!! - STOP aspirin - Start Plavix - Continue to work on risk factor modification.

## 2010-12-20 NOTE — Patient Instructions (Addendum)
   Please follow-up at the clinic in 3-4 weeks,  at which time we will reevaluate your diabetes.  STOP GLIPIZIDE.  STOP Aspirin  START PLAVIX  Continue your current insulin treatment just like you are doing.  Check blood sugars 3-4 times daily, before meals and at bedtime.  If you are consistently having blood sugars < 70 mg/dL, call the clinic and come in sooner.  If you are diabetic, please bring your meter to your next visit.  If symptoms worsen, or new symptoms arise, please call the clinic or go to the ER.  Please bring all of your medications in a bag to your next visit.     Regrese a Air traffic controller en 3 o 4 semanas para evaluar su diabetes.  NO TOME GLIPIZIDE a partir de hoy    (suspendalo)  NO TOME ASPIRIN a partir de hoy    (suspendalo)  Comience hoy mismo PLAVIX   Continue usando la insulina de la misma manera que lo ha estado haciendo hasta hoy.  Chece su azucar ( 3 o 4 veces al dia)  antes de cada alimento y a la hora de [place].  Si tiene frecuentemente azucares bajas menos de 70 mg/dl llame a la clinica y venga a consulta.  Sheela Stack su medidor a la Sports administrator.  Si sus molestias o sintomas empeoran o tiene nuevos sintomas llame a la clinica o vaya a la sala de Freight forwarder.

## 2010-12-20 NOTE — Assessment & Plan Note (Signed)
BP Readings from Last 3 Encounters:  12/20/10 125/73  12/01/10 147/82  11/24/10 [id number]    Basic Metabolic Panel:    Component Value Date/Time   NA 139 11/24/2010 1207   K 5.2 11/24/2010 1207   CL 105 11/24/2010 1207   CO2 24 11/24/2010 1207   BUN 27* 11/24/2010 1207   CREATININE 1.29 11/24/2010 1207   CREATININE 1.10 07/05/2009 1836   GLUCOSE 208* 11/24/2010 1207   CALCIUM 9.8 11/24/2010 1207    Assessment: Hypertension control:   controlled  Progress toward goals:   at goal Barriers to meeting goals:  no barriers identified  Plan: Hypertension treatment:   - continue current medications

## 2011-01-12 ENCOUNTER — Encounter: Payer: Medicare Other | Admitting: Internal Medicine

## 2011-01-12 ENCOUNTER — Ambulatory Visit (INDEPENDENT_AMBULATORY_CARE_PROVIDER_SITE_OTHER): Payer: Medicare Other | Admitting: Internal Medicine

## 2011-01-12 ENCOUNTER — Encounter: Payer: Self-pay | Admitting: Internal Medicine

## 2011-01-12 VITALS — BP 141/82 | HR 69 | Temp 97.1°F | Ht 66.0 in | Wt 161.0 lb

## 2011-01-12 DIAGNOSIS — E119 Type 2 diabetes mellitus without complications: Secondary | ICD-10-CM

## 2011-01-12 NOTE — Progress Notes (Signed)
Subjective:    Patient ID: Dustin Burns, male    DOB: 04/13/1951, 60 y.o.   MRN: [id number]  HPI: The patient is a 60 year old male who comes in today for a three-week check for his blood sugar and possible today and symptoms. He speaks only Spanish, so this conversation happened through an interpreter. He was on glipizide, metformin twice daily, 24 units of Novolin in the morning and 15 units of Novolin at night. At last visit 3 weeks ago glipizide was stopped no other changes were made to his regimen. He was experiencing hypoglycemic events prior to that visit. There was some question of TIA symptoms as the patient was having some weakness and shakiness. She was prescribed Plavix at last visit and there was some indication that the pharmacy to ensure that he can get it. He has been taking it since then. He did stop his aspirin after last visit. He states that the insulin regimen that was prescribed was too much for him and he was getting very hypoglycemic and shaky especially around 2 to 3 AM and midway through the morning. He is currently taking 15 units of Novolin in the morning and 12 units of Novolin at night. He states that with this regimen he does not get hypoglycemic. He does not have any recorded hypoglycemic blood sugars in one refill of his blood sugar log. The lowest blood sugar and noted was 110. He still is having blood sugars in the 180 to 230 range in the evening around 6 PM. She is taking all of his other medications as prescribed. He has had no problems with any of his other medications. He states that if he works out a lot at his job early in the morning after taking his insulin he still gets kind of sweaty and shaky at times. He does have to stop work and drink some water. He states that this only happens when he skips meals. He denied any return of the TIA-like symptoms.   Review of Systems  Constitutional: Negative.  Negative for fever, chills, activity change, appetite change,  fatigue and unexpected weight change.  HENT: Negative.   Eyes: Negative.  Negative for visual disturbance.  Respiratory: Negative.  Negative for cough, choking, chest tightness and shortness of breath.   Cardiovascular: Negative.  Negative for chest pain, palpitations and leg swelling.  Gastrointestinal: Negative for nausea, vomiting, diarrhea, constipation, abdominal distention and anal bleeding.  Genitourinary: Negative for dysuria, urgency, frequency and difficulty urinating.  Musculoskeletal: Negative.  Negative for joint swelling and gait problem.  Skin: Negative.  Negative for color change, pallor, rash and wound.  Neurological: Positive for light-headedness. Negative for dizziness, tremors, seizures, syncope, facial asymmetry, speech difficulty, weakness, numbness and headaches.       Patient does occasionally get lightheadedness when he does take his insulin and does not eat.  Hematological: Negative.   Psychiatric/Behavioral: Negative.     Vitals: BP: 141/82 Pulse: 69 Temperature: 97.101F Height 5 feet 6 inches Weight 161 pounds    Objective:   Physical Exam  Constitutional: He is oriented to person, place, and time. He appears well-developed and well-nourished.  HENT:  Head: Normocephalic and atraumatic.  Eyes: EOM are normal. Pupils are equal, round, and reactive to light.  Neck: Normal range of motion. Neck supple. No tracheal deviation present. No thyromegaly present.  Cardiovascular: Normal rate and regular rhythm.   Pulmonary/Chest: Effort normal and breath sounds normal. No respiratory distress. He has no wheezes. He has no rales. He  exhibits no tenderness.  Abdominal: Soft. Bowel sounds are normal. He exhibits no distension. There is no tenderness. There is no rebound and no guarding.  Musculoskeletal: Normal range of motion. He exhibits no edema and no tenderness.  Lymphadenopathy:    He has no cervical adenopathy.  Neurological: He is alert and oriented to person,  place, and time. No cranial nerve deficit.  Skin: Skin is warm and dry. No rash noted. No erythema. No pallor.  Psychiatric: He has a normal mood and affect. His behavior is normal. Judgment and thought content normal.          Assessment & Plan:   1. Followup-patient was seen back for followup of blood sugar and TIA symptoms. He is not having any hypoglycemic events. We did change his insulin regimen to 16 units of no one in the morning and 12 units of Novolin at night. I do feel that the patient would be resistant to more drastic changes of his regimen at this time. But I would like to gradually increase his morning dose of insulin to try to control these evening high sugars. He will start taking 16 units of Novolin in the morning 12 units at one at night he is also taking metformin twice a day 1000 mg. I did encourage him to always eat after taking his insulin. He has had no return of his TIA symptoms, I question whether there actually TIA symptoms versus symptoms of his hypoglycemic events. We will continue to follow once his blood sugars are more normalized to see if there's any return of the symptoms. He is currently taking Plavix we'll continue that at this time.  2. Other medical problems not addressed at today's visit: Dyslipidemia, erectile dysfunction, meralgia paresthetica, hypertension, acute gastritis, neck pain, knee pain.(Of note his blood pressure today 141/82, this is fairly well controlled no change to his hypertensive regimen at today's visit)  3. Disposition -- patient will be seen back in mid to late October or so that he can meet his PCP, Dr. Michail Sermon, and get a check of his hemoglobin A1c which is due October 13. At this visit please check to make sure he is not having hypoglycemic events, he was given information about hypoglycemia. Also check to make sure there is no return of any TIA-like symptoms. You may need to increase his morning dosage of Novolin 70/30 if he is still  having evening time hyperglycemia. All instructions were given to the patient in Spanish.

## 2011-01-12 NOTE — Patient Instructions (Signed)
You were seen today for a check on your sugars. We are going to have you use 16 units of novolin 70/30 in the morning and 12 units of novolin 70/30 at dinner time. We will see you back in mid October. If you have problems with low sugars or need refills before then please feel free to call our office. Our number is [phone]. If you are feeling sick and need to be seen sooner please also feel free to call us. Please remember to bring your blood sugar log when you come back at next visit and bring your medicines with you. Continue taking plavix. Do not take aspirin.  Usted fue visto hoy por un control de sus niveles de Location manager. Vamos a Pensions consultant 16 unidades de Novolin 70/30 en las unidades de la maana y de 12 de Novolin 70/30 en la cena. Nos vemos de nuevo a mediados de octubre. Si usted tiene problemas con niveles bajos de azcar o rellenos necesidad antes, por favor no dude en llamar a nuestra oficina. Nuestro nmero de telfono es [phone]. Si usted se siente enfermo y necesita verlo pronto por favor, no dude en llamarnos. Por favor, recuerde traer su registro de azcar en la sangre cuando vuelvas a la siguiente visita y traer sus medicamentos con usted. Siga tomando Plavix. No tome aspirina.    Hipoglucemia (bajo nivel de glucosa en sangre) (Hypoglycemia, Low Blood Sugar) La hipoglucemia ocurre cuando la glucosa (azcar) en su sangre es demasiado baja. La hipoglucemia puede suceder por muchas razones. La hipoglucemia puede ocurrir [employer] con o sin diabetes. La hipoglucemia puede comenzar sbitamente y ser una emergencia mdica.  CAUSAS Hipoglucemia no significa que tiene diabetes. Las causas ms frecuentes son:  Retardar o IT consultant comidas o no comer la suficiente cantidad de carbohidratos.   Sobredosis de medicamentos. Esto podra ser por accidente o deliberadamente. Si por accidente, necesita cambiar o ajustar su medicacin.   Aumento de ejercicios o actividad sin ajustar  los carbohidratos o medicamentos.   Un trastorno nervioso que afecta algunas funciones corporales como ritmo cardaco, presin sangunea y digestin (neuropata [place]).   Una enfermedad en la que los msculos del estmago no funcionan adecuadamente (gastroparesis). Por lo tanto, los medicamentos podran no absorberse de [place].   Incapacidad para reconocer las seales de hipoglucemia (inconsciencia hipoglucmica).   Absorcin de insulina - puede estar alterada.   El consumo de alcohol.   Embarazo/ciclos menstruales/postparto. Puede deberse al consumo de hormonas.   Ciertos tipos de tumores. Esto es [employer].  SNTOMAS  Sudoracin.   Hambre.   Mareos.   Visin borrosa.   Somnolencia.   Debilitamiento.   Dolor de [place].   Ritmo cardaco rpido.   Temblores.   Nervios.  DIAGNSTICO El diagnstico se realiza mediante el control de la glucosa en sangre de una o varias de las siguientes maneras:  Control de glucosa en sangre con tiras de prueba.   Resultados de laboratorio.  TRATAMIENTO Si cree que su nivel de glucosa en sangre es bajo:  Controle su nivel de glucosa, si es posible. Si tiene menos de 70mg /dl, tome una de las siguientes:   3-4 tabletas de glucosa.    taza de jugo (preferiblemente claro, Elgie Collard).    taza de refresco normal.   1 taza de leche    - 1 tubo de glucosa en gel.   5 - 6 caramelos duros.   No se sobreestimule por que la glucosa en sangre (azcar) se  elevar demasiado.   Espere 15 minutos y vuelva a controlarse la glucosa. Si tiene menos de 70mg /dl, (o por debajo de su rango normal), repita el tratamiento.   Ingiera una colacin si falta ms de una hora hasta su prxima comida.  Tratamiento para la hipoglucemia grave. A veces, la glucosa en sangre podr ser tan baja que ser incapaz de tratarse usted mismo. Podr ser necesario que alguien le ayude. Podra incluso desmayarse o ser incapaz de tragar. Esto  podr requerir una inyeccin de glucagn, que eleva la glucosa en sangre. INSTRUCCIONES PARA EL CUIDADO DOMICILIARIO/PREVENCIN:  Designer, fashion/clothing segn le haya indicado el mdico.   Utilice los medicamentos tal como se le indic.   Siga el plan de alimentacin. No saltee comidas. Coma siempre a la misma hora.   Si va a beber alcohol, hgalo slo con las comidas.   Controle su nivel de glucosa antes de conducir.   Controle su nivel de glucosa antes de hacer ejercicio. Si va a hacer ejercicio por ms tiempo o de [employer] a la usual, controle la glucosa con ms frecuencia.   Siempre lleve el tratamiento con usted. Las tabletas de glucosa son las ms fciles de Catering manager.   Tenga siempre una pulsera de alerta mdica o alguna identificacin que indique que es diabtico. Esto le indicar a las personas que usted tiene diabetes. Si tiene hipoglucemia, tendrn Nature conservation officer idea Customer service manager.  SOLICITE ATENCIN MDICA SI:  Tiene problemas para Advertising account executive de glucosa en el rango indicado.   Tiene episodios frecuentes de hipoglucemia.   Siente efectos adversos por los medicamentos prescriptos.   Tiene sntomas de enfermedad que no mejoran en 3  4 das.   Nota cambios o un nuevo problema en la visin .  SOLICITE ATENCIN MDICA DE INMEDIATO SI:  Usted es un familiar o amigo de la persona cuya glucosa en sangre est por debajo de 70mg /dl y est acompaada de:   Confusin.   Cambio en el estado mental.   Incapacidad para tragar.   Se desmaya.  Document Released: 04/30/2005 Document Re-Released: 07/25/2009 [facility] Patient Information 2011 [place].

## 2011-01-12 NOTE — Progress Notes (Signed)
Mr. Barentine' history and physical examination were reviewed with Dr. Doug Sou and the assessment and plan were formulated together.  I agree with the above documentation.

## 2011-03-27 ENCOUNTER — Ambulatory Visit (INDEPENDENT_AMBULATORY_CARE_PROVIDER_SITE_OTHER): Payer: Medicare Other | Admitting: Internal Medicine

## 2011-03-27 ENCOUNTER — Encounter: Payer: Self-pay | Admitting: Internal Medicine

## 2011-03-27 DIAGNOSIS — M545 Low back pain: Secondary | ICD-10-CM

## 2011-03-27 DIAGNOSIS — I10 Essential (primary) hypertension: Secondary | ICD-10-CM

## 2011-03-27 DIAGNOSIS — Z0000 Encounter for general adult medical examination without abnormal findings: Secondary | ICD-10-CM

## 2011-03-27 DIAGNOSIS — E119 Type 2 diabetes mellitus without complications: Secondary | ICD-10-CM

## 2011-03-27 DIAGNOSIS — Z23 Encounter for immunization: Secondary | ICD-10-CM

## 2011-03-27 LAB — POCT GLYCOSYLATED HEMOGLOBIN (HGB A1C): Hemoglobin A1C: 7.2

## 2011-03-27 LAB — BASIC METABOLIC PANEL
BUN: 21 mg/dL (ref 6–23)
Chloride: 103 mEq/L (ref 96–112)
Potassium: 4.8 mEq/L (ref 3.5–5.3)
Sodium: 138 mEq/L (ref 135–145)

## 2011-03-27 NOTE — Assessment & Plan Note (Signed)
Pt received flu vaccination and Tdap today.

## 2011-03-27 NOTE — Assessment & Plan Note (Signed)
Patient reports continued lower back discomfort. This description is consistent with that of possible sciatic irritation although this was difficult to elucidate from the patient and via interpreter on exam. He has a negative drawer test and he did have increased discomfort on flexion of the hip with pain extending from his left lower back down towards his knee but not below the knee. The plan is to continue anti-inflammatories as needed and warm compresses to relax his back muscles when needed.

## 2011-03-27 NOTE — Assessment & Plan Note (Addendum)
Well controlled with bp 112/73 today.  Last potassium in August 2012 was 5.2 Will continue Lisinopril and check the BMET to assess creatinine and potassium today. If potassium level returns elevated we will get an EKG.  If  EKG is normal we will consider changing the lisinopril.

## 2011-03-27 NOTE — Assessment & Plan Note (Addendum)
Patient appears to have better control with a hemoglobin A1c of 7.2 today is average blood sugar of 160. This is down from 8.3 in July 2012. Reports taking 15Units 70/30 in the morning and 12 units in the evening. Will not change today given patients tendency to have symptoms of hypoglycemia.

## 2011-03-27 NOTE — Progress Notes (Signed)
  Subjective:    Patient ID: Dustin Burns, male    DOB: Jun 02, 1950, 60 y.o.   MRN: [id number]  HPI Patient with PMHx significant for h/o TIA on plavix, hypertension, DM II insulin dependent and chronic back pain presents for a post ER visit followup on 03/09/2011. This ER visit was at [facility] thus we do not have records of the event at this time.  Per the interpreter, the pt reports passing out after feeling like something was pressing down on his head.  The discharge summary from the ER has headache as the diagnosis and apparently a CT of the Head was conducted which did not find any acute events.  Upon further questioning, he in fact did not pass out but felt very drowsy and weak.  He reports that he was sitting down and he had a feeling that something would happen to him but when he stood up he became very drowsy and felt as if he would pass out but never lost consciousness.  He reports that it took him about 30 minutes to become alert.  His glucose at that time 166. He has had no further events.   He has provided his handwritten glucose levels. For the past 30 days his evening blood sugars have been in the high 180's -200's.  Pt report that he often forgets to eat.  He has complaints of prior left leg pain which began approximately 5 weeks ago after hitting his leg on the edge of the couch.  He states that he felt like the nerves in his left leg are stiff and it is slowly getting better. Reports that he is taking Vicodin which has helped but has not needed it for the past 4 days.  He also complains of becoming nauseous  about 2 weeks ago which is improving.  Of note he reports using an oregeno tea to produce vomiting which made him feel better.     Review of Systems As per HPI otherwise negative    Objective:   Physical Exam  Constitutional: He is oriented to person, place, and time. He appears well-developed and well-nourished.       Pleasant Hispanic male with  wife present speaking via interpreter  HENT:  Head: Normocephalic and atraumatic.  Eyes: EOM are normal. Pupils are equal, round, and reactive to light.  Neck: Normal range of motion. Neck supple.  Cardiovascular: Normal rate, regular rhythm, normal heart sounds and intact distal pulses.   Pulmonary/Chest: Effort normal and breath sounds normal.  Musculoskeletal: Normal range of motion. He exhibits no edema and no tenderness.       Left hip: He exhibits normal range of motion and no tenderness.       Left knee: He exhibits normal range of motion, no swelling, no deformity, no erythema, no LCL laxity and normal patellar mobility.       Lumbar back: He exhibits tenderness. He exhibits no swelling, no pain and no spasm.  Neurological: He is alert and oriented to person, place, and time.  Skin: Skin is warm and dry.          Assessment & Plan:

## 2011-03-27 NOTE — Patient Instructions (Addendum)
It was nice to meet you today.  Continue to take 15 Units in the morning and 12 Units in the evening and record your blood sugar levels. Return  to see me in 3-4 months. Continue to take all of her medications as prescribed.  Fue Health and safety inspector. Siga tomando 15 unidades en las Unidades de la maana y 33 por la noche, y anotar sus niveles de Dispensing optician. Volver a verme en 3-4 meses. Siga tomando todos sus medicamentos segn las indicaciones.

## 2011-06-25 DIAGNOSIS — E11349 Type 2 diabetes mellitus with severe nonproliferative diabetic retinopathy without macular edema: Secondary | ICD-10-CM | POA: Diagnosis not present

## 2011-06-25 DIAGNOSIS — H3582 Retinal ischemia: Secondary | ICD-10-CM | POA: Diagnosis not present

## 2011-06-25 DIAGNOSIS — E11311 Type 2 diabetes mellitus with unspecified diabetic retinopathy with macular edema: Secondary | ICD-10-CM | POA: Diagnosis not present

## 2011-06-25 DIAGNOSIS — E1039 Type 1 diabetes mellitus with other diabetic ophthalmic complication: Secondary | ICD-10-CM | POA: Diagnosis not present

## 2011-07-09 ENCOUNTER — Encounter: Payer: Self-pay | Admitting: Internal Medicine

## 2011-07-09 ENCOUNTER — Ambulatory Visit (INDEPENDENT_AMBULATORY_CARE_PROVIDER_SITE_OTHER): Payer: Medicare Other | Admitting: Internal Medicine

## 2011-07-09 ENCOUNTER — Encounter: Payer: Self-pay | Admitting: *Deleted

## 2011-07-09 VITALS — BP 155/85 | HR 78 | Temp 97.1°F | Ht 65.0 in | Wt 159.7 lb

## 2011-07-09 DIAGNOSIS — L270 Generalized skin eruption due to drugs and medicaments taken internally: Secondary | ICD-10-CM

## 2011-07-09 DIAGNOSIS — Z79899 Other long term (current) drug therapy: Secondary | ICD-10-CM | POA: Diagnosis not present

## 2011-07-09 DIAGNOSIS — E785 Hyperlipidemia, unspecified: Secondary | ICD-10-CM

## 2011-07-09 DIAGNOSIS — E119 Type 2 diabetes mellitus without complications: Secondary | ICD-10-CM

## 2011-07-09 DIAGNOSIS — L251 Unspecified contact dermatitis due to drugs in contact with skin: Secondary | ICD-10-CM

## 2011-07-09 DIAGNOSIS — I10 Essential (primary) hypertension: Secondary | ICD-10-CM

## 2011-07-09 DIAGNOSIS — G459 Transient cerebral ischemic attack, unspecified: Secondary | ICD-10-CM

## 2011-07-09 LAB — POCT GLYCOSYLATED HEMOGLOBIN (HGB A1C): Hemoglobin A1C: 6.8

## 2011-07-09 LAB — GLUCOSE, CAPILLARY: Glucose-Capillary: 160 mg/dL — ABNORMAL HIGH (ref 70–99)

## 2011-07-09 MED ORDER — HYDROCORTISONE 1 % EX OINT: TOPICAL_OINTMENT | Freq: Two times a day (BID) | CUTANEOUS | Status: AC

## 2011-07-09 MED ORDER — INSULIN NPH ISOPHANE & REGULAR (70-30) 100 UNIT/ML ~~LOC~~ SUSP: SUBCUTANEOUS | Status: DC

## 2011-07-09 MED ORDER — METFORMIN HCL 1000 MG PO TABS: 1000.0000 mg | ORAL_TABLET | Freq: Two times a day (BID) | ORAL | Status: DC

## 2011-07-09 MED ORDER — LISINOPRIL 10 MG PO TABS: 5.0000 mg | ORAL_TABLET | Freq: Every day | ORAL | Status: DC

## 2011-07-09 MED ORDER — CLOPIDOGREL BISULFATE 75 MG PO TABS: 75.0000 mg | ORAL_TABLET | Freq: Every day | ORAL | Status: DC

## 2011-07-09 MED ORDER — PRAVASTATIN SODIUM 40 MG PO TABS: 40.0000 mg | ORAL_TABLET | Freq: Every day | ORAL | Status: DC

## 2011-07-09 NOTE — Progress Notes (Signed)
Patient ID: Dustin Burns, male   DOB: [birth year], 61 y.o.   MRN: [id number]  61 year old man with well-controlled hypertension, hyperlipidemia and diabetes comes to the clinic complaining of rash See assessment and plan for details.  Physical exam   General Appearance:     Filed Vitals:   07/09/11 1031  BP: 155/85  Pulse: 78  Temp: 97.1 F (36.2 C)  TempSrc: Oral  Height: 5\' 5"  (1.651 m)  Weight: 159 lb 11.2 oz (72.439 kg)  SpO2: 100%     Alert, cooperative, no distress, appears stated age  Head:    Normocephalic, without obvious abnormality, atraumatic  Eyes:    PERRL, conjunctiva/corneas clear, EOM's intact, fundi    benign, both eyes       Neck:   Supple, symmetrical, trachea midline, no adenopathy;       thyroid:  No enlargement/tenderness/nodules; no carotid   bruit or JVD  Lungs:     Clear to auscultation bilaterally, respirations unlabored  Chest wall:    No tenderness or deformity  Heart:    Regular rate and rhythm, S1 and S2 normal, no murmur, rub   or gallop  Abdomen:     Soft, non-tender, bowel sounds active all four quadrants,    no masses, no organomegaly  Extremities:   Extremities normal, atraumatic, no cyanosis or edema  Pulses:   2+ and symmetric all extremities  Skin:  pink small eruptions on the flexor surface of both upper extremities, grooming, axilla   Neurologic:  nonfocal grossly   ROS  Constitutional: Denies fever, chills, diaphoresis, appetite change and fatigue.  Respiratory: Denies SOB, DOE, cough, chest tightness,  and wheezing.   Cardiovascular: Denies chest pain, palpitations and leg swelling.  Gastrointestinal: Denies nausea, vomiting, abdominal pain, diarrhea, constipation, blood in stool and abdominal distention.  Skin: Denies pallor, rash and wound.  Neurological: Denies dizziness, light-headedness, numbness and headaches.

## 2011-07-09 NOTE — Progress Notes (Unsigned)
Pt states he has a rash x 10 days, it is only in skin creased areas, inner elbow area, underarms, inner thighs and neck area, red areas, states it itches. Pt is placed in 1045 open slot, dr Kelton Pillar

## 2011-07-09 NOTE — Progress Notes (Signed)
Interpreter [name] for Dr Kelton Pillar

## 2011-07-09 NOTE — Assessment & Plan Note (Signed)
Patient developed a macular pruritic rash on the flexor surface of both his upper exudate, growing and axilla 3 weeks ago. This started after he change his insulin. He ran out of his current insulin and started taking and started using an old insulin vial that he had from previous prescription He used it for 2 days and saw that the rash was getting worse. He stopped using it and says that the rash did get a little better but then stayed the same. It's very itchy and bothersome throughout the day. He has used Benadryl cream and tablets over the counter without any benefit. He has changed his soap and detergent. This has never happened to him before. No other changes in his medications or food Not allergic to anything that he knows of  Plan -Advice continue Benadryl cream and tablet - Prescribed hydrocortisone 1% cream -Also advise use talcum powder to keep the area dry and clean -Call back in 2 weeks if no improvement- consider dermatology referral or prednisone tablets After examining him

## 2011-07-09 NOTE — Progress Notes (Signed)
Agree. Thanks

## 2011-07-09 NOTE — Patient Instructions (Signed)
Follow up in 2 weeks if not better otherwise follow up with pcp in 1 month  Erupcin cutnea (Rash) Muchas cosas pueden ocasionar una erupcin cutnea. El rash pueden deberse a una infeccin, una reaccin alrgica, medicamentos o qumicos. A veces el contacto con mascotas, algunos tipos de ropa, [place], [place] o alimentos pueden causar una erupcin. CUIDADOS EN EL HOGAR  Aprtese de los mbitos muy clidos o fros. Esto puede hacer que la picazn empeore.   Un bao fresco tambin puede ayudar a Solicitor.   No se rasque.   Tome todos los medicamentos tal como se los prescribi el mdico.   Mantenga limpia la zona afectada.   Si la piel est seca, pase suavemente una crema segn se lo indique el mdico.   Si la piel est muy hmeda, puede secarla segn se lo indique el mdico.  SOLICITE AYUDA DE INMEDIATO SI:  Siente dolor, enrojecimiento o la hinchazn.   Tiene fiebre.   Aparecen vetas rojizas en la piel que se extienden por encima o por debajo del rash.   Siente dolor [employer].   Tiene inflamacin en la garganta.   Presenta dificultades respiratorias o problemas para tragar.   La urticaria no mejora en el trmino de 3 das.  ASEGRESE QUE:   Comprende estas instrucciones.   Controlar su enfermedad.   Solicitar ayuda inmediatamente si no mejora o si empeora.  Document Released: 07/27/2008 Document Revised: 01/10/2011 [facility] Patient Information 2012 [place].

## 2011-07-10 LAB — LIPID PANEL
LDL Cholesterol: 92 mg/dL (ref 0–99)
Total CHOL/HDL Ratio: 4.8 Ratio
Triglycerides: 206 mg/dL — ABNORMAL HIGH (ref <150)

## 2011-07-20 ENCOUNTER — Other Ambulatory Visit: Payer: Self-pay | Admitting: Internal Medicine

## 2011-07-24 ENCOUNTER — Ambulatory Visit (INDEPENDENT_AMBULATORY_CARE_PROVIDER_SITE_OTHER): Payer: Medicare Other | Admitting: Internal Medicine

## 2011-07-24 ENCOUNTER — Encounter: Payer: Self-pay | Admitting: Internal Medicine

## 2011-07-24 DIAGNOSIS — G478 Other sleep disorders: Secondary | ICD-10-CM

## 2011-07-24 DIAGNOSIS — L270 Generalized skin eruption due to drugs and medicaments taken internally: Secondary | ICD-10-CM

## 2011-07-24 DIAGNOSIS — I10 Essential (primary) hypertension: Secondary | ICD-10-CM

## 2011-07-24 DIAGNOSIS — E785 Hyperlipidemia, unspecified: Secondary | ICD-10-CM

## 2011-07-24 DIAGNOSIS — G459 Transient cerebral ischemic attack, unspecified: Secondary | ICD-10-CM | POA: Diagnosis not present

## 2011-07-24 DIAGNOSIS — E119 Type 2 diabetes mellitus without complications: Secondary | ICD-10-CM | POA: Diagnosis not present

## 2011-07-24 DIAGNOSIS — E162 Hypoglycemia, unspecified: Secondary | ICD-10-CM | POA: Insufficient documentation

## 2011-07-24 DIAGNOSIS — G4700 Insomnia, unspecified: Secondary | ICD-10-CM

## 2011-07-24 DIAGNOSIS — M545 Low back pain: Secondary | ICD-10-CM

## 2011-07-24 LAB — GLUCOSE, CAPILLARY
Glucose-Capillary: 60 mg/dL — ABNORMAL LOW (ref 70–99)
Glucose-Capillary: 89 mg/dL (ref 70–99)

## 2011-07-24 MED ORDER — ZOLPIDEM TARTRATE 5 MG PO TABS: 5.0000 mg | ORAL_TABLET | Freq: Every evening | ORAL | Status: DC | PRN

## 2011-07-24 MED ORDER — PRAVASTATIN SODIUM 40 MG PO TABS: 40.0000 mg | ORAL_TABLET | Freq: Every day | ORAL | Status: DC

## 2011-07-24 MED ORDER — IBUPROFEN 600 MG PO TABS: 600.0000 mg | ORAL_TABLET | Freq: Four times a day (QID) | ORAL | Status: AC | PRN

## 2011-07-24 NOTE — Assessment & Plan Note (Signed)
Having a difficult time getting to sleep often still awake until late at night for the past 2-3 months. Denies caffeine at night or alcohol. Patient is interested in medications to help. We'll start low-dose Ambien 5 mg each bedtime in addition to counseling for good sleep hygiene ie no tv, if awaken not to turn on tv, no alcohol or caffeine with several hours of bedtime.

## 2011-07-24 NOTE — Patient Instructions (Addendum)
Para ayudar con su insomnio:   -No utilizar alcohol en la noche  -Apagar el televisor cuando van a la cama  -Si se despierta en medio de la noche, no encienda el televisor.  -Tomar el [employer], de ser necesario Ambien   Para la diabetes:  -Cuando usted se siente nerviosa, tomar el nivel de Dispensing optician y a la derecha el nmero abajo.  -Llamar a la clnica si se estn obteniendo varios de estos episodios.  -No se salte comidas, desayuno incluido   Para su erupcin cutnea:  -Seguir para utilizar la comezn crema segn sea necesario.   Volver a verme en 3  4 meses.    To help with your insomnia:  -Do not use alcohol in the evening -Turn off the tv when going to bed -If you awaken in the middle of the night, do not turn on the tv. -take the Ambien medication as needed  For your diabetes: -when you feel jittery, take your blood sugar and right the number down. -call the clinic if you are getting several of these episodes. -do not skip meals, including breakfast  For your rash: -continue to use the itch cream as needed.  Return to see me in 3-4 months.

## 2011-07-24 NOTE — Assessment & Plan Note (Signed)
Appears resolved.  May continue hydrocortisone cream prn.

## 2011-07-24 NOTE — Assessment & Plan Note (Signed)
At goal with LDL 92 Feb 2013.  May consider lowering goal to <70 given h/o TIA.

## 2011-07-24 NOTE — Assessment & Plan Note (Signed)
Initially with cbg 60.  Given cracker and cbg 89 on recheck. Reports eating breakfast this morning. Continue current self titrated insulin regimen of 16U in am and 13U in pm. Patient was advised to check his blood sugars when he has feelings of jitteriness and to write down the reading.  If low and this occurs several times to call the clinic for further evaluation.

## 2011-07-24 NOTE — Assessment & Plan Note (Signed)
Patient reports back pain when he's doing heavy lifting in his work.  Advised for proper back care when lifting and prescribed Ibuprofen prn.

## 2011-07-24 NOTE — Progress Notes (Signed)
Subjective:     Patient ID: Dustin Burns, male   DOB: [birth year], 61 y.o.   MRN: [id number]  HPI Patient presents for followup of rash, hypertension, and diabetes. Reports that the rash for which he was seen for a week or so close resolved with hydrocortisone cream. He has been recording his blood glucose levels which upon review are ranging from 140 was 200. There are one or 2 entries in approximately 220. Patient reports that he has self adjusted his insulin from the recommended 24 units in the morning 15 units at night to 16 units and 13 units at night because often feels jittery. She reports this occurs when he does not eat breakfast. Of note he has not tested his blood sugar during these jittery episodes. He is requesting a refill of his progress statin and complains of inability to get to sleep until 11, midnight, sometimes 1:00 in the morning. He denies shortness of breath, chest pain, presyncope, or syncope.  Review of Systems  Constitutional: Negative for fever, chills, fatigue and unexpected weight change.  HENT: Negative for congestion.   Eyes: Negative for visual disturbance.  Respiratory: Negative for cough, shortness of breath and stridor.   Cardiovascular: Negative for chest pain.  Musculoskeletal: Positive for back pain.  Skin: Negative for rash.  Neurological: Negative for dizziness and light-headedness.       "jittery" when does not eat breakfast       Objective:   Physical Exam  Skin:          Areas of resolving rash, barely perceptible on left antecubital and no evidence on left axilla       Assessment:     1. DM 2. HTN 3. Hyperlip 4. Insomnia    Plan:     Continue 70/30 16U am 14Upm Refilled pravastatin Start ambien 5 mg qd

## 2011-08-03 DIAGNOSIS — E11311 Type 2 diabetes mellitus with unspecified diabetic retinopathy with macular edema: Secondary | ICD-10-CM | POA: Diagnosis not present

## 2011-08-03 DIAGNOSIS — E11349 Type 2 diabetes mellitus with severe nonproliferative diabetic retinopathy without macular edema: Secondary | ICD-10-CM | POA: Diagnosis not present

## 2011-08-03 DIAGNOSIS — E1039 Type 1 diabetes mellitus with other diabetic ophthalmic complication: Secondary | ICD-10-CM | POA: Diagnosis not present

## 2011-11-09 ENCOUNTER — Other Ambulatory Visit: Payer: Self-pay | Admitting: *Deleted

## 2011-11-09 DIAGNOSIS — E1039 Type 1 diabetes mellitus with other diabetic ophthalmic complication: Secondary | ICD-10-CM | POA: Diagnosis not present

## 2011-11-09 DIAGNOSIS — E11349 Type 2 diabetes mellitus with severe nonproliferative diabetic retinopathy without macular edema: Secondary | ICD-10-CM | POA: Diagnosis not present

## 2011-11-09 DIAGNOSIS — I10 Essential (primary) hypertension: Secondary | ICD-10-CM

## 2011-11-09 MED ORDER — LISINOPRIL 10 MG PO TABS: 5.0000 mg | ORAL_TABLET | Freq: Every day | ORAL | Status: DC

## 2011-11-09 NOTE — Telephone Encounter (Signed)
Rx called in 

## 2011-12-17 ENCOUNTER — Ambulatory Visit (INDEPENDENT_AMBULATORY_CARE_PROVIDER_SITE_OTHER): Payer: Medicare Other | Admitting: Internal Medicine

## 2011-12-17 ENCOUNTER — Encounter: Payer: Self-pay | Admitting: Internal Medicine

## 2011-12-17 VITALS — BP 118/72 | HR 85 | Temp 96.9°F | Ht 65.0 in | Wt 158.4 lb

## 2011-12-17 DIAGNOSIS — E11319 Type 2 diabetes mellitus with unspecified diabetic retinopathy without macular edema: Secondary | ICD-10-CM

## 2011-12-17 DIAGNOSIS — Z8673 Personal history of transient ischemic attack (TIA), and cerebral infarction without residual deficits: Secondary | ICD-10-CM

## 2011-12-17 DIAGNOSIS — E1139 Type 2 diabetes mellitus with other diabetic ophthalmic complication: Secondary | ICD-10-CM

## 2011-12-17 DIAGNOSIS — L988 Other specified disorders of the skin and subcutaneous tissue: Secondary | ICD-10-CM | POA: Diagnosis not present

## 2011-12-17 DIAGNOSIS — G4700 Insomnia, unspecified: Secondary | ICD-10-CM

## 2011-12-17 DIAGNOSIS — E119 Type 2 diabetes mellitus without complications: Secondary | ICD-10-CM

## 2011-12-17 DIAGNOSIS — H579 Unspecified disorder of eye and adnexa: Secondary | ICD-10-CM

## 2011-12-17 DIAGNOSIS — I10 Essential (primary) hypertension: Secondary | ICD-10-CM | POA: Diagnosis not present

## 2011-12-17 LAB — POCT GLYCOSYLATED HEMOGLOBIN (HGB A1C): Hemoglobin A1C: 6.7

## 2011-12-17 LAB — GLUCOSE, CAPILLARY: Glucose-Capillary: 110 mg/dL — ABNORMAL HIGH (ref 70–99)

## 2011-12-17 MED ORDER — ZOLPIDEM TARTRATE 5 MG PO TABS: 5.0000 mg | ORAL_TABLET | Freq: Every evening | ORAL | Status: DC | PRN

## 2011-12-17 NOTE — Assessment & Plan Note (Signed)
Will reorder Ambien 5 mg qhs which pt reports good response to with previous trial of meds.Gave pt written prescription.  Encourage continue good sleep hygeine as previously discussed.

## 2011-12-17 NOTE — Assessment & Plan Note (Signed)
Area is clean without signs of infection.  Pt wears cowboy boots daily.  Discussed continued proper foot hygiene.  Pt states that he is using an ointment from [place] and the wound is healing.  Encourage pt to return for further evaluation if it starts to look red,ooze, become painful or otherwise look infected and not heal in about a week.

## 2011-12-17 NOTE — Patient Instructions (Addendum)
Fue lindo volver a verlo.  Su presin arterial es bueno hoy.  YO le dar una receta para mano Ambien.  Tome nota de esto para ayudar con el sueo.  Continuar con Chief Executive Officer en los niveles de Dispensing optician.  Si el rea que hay entre los dedos de los pies no continuar para Pharmacologist, por favor llame a la clnica para ser evaluados antes.  Me gustara ver que en 4 meses.  It was nice to see you again. Your blood pressure is good today. I will give you a handwritten prescription for Ambien. Take this to help with sleep. Continue to write down your blood sugar levels. If the area between your toes does not continue to get better, please call the clinic to be evaluated sooner. I would like to see you in 4 months.

## 2011-12-17 NOTE — Assessment & Plan Note (Addendum)
HgbA1c of 6.7 demonstrating good control  With cbg 110 today. Continue current regimen of novolog 17u am 13 u pm and metformin 1000 mg bid. Diabetic foot exam today.  Followed by eye doc with recent eye check July 2013.

## 2011-12-17 NOTE — Assessment & Plan Note (Addendum)
Well controlled today 118/72 pulse 82 bpm although pt reports recent elevation to ~200 sbp. Will continue to monitor on current regimen of lisinopril 5mg  qd.

## 2011-12-17 NOTE — Assessment & Plan Note (Signed)
On daily Plavix.  He has Carotid dopplers and MRI brain scheduled Sept 2013.

## 2011-12-17 NOTE — Progress Notes (Signed)
  Subjective:    Patient ID: Dustin Burns, male    DOB: [birth year], 61 y.o.   MRN: [id number]  HPI  Dustin Burns presents to clinic for f/u of his diabetes, hypertension and insomnia with his wife and a Spanish interpreter.  He is managed with Novolin 70/30 self titrated to 17 units in am and 13 units in the pm.  He self-titrated to this level as he described hypoglycemic symptoms previously.  He was told to record his blood sugar during these episodes but reports that he cannot recall any episodes since last visit. He does present with daily recordings which show majority percentage at target goal with several dinner time readings ~ 200. He also states that he is having trouble sleeping again since he ran out of the Ambien. Today he reports sbp was 200 and 211 over the past week but has returned to normal range now.  Denies headaches, chest pain or shortness of breath.  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  Review of Systems  Constitutional: Negative for appetite change, fatigue and unexpected weight change.  HENT: Negative for congestion.   Eyes: Negative for visual disturbance.       States that he had some laser treatment to his left eye in July 2013 per Dr. Sherlynn Stalls.  Respiratory: Negative for cough, chest tightness and shortness of breath.   Cardiovascular: Negative for chest pain, palpitations and leg swelling.  Musculoskeletal: Positive for arthralgias.       Occasional knee aches controlled with Tylenol  Skin: Positive for wound.       Small sore between left 4th/5th toes  Neurological: Negative for syncope, weakness, light-headedness and headaches.  Psychiatric/Behavioral: Negative for dysphoric mood and decreased concentration.       +insomnia and feeling "jumpy at night"       Objective:   Physical Exam  Constitutional: He is oriented to person, place, and time. He appears well-developed and  well-nourished. No distress.  HENT:  Head: Normocephalic and atraumatic.  Eyes: Conjunctivae and EOM are normal. Pupils are equal, round, and reactive to light.       +eyeglasses  Neck: Normal range of motion. Neck supple.  Cardiovascular: Normal rate, regular rhythm, normal heart sounds and intact distal pulses.   Pulmonary/Chest: Effort normal and breath sounds normal. He has no wheezes.  Abdominal: Soft. Bowel sounds are normal. There is no tenderness.       Obese abdomen  Musculoskeletal: Normal range of motion. He exhibits no edema and no tenderness.       Feet:  Neurological: He is alert and oriented to person, place, and time.  Skin: Skin is warm and dry.       Clean area of maceration between left 4th and 5th toes.  Non-eythematous, no oozing, sensation intact.  Psychiatric: He has a normal mood and affect.          Assessment & Plan:

## 2011-12-17 NOTE — Assessment & Plan Note (Signed)
S/p laser treatment per Dr. Sherlynn Stalls July 2013, f/u Nov 2013

## 2012-02-19 ENCOUNTER — Other Ambulatory Visit: Payer: Self-pay | Admitting: Internal Medicine

## 2012-03-13 ENCOUNTER — Ambulatory Visit (INDEPENDENT_AMBULATORY_CARE_PROVIDER_SITE_OTHER): Payer: Medicare Other | Admitting: *Deleted

## 2012-03-13 DIAGNOSIS — Z23 Encounter for immunization: Secondary | ICD-10-CM

## 2012-03-14 DIAGNOSIS — H3582 Retinal ischemia: Secondary | ICD-10-CM | POA: Diagnosis not present

## 2012-03-14 DIAGNOSIS — E1039 Type 1 diabetes mellitus with other diabetic ophthalmic complication: Secondary | ICD-10-CM | POA: Diagnosis not present

## 2012-03-14 DIAGNOSIS — H43829 Vitreomacular adhesion, unspecified eye: Secondary | ICD-10-CM | POA: Diagnosis not present

## 2012-03-14 DIAGNOSIS — E11311 Type 2 diabetes mellitus with unspecified diabetic retinopathy with macular edema: Secondary | ICD-10-CM | POA: Diagnosis not present

## 2012-04-15 ENCOUNTER — Encounter: Payer: Self-pay | Admitting: Internal Medicine

## 2012-05-02 ENCOUNTER — Encounter: Payer: Self-pay | Admitting: Internal Medicine

## 2012-05-02 ENCOUNTER — Ambulatory Visit (INDEPENDENT_AMBULATORY_CARE_PROVIDER_SITE_OTHER): Payer: Medicare Other | Admitting: Internal Medicine

## 2012-05-02 VITALS — BP 126/79 | HR 80 | Temp 98.0°F | Ht 64.0 in | Wt 159.4 lb

## 2012-05-02 DIAGNOSIS — E119 Type 2 diabetes mellitus without complications: Secondary | ICD-10-CM

## 2012-05-02 DIAGNOSIS — G4700 Insomnia, unspecified: Secondary | ICD-10-CM | POA: Diagnosis not present

## 2012-05-02 DIAGNOSIS — L988 Other specified disorders of the skin and subcutaneous tissue: Secondary | ICD-10-CM | POA: Diagnosis not present

## 2012-05-02 DIAGNOSIS — I10 Essential (primary) hypertension: Secondary | ICD-10-CM

## 2012-05-02 LAB — POCT GLYCOSYLATED HEMOGLOBIN (HGB A1C): Hemoglobin A1C: 7.4

## 2012-05-02 MED ORDER — INSULIN NPH ISOPHANE & REGULAR (70-30) 100 UNIT/ML ~~LOC~~ SUSP: SUBCUTANEOUS | Status: DC

## 2012-05-02 NOTE — Assessment & Plan Note (Signed)
BP Readings from Last 3 Encounters:  05/02/12 126/79  12/17/11 118/72  07/24/11 136/82    Lab Results  Component Value Date   NA 138 03/27/2011   K 4.8 03/27/2011   CREATININE 1.32 03/27/2011    Assessment:  Blood pressure control: controlled  Progress toward BP goal:  at goal  Comments:   Plan:  Medications:  continue current medications  Educational resources provided: handout  Self management tools provided: home blood pressure logbook;other (see comments) (Given information in Spanish)  Other plans: continue lisinopril 10 mg qd

## 2012-05-02 NOTE — Progress Notes (Signed)
  Subjective:    Patient ID: Dustin Burns, male    DOB: Oct 20, 1950, 60 y.o.   MRN: [id number]  HPI Pt presents for f/u of diabetes, hypertension, and insomnia. Patient is without complaints and reports that his insomnia has improved thus he does not need to take Ambien regularly. States he has been doing well although he did have a 1 CBG recording in the 60s per his report. Of note he forgot to bring his glucose log today. His hemoglobin A1c is 7.4 today. This is an increase from 6.7 previously. Apparently he had   Review of Systems As per history of present illness otherwise negative    Objective:   Physical Exam  Constitutional: He is oriented to person, place, and time. He appears well-developed and well-nourished. No distress.  HENT:  Head: Normocephalic and atraumatic.  Eyes: Conjunctivae normal and EOM are normal. Pupils are equal, round, and reactive to light.  Neck: Normal range of motion. Neck supple.  Cardiovascular: Normal rate, regular rhythm, normal heart sounds and intact distal pulses.   No murmur heard. Pulmonary/Chest: Effort normal and breath sounds normal. He has no wheezes. He has no rales.  Abdominal: Soft. Bowel sounds are normal. There is no tenderness.  Musculoskeletal: Normal range of motion. He exhibits no edema and no tenderness.  Neurological: He is alert and oriented to person, place, and time.  Skin: Skin is warm and dry.  Psychiatric: He has a normal mood and affect.          Assessment & Plan:   #1 diabetes mellitus: Fair control but deteriorated from previous, hemoglobin A1c 7.4, He is up-to-date on foot exam and has resolution of prior lesion between his fourth and fifth toe -Increase insulin 70/30-18 units in the morning and 14 units in the evening, advised to monitor for hypoglycemic symptoms -Patient was able to teach back instructions today via interpreter -At next visit will check basic metabolic panel to assess GFR  #2 hypertension:  Controlled on lisinopril 10 mg daily  #3 insomnia: Controlled with Ambien when necessary

## 2012-05-02 NOTE — Assessment & Plan Note (Signed)
Resolved with OTC ointment that pt brought at [facility]

## 2012-05-02 NOTE — Patient Instructions (Addendum)
Instrucciones generales: Aumentar la insulina a 18 en la maana y 57 por la noche. Busque signos de hipoglucemia. Mareos, temblores  Continuar con todos los otros medicamentos. Volver a verme en 3 meses. Llevar sus registros de Dispensing optician.   Objetivos del tratamiento:  Meta (1 aos) a Renato Gails de 20/04/2012 A Isidor Holts 12/17/11 07/24/11 07/09/11 03/27/11  Presin arterial . La presin arterial< 140/90="" 126/79="" 118/72="" 136/82="" 155/85="" 112/74="">  Componente de resultado . HEMOGLOBINA A1C< 7.0="" 7.4="" 6.7="" 6.8="" 7.2=""> . LDL CALC< 100="" 92="">    [employer] del tratamiento:  Rona Ravens 20/04/2012 Hemoglobina A1C deteriorado Presin arterial en meta   Objetivos de cuidados personales    General Instructions: Increase your insulin to 18 in the morning and 14 in the evening. Watch for signs of low blood sugar. Dizziness, shakiness or light head. Continue all of your other medications. Return to see me in 3 months. Bring your records of blood sugar.   Treatment Goals:  Goals (1 Years of Data) as of 05/02/2012          As of Today 12/17/11 07/24/11 07/09/11 03/27/11     Blood Pressure    . Blood Pressure < 140/90  126/79 118/72 136/82 155/85 112/74     Result Component    . HEMOGLOBIN A1C < 7.0  7.4 6.7  6.8 7.2    . LDL CALC < 100     92       Progress Toward Treatment Goals:  Treatment Goal 05/02/2012  Hemoglobin A1C deteriorated  Blood pressure at goal    Self Care Goals & Plans:  Self Care Goal 05/02/2012  Manage my medications bring my medications to every visit  Monitor my health keep track of my blood glucose; bring my glucose meter and log to each visit  Eat healthy foods drink diet soda or water instead of juice or soda; eat more vegetables    Home Blood Glucose Monitoring 05/02/2012  Check my blood sugar 3 times a day  When to check my blood sugar before meals     Care Management & Community  Referrals:

## 2012-05-02 NOTE — Assessment & Plan Note (Signed)
Lab Results  Component Value Date   HGBA1C 7.4 05/02/2012   HGBA1C 6.7 12/17/2011   HGBA1C 6.8 07/09/2011     Assessment:  Diabetes control: fair control  Progress toward A1C goal:  deteriorated  Comments: pt continues to self titrate 70/30 insulin, unable to afford Lantus  Plan:  Medications:  continue current medications  Home glucose monitoring:   Frequency: 3 times a day   Timing: before meals  Instruction/counseling given: reminded to bring blood glucose meter & log to each visit, reminded to bring medications to each visit and discussed foot care  Educational resources provided: brochure  Self management tools provided:    Other plans: pt taking 17 units in am and 13 units in pm, he does have a h/o hypoglycemia documented on prior glucose logs thus will only increase dosing mildly to 18 units in am and 14 units in pm; continue metformin 1000 mg bid

## 2012-06-19 ENCOUNTER — Other Ambulatory Visit: Payer: Self-pay | Admitting: Internal Medicine

## 2012-06-20 DIAGNOSIS — E11349 Type 2 diabetes mellitus with severe nonproliferative diabetic retinopathy without macular edema: Secondary | ICD-10-CM | POA: Diagnosis not present

## 2012-06-20 DIAGNOSIS — H3582 Retinal ischemia: Secondary | ICD-10-CM | POA: Diagnosis not present

## 2012-06-20 DIAGNOSIS — E1039 Type 1 diabetes mellitus with other diabetic ophthalmic complication: Secondary | ICD-10-CM | POA: Diagnosis not present

## 2012-06-20 DIAGNOSIS — E11311 Type 2 diabetes mellitus with unspecified diabetic retinopathy with macular edema: Secondary | ICD-10-CM | POA: Diagnosis not present

## 2012-07-15 DIAGNOSIS — E11319 Type 2 diabetes mellitus with unspecified diabetic retinopathy without macular edema: Secondary | ICD-10-CM | POA: Diagnosis not present

## 2012-07-15 DIAGNOSIS — H18419 Arcus senilis, unspecified eye: Secondary | ICD-10-CM | POA: Diagnosis not present

## 2012-07-15 DIAGNOSIS — H2510 Age-related nuclear cataract, unspecified eye: Secondary | ICD-10-CM | POA: Diagnosis not present

## 2012-07-16 ENCOUNTER — Other Ambulatory Visit: Payer: Self-pay | Admitting: Internal Medicine

## 2012-07-17 ENCOUNTER — Other Ambulatory Visit: Payer: Self-pay | Admitting: Internal Medicine

## 2012-07-17 DIAGNOSIS — E119 Type 2 diabetes mellitus without complications: Secondary | ICD-10-CM

## 2012-07-17 MED ORDER — INSULIN NPH ISOPHANE & REGULAR (70-30) 100 UNIT/ML ~~LOC~~ SUSP: SUBCUTANEOUS | Status: DC

## 2012-07-24 ENCOUNTER — Other Ambulatory Visit: Payer: Self-pay | Admitting: *Deleted

## 2012-07-24 DIAGNOSIS — E119 Type 2 diabetes mellitus without complications: Secondary | ICD-10-CM

## 2012-07-24 DIAGNOSIS — G4700 Insomnia, unspecified: Secondary | ICD-10-CM

## 2012-07-24 DIAGNOSIS — I10 Essential (primary) hypertension: Secondary | ICD-10-CM

## 2012-07-24 DIAGNOSIS — E785 Hyperlipidemia, unspecified: Secondary | ICD-10-CM

## 2012-07-24 MED ORDER — PRAVASTATIN SODIUM 40 MG PO TABS: 40.0000 mg | ORAL_TABLET | Freq: Every day | ORAL | Status: DC

## 2012-07-24 MED ORDER — ZOLPIDEM TARTRATE 5 MG PO TABS: 5.0000 mg | ORAL_TABLET | Freq: Every evening | ORAL | Status: DC | PRN

## 2012-07-24 MED ORDER — METFORMIN HCL 1000 MG PO TABS: 1000.0000 mg | ORAL_TABLET | Freq: Two times a day (BID) | ORAL | Status: DC

## 2012-07-24 MED ORDER — "INSULIN SYRINGE-NEEDLE U-100 31G X 5/16"" 0.5 ML MISC": Status: DC

## 2012-07-24 MED ORDER — LISINOPRIL 10 MG PO TABS: 5.0000 mg | ORAL_TABLET | Freq: Every day | ORAL | Status: DC

## 2012-08-14 ENCOUNTER — Telehealth: Payer: Self-pay | Admitting: *Deleted

## 2012-08-14 NOTE — Telephone Encounter (Signed)
Pt here at the clinic for med refills - informed pt his meds have been refilled per Dr Michail Sermon. I called [facility] in [place] to make sure they  Have current refills.  They have current refills except Zolpidem, which I gave verbal order (rx datedd 3/13).

## 2012-09-02 ENCOUNTER — Encounter: Payer: Self-pay | Admitting: Internal Medicine

## 2012-09-02 ENCOUNTER — Ambulatory Visit (INDEPENDENT_AMBULATORY_CARE_PROVIDER_SITE_OTHER): Payer: Medicare Other | Admitting: Internal Medicine

## 2012-09-02 VITALS — BP 137/74 | HR 73 | Temp 97.1°F | Ht 65.0 in | Wt 162.5 lb

## 2012-09-02 DIAGNOSIS — E785 Hyperlipidemia, unspecified: Secondary | ICD-10-CM

## 2012-09-02 DIAGNOSIS — E119 Type 2 diabetes mellitus without complications: Secondary | ICD-10-CM

## 2012-09-02 DIAGNOSIS — I10 Essential (primary) hypertension: Secondary | ICD-10-CM | POA: Diagnosis not present

## 2012-09-02 DIAGNOSIS — H269 Unspecified cataract: Secondary | ICD-10-CM | POA: Diagnosis not present

## 2012-09-02 NOTE — Assessment & Plan Note (Signed)
BP Readings from Last 3 Encounters:  09/02/12 137/74  05/02/12 126/79  12/17/11 118/72    Lab Results  Component Value Date   NA 138 03/27/2011   K 4.8 03/27/2011   CREATININE 1.32 03/27/2011    Assessment: Blood pressure control: controlled Progress toward BP goal:  at goal Comments:   Plan: Medications:  continue current medications Educational resources provided: brochure;handout Self management tools provided:   Other plans: cont lisinopril 5 mg qd

## 2012-09-02 NOTE — Assessment & Plan Note (Signed)
On pravastatin 40 mg, will check lipid panel today

## 2012-09-02 NOTE — Patient Instructions (Addendum)
General Instructions: Continue to take you medicines as instructed. [facility] will call you with a specific time for May 12th surgery. You should start taking the eye drops on May 9th as the paper instructs, then continue for 30 days. You need to pick up one more eye drop from the pharmacy. We will check your cholesterol today. Return to see me in 4 months and we will do another foot check.   Treatment Goals:  Goals (1 Years of Data) as of 09/02/12         As of Today 05/02/12 12/17/11 07/24/11 07/09/11     Blood Pressure    . Blood Pressure < 140/90  137/74 126/79 118/72 136/82      Result Component    . HEMOGLOBIN A1C < 7.0  6.7 7.4 6.7  6.8    . LDL CALC < 100      92      Progress Toward Treatment Goals:  Treatment Goal 09/02/2012  Hemoglobin A1C at goal  Blood pressure at goal    Self Care Goals & Plans:  Self Care Goal 09/02/2012  Manage my medications take my medicines as prescribed; bring my medications to every visit; refill my medications on time  Monitor my health -  Eat healthy foods drink diet soda or water instead of juice or soda; eat more vegetables; eat foods that are low in salt    Home Blood Glucose Monitoring 09/02/2012  Check my blood sugar 3 times a day  When to check my blood sugar before meals     Care Management & Community Referrals:

## 2012-09-02 NOTE — Assessment & Plan Note (Signed)
Lab Results  Component Value Date   HGBA1C 6.7 09/02/2012   HGBA1C 7.4 05/02/2012   HGBA1C 6.7 12/17/2011     Assessment: Diabetes control: good control (HgbA1C at goal) Progress toward A1C goal:  at goal Comments: no hypoglycemic events  Plan: Medications:  continue current medications Home glucose monitoring: Frequency: 3 times a day Timing: before meals Instruction/counseling given: reminded to bring blood glucose meter & log to each visit, reminded to bring medications to each visit and discussed foot care Educational resources provided: brochure Self management tools provided:   Other plans: continue novolin 70/30 18 units am and 14 units pm, cbg checks with meals tid

## 2012-09-02 NOTE — Progress Notes (Signed)
  Subjective:    Patient ID: Dustin Burns, male    DOB: June 16, 1950, 62 y.o.   MRN: [id number]  HPI  Pt presents for f/u of diabetes type 2, htn, and hyperlipidemia.  Reports that he is scheduled for cataract surgery on Sep 22, 2012 and has brought two eye drop medications of nevanac and durezol.  He states via interpretor that he is unclear about pre-op instructions. Pt is without complains.  Denies any hypoglycemic episodes on Novolin 70/30 18 units am and 14 units pm.  His cbg log demonstrates nearly all readings within normal range.  Review of Systems  Constitutional: Negative for fever and fatigue.  Respiratory: Negative for cough and shortness of breath.   Cardiovascular: Negative for chest pain.  Genitourinary: Negative for dysuria.  Neurological: Negative for syncope, weakness and headaches.       Objective:   Physical Exam  Constitutional: He is oriented to person, place, and time. He appears well-developed and well-nourished. No distress.  Pleasant, Hispanic speaking with translator present  HENT:  Head: Normocephalic and atraumatic.  Eyes: Conjunctivae and EOM are normal. Pupils are equal, round, and reactive to light.  +cataract  Neck: Neck supple.  Cardiovascular: Normal rate and normal heart sounds.   Pulmonary/Chest: Effort normal and breath sounds normal.  Abdominal: Soft. Bowel sounds are normal. There is no tenderness.  Musculoskeletal: Normal range of motion.  Neurological: He is alert and oriented to person, place, and time.  Skin: Skin is warm and dry.  Psychiatric: He has a normal mood and affect.          Assessment & Plan:  1. DM Type 2: controlled on Insulin 70/30 18 u am and 14 u pm with HgbA1c 6.7 -foot exam next visit in August  2. Cataract: contacted [facility], pt is not to start the eye drops until May 9th.  In addition Vigamox was substitued for Besivance eye drops due to need for pre-authorization. Pt was informed to pick up Vigamox at the  pharmacy and to start using the eyes drops as instructed on the Friday before his surgery (May 9th) -Surgery scheduled for May 12th with Dr. Talbert Forest, [facility] will call pt with time  3. Hyperlipidemia: will check lipid panel  4. Hypertension: controlled on lisinopril 5mg  qd

## 2012-09-03 LAB — LIPID PANEL
HDL: 38 mg/dL — ABNORMAL LOW (ref >39)
LDL Cholesterol: 62 mg/dL (ref 0–99)
VLDL: 40 mg/dL (ref 0–40)

## 2012-09-04 NOTE — Progress Notes (Signed)
Case discussed with Dr. Michail Sermon soon after the visit. We reviewed the resident's history and exam and pertinent patient test results. I agree with the assessment, diagnosis and plan of care documented in the resident's note.

## 2012-09-22 DIAGNOSIS — H269 Unspecified cataract: Secondary | ICD-10-CM | POA: Diagnosis not present

## 2012-09-22 DIAGNOSIS — H2510 Age-related nuclear cataract, unspecified eye: Secondary | ICD-10-CM | POA: Diagnosis not present

## 2012-11-21 DIAGNOSIS — H2510 Age-related nuclear cataract, unspecified eye: Secondary | ICD-10-CM | POA: Diagnosis not present

## 2012-12-19 DIAGNOSIS — H2510 Age-related nuclear cataract, unspecified eye: Secondary | ICD-10-CM | POA: Diagnosis not present

## 2012-12-19 DIAGNOSIS — H269 Unspecified cataract: Secondary | ICD-10-CM | POA: Diagnosis not present

## 2012-12-22 ENCOUNTER — Encounter: Payer: Self-pay | Admitting: Internal Medicine

## 2012-12-22 ENCOUNTER — Ambulatory Visit (INDEPENDENT_AMBULATORY_CARE_PROVIDER_SITE_OTHER): Payer: Medicare Other | Admitting: Internal Medicine

## 2012-12-22 VITALS — BP 145/85 | HR 88 | Temp 97.2°F | Ht 63.5 in | Wt 158.1 lb

## 2012-12-22 DIAGNOSIS — H269 Unspecified cataract: Secondary | ICD-10-CM

## 2012-12-22 DIAGNOSIS — E1149 Type 2 diabetes mellitus with other diabetic neurological complication: Secondary | ICD-10-CM

## 2012-12-22 DIAGNOSIS — G4700 Insomnia, unspecified: Secondary | ICD-10-CM

## 2012-12-22 DIAGNOSIS — E785 Hyperlipidemia, unspecified: Secondary | ICD-10-CM | POA: Diagnosis not present

## 2012-12-22 DIAGNOSIS — E119 Type 2 diabetes mellitus without complications: Secondary | ICD-10-CM

## 2012-12-22 DIAGNOSIS — Z0000 Encounter for general adult medical examination without abnormal findings: Secondary | ICD-10-CM

## 2012-12-22 DIAGNOSIS — I10 Essential (primary) hypertension: Secondary | ICD-10-CM | POA: Diagnosis not present

## 2012-12-22 LAB — POCT GLYCOSYLATED HEMOGLOBIN (HGB A1C): Hemoglobin A1C: 6.3

## 2012-12-22 MED ORDER — ONETOUCH DELICA LANCETS FINE MISC: 1.0000 | Freq: Three times a day (TID) | Status: AC

## 2012-12-22 MED ORDER — PRAVASTATIN SODIUM 40 MG PO TABS: 40.0000 mg | ORAL_TABLET | Freq: Every day | ORAL | Status: DC

## 2012-12-22 MED ORDER — LISINOPRIL 10 MG PO TABS: 5.0000 mg | ORAL_TABLET | Freq: Every day | ORAL | Status: DC

## 2012-12-22 MED ORDER — METFORMIN HCL 1000 MG PO TABS: 1000.0000 mg | ORAL_TABLET | Freq: Two times a day (BID) | ORAL | Status: DC

## 2012-12-22 NOTE — Assessment & Plan Note (Signed)
Surgery on right eye ~ 1 week ago, surgery on left eye May 2014.  Pt reports good vision currently.

## 2012-12-22 NOTE — Progress Notes (Signed)
  Subjective:    Patient ID: Dustin Burns, male    DOB: [birth year], 62 y.o.   MRN: [id number]  HPI  Dustin Burns is a pleasant Spanish-speaking only gentleman who presents for routine f/u of diabetes. He states that his glucometer is broken and cannot download readings but has brought in a handwritten log.  Log recordings are without hypoglycemic documentation which has been of concern in the past.  Several reading in the 200s.  Review of Systems  Constitutional: Negative for appetite change and fatigue.  HENT: Negative for congestion.   Eyes: Negative for visual disturbance.  Respiratory: Negative for chest tightness and shortness of breath.   Cardiovascular: Negative for chest pain and leg swelling.  Gastrointestinal: Negative for nausea, abdominal pain, diarrhea, constipation and blood in stool.  Genitourinary: Negative for dysuria.  Skin: Negative for rash and wound.  Neurological: Negative for weakness, light-headedness and headaches.       Objective:   Physical Exam  Constitutional: He is oriented to person, place, and time. He appears well-developed and well-nourished. No distress.  HENT:  Head: Normocephalic and atraumatic.  Eyes: Conjunctivae and EOM are normal. Pupils are equal, round, and reactive to light.  Neck: Normal range of motion. Neck supple. No thyromegaly present.  Cardiovascular: Normal rate, regular rhythm, normal heart sounds and intact distal pulses.   Pulmonary/Chest: Effort normal and breath sounds normal.  Abdominal: Soft. Bowel sounds are normal. He exhibits no distension. There is no tenderness.  Musculoskeletal: Normal range of motion. He exhibits no edema.  Neurological: He is alert and oriented to person, place, and time.  Skin: Skin is warm and dry.  Psychiatric: He has a normal mood and affect.          Assessment & Plan:  1. 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  Will  again recommend 15 Units in pm of Novolin 70/30.  His HgbA1c is indicative of good glycemic control. -Consult with DM coordinator re: glucometer selection -DM foot exam today -eye surgery this past May 2014 for retinopathy and cataracts  2. Htn: bp recheck at 145/85, on lisinopril 5 mg qd  3. Hyperlipidemia: controlled on pravastatin 40 mg qd  Lipid Panel     Component Value Date/Time   CHOL 140 09/02/2012 1510   TRIG 202* 09/02/2012 1510   HDL 38* 09/02/2012 1510   CHOLHDL 3.7 09/02/2012 1510   VLDL 40 09/02/2012 1510   LDLCALC 62 09/02/2012 1510   4. Insomnia: improved with elevation of stressors

## 2012-12-22 NOTE — Assessment & Plan Note (Addendum)
Lab Results  Component Value Date   HGBA1C 6.3 12/22/2012   HGBA1C 6.7 09/02/2012   HGBA1C 7.4 05/02/2012     Assessment: Diabetes control: good control (HgbA1C at goal) Progress toward A1C goal:  at goal Comments: Had not increased 70/30 to 15 units as instructed, Hgba!c at goal but evening cbgs high  Plan: Medications:  continue current medications Home glucose monitoring: Frequency: 3 times a day Timing: before meals Instruction/counseling given: reminded to bring blood glucose meter & log to each visit, reminded to bring medications to each visit and discussed foot care Educational resources provided: brochure;handout Self management tools provided: copy of home glucose meter download Other plans: Novolin 70/30 18 units am and 15 units pm -foot check today -lancets prescription sent to pharmacy -consult and new glucometer given by DME [facility]

## 2012-12-22 NOTE — Patient Instructions (Addendum)
General Instructions: Increase your evening insulin to 15 units at night. Continue to check your blood sugar at least twice a day but 3 times would be ideal. Bring the meter with you on your return visit. See me in 3-4 months. At that time we will recheck your blood pressure. If it is elevated, we will increase your blood pressure medicine.   Treatment Goals:  Goals (1 Years of Data) as of 12/22/12         As of Today As of Today 09/02/12 05/02/12 12/17/11     Blood Pressure    . Blood Pressure < 140/90  145/85 152/81 137/74 126/79 118/72     Result Component    . HEMOGLOBIN A1C < 7.0  6.3  6.7 7.4 6.7    . LDL CALC < 100    62        Progress Toward Treatment Goals:  Treatment Goal 12/22/2012  Hemoglobin A1C at goal  Blood pressure deteriorated    Self Care Goals & Plans:  Self Care Goal 12/22/2012  Manage my medications take my medicines as prescribed; bring my medications to every visit; refill my medications on time; follow the sick day instructions if I am sick  Monitor my health keep track of my blood glucose; bring my glucose meter and log to each visit; keep track of my blood pressure; check my feet daily  Eat healthy foods eat more vegetables; eat fruit for snacks and desserts; eat baked foods instead of fried foods; eat smaller portions; drink diet soda or water instead of juice or soda  Be physically active take a walk every day; find an activity I enjoy    Home Blood Glucose Monitoring 12/22/2012  Check my blood sugar 3 times a day  When to check my blood sugar before meals     Care Management & Community Referrals:  Referral 12/22/2012  Referrals made for care management support diabetes educator

## 2012-12-22 NOTE — Assessment & Plan Note (Signed)
Reports improvement and no need for Ambien since having eye surgery.  Poor eye sight caused him much stress.

## 2012-12-22 NOTE — Assessment & Plan Note (Signed)
BP Readings from Last 3 Encounters:  12/22/12 145/85  09/02/12 137/74  05/02/12 126/79    Lab Results  Component Value Date   NA 138 03/27/2011   K 4.8 03/27/2011   CREATININE 1.32 03/27/2011    Assessment: Blood pressure control: mildly elevated Progress toward BP goal:  deteriorated Comments: rechecked sbp decrease from 152 to 169mmHg  Plan: Medications:  continue current medications Educational resources provided: brochure;handout;video Self management tools provided:   Other plans: will continue lisinopril 5 mg qd, f/u at next visit and consider increase to lisinopril 10 mg if bp elevated

## 2012-12-23 MED ORDER — GLUCOSE BLOOD VI STRP: ORAL_STRIP | Status: DC

## 2012-12-23 NOTE — Progress Notes (Signed)
Case discussed with Dr. Schooler soon after the resident saw the patient.  We reviewed the resident's history and exam and pertinent patient test results.  I agree with the assessment, diagnosis, and plan of care documented in the resident's note. 

## 2012-12-24 ENCOUNTER — Other Ambulatory Visit: Payer: Self-pay | Admitting: Dietician

## 2012-12-24 DIAGNOSIS — E119 Type 2 diabetes mellitus without complications: Secondary | ICD-10-CM

## 2012-12-24 NOTE — Telephone Encounter (Signed)
[facility] requested via fax from triage nurse a brand of test strips, times a day he tests and say they can only accept 5 refills.

## 2012-12-29 MED ORDER — GLUCOSE BLOOD VI STRP: ORAL_STRIP | Status: DC

## 2013-01-19 ENCOUNTER — Other Ambulatory Visit: Payer: Self-pay | Admitting: Internal Medicine

## 2013-01-27 DIAGNOSIS — E11359 Type 2 diabetes mellitus with proliferative diabetic retinopathy without macular edema: Secondary | ICD-10-CM | POA: Diagnosis not present

## 2013-01-27 DIAGNOSIS — H3582 Retinal ischemia: Secondary | ICD-10-CM | POA: Diagnosis not present

## 2013-01-27 DIAGNOSIS — E1039 Type 1 diabetes mellitus with other diabetic ophthalmic complication: Secondary | ICD-10-CM | POA: Diagnosis not present

## 2013-01-27 DIAGNOSIS — E11311 Type 2 diabetes mellitus with unspecified diabetic retinopathy with macular edema: Secondary | ICD-10-CM | POA: Diagnosis not present

## 2013-01-28 ENCOUNTER — Other Ambulatory Visit: Payer: Self-pay | Admitting: Internal Medicine

## 2013-02-05 ENCOUNTER — Telehealth: Payer: Self-pay | Admitting: Dietician

## 2013-02-05 NOTE — Telephone Encounter (Signed)
Called patient using language line. Patient did not know name of eye doctor. Wasn't able to get strips to check blood sugar. Called pharmacy- they do not have his Medicare B information on file. Called patient and asked him to take this information to them and call us back if his test strips ares till not covered.

## 2013-02-05 NOTE — Telephone Encounter (Signed)
Dr Talbert Forest of [facility].

## 2013-02-09 ENCOUNTER — Telehealth: Payer: Self-pay | Admitting: Dietician

## 2013-02-09 NOTE — Telephone Encounter (Signed)
Called Dr. Talbert Forest, he only does cataract surgery, they said Dr. Sherlynn Stalls is his diabetes eye doctor. Called dr. Baird Cancer and patient was there onSept 16, 2014 and is due again on  February 13, 2013. They will fax [place] the results

## 2013-02-13 DIAGNOSIS — E11311 Type 2 diabetes mellitus with unspecified diabetic retinopathy with macular edema: Secondary | ICD-10-CM | POA: Diagnosis not present

## 2013-02-13 NOTE — Telephone Encounter (Signed)
Called patient's pharmacy to follow up on test strips. Last week they had said they need his Medicare B card to run the strips through. Called patient at that time using language line interpreter and asked him to take this information to his pharmacy. Today, CDE was assured that he got 100 [employer] verio strips last month for 4$.

## 2013-03-11 DIAGNOSIS — E11311 Type 2 diabetes mellitus with unspecified diabetic retinopathy with macular edema: Secondary | ICD-10-CM | POA: Diagnosis not present

## 2013-03-23 ENCOUNTER — Ambulatory Visit (INDEPENDENT_AMBULATORY_CARE_PROVIDER_SITE_OTHER): Payer: Medicare Other | Admitting: Internal Medicine

## 2013-03-23 ENCOUNTER — Encounter: Payer: Self-pay | Admitting: Internal Medicine

## 2013-03-23 VITALS — BP 151/81 | HR 73 | Temp 97.2°F | Ht 63.0 in | Wt 159.4 lb

## 2013-03-23 DIAGNOSIS — E119 Type 2 diabetes mellitus without complications: Secondary | ICD-10-CM

## 2013-03-23 DIAGNOSIS — L309 Dermatitis, unspecified: Secondary | ICD-10-CM

## 2013-03-23 DIAGNOSIS — E785 Hyperlipidemia, unspecified: Secondary | ICD-10-CM

## 2013-03-23 DIAGNOSIS — Z23 Encounter for immunization: Secondary | ICD-10-CM | POA: Diagnosis not present

## 2013-03-23 DIAGNOSIS — I10 Essential (primary) hypertension: Secondary | ICD-10-CM | POA: Diagnosis not present

## 2013-03-23 DIAGNOSIS — L259 Unspecified contact dermatitis, unspecified cause: Secondary | ICD-10-CM

## 2013-03-23 MED ORDER — HYDROCORTISONE 1 % EX CREA: TOPICAL_CREAM | CUTANEOUS | Status: DC

## 2013-03-23 MED ORDER — INSULIN GLARGINE 100 UNIT/ML SOLOSTAR PEN: 15.0000 [IU] | PEN_INJECTOR | Freq: Every day | SUBCUTANEOUS | Status: DC

## 2013-03-23 MED ORDER — PRAVASTATIN SODIUM 40 MG PO TABS: 40.0000 mg | ORAL_TABLET | Freq: Every day | ORAL | Status: DC

## 2013-03-23 MED ORDER — INSULIN PEN NEEDLE 31G X 6 MM MISC: Status: DC

## 2013-03-23 NOTE — Assessment & Plan Note (Signed)
BP Readings from Last 3 Encounters:  03/23/13 151/81  12/22/12 145/85  09/02/12 137/74    Lab Results  Component Value Date   NA 138 03/27/2011   K 4.8 03/27/2011   CREATININE 1.32 03/27/2011    Assessment: Blood pressure control: mildly elevated Progress toward BP goal:  deteriorated Comments: took cholesterol medication instead of bp med today  Plan: Medications:  continue current medications Educational resources provided:   Self management tools provided:   Other plans: lisinopril 5 mg qd

## 2013-03-23 NOTE — Progress Notes (Signed)
  Subjective:    Patient ID: Dustin Burns, male    DOB: 06-Mar-1951, 62 y.o.   MRN: [id number]  HPI  Presents for routine diabetes and hypertension follow-up.Needs refills.  Would like to use Lantus pens from vials. States that he runs out of the vial fast but the pens last longer.  Has been using an old Lantus pen for the past week or so.  Glucometer with one low reading of 65.  Pt states that happens when he doesn't awake until after 9am. Requesting flu shot.  Complains of left sided rash that has been itchy.  Concerned that it may be shingles.   Review of Systems  Constitutional: Negative for fever and fatigue.  HENT: Negative for congestion.        Itchy neck on left side  Eyes: Negative for visual disturbance.  Respiratory: Negative for chest tightness and shortness of breath.   Cardiovascular: Negative for chest pain and leg swelling.  Gastrointestinal: Negative for diarrhea and constipation.  Genitourinary: Negative for dysuria.  Musculoskeletal: Negative for arthralgias and back pain.  Neurological: Negative for dizziness, weakness and headaches.  Hematological: Does not bruise/bleed easily.  Psychiatric/Behavioral: Negative for confusion.       Objective:   Physical Exam  Constitutional: He is oriented to person, place, and time. He appears well-developed and well-nourished. No distress.  HENT:  Head: Normocephalic and atraumatic.  Eyes: Conjunctivae and EOM are normal. Pupils are equal, round, and reactive to light.  Neck: Normal range of motion. Neck supple. No thyromegaly present.  Mild erythema encircling entire neck. Left side of neck with a few excoriated lesions. Skin surface is rough over entire neck. No dermatomal pattern. No fluid filled vesicles. No scaling. Pt is wearing gold colored chain.  Cardiovascular: Normal rate, regular rhythm, normal heart sounds and intact distal pulses.   No murmur heard. Pulmonary/Chest: Effort normal and breath sounds normal. He has  no wheezes.  Abdominal: Soft. Bowel sounds are normal. There is no tenderness.  Musculoskeletal: Normal range of motion. He exhibits no edema and no tenderness.  Lymphadenopathy:    He has no cervical adenopathy.  Neurological: He is alert and oriented to person, place, and time.  Skin: Skin is warm and dry. Rash noted. There is erythema.  Psychiatric: He has a normal mood and affect.          Assessment & Plan:  See separate problem based charting:  #1 DM Type 2 controlled: HgbA1c 6.7 -will resume Lantus, start at 15 units qhs and titrate upwards given h/o hypoglycemic episodes -reviewed symptoms of hypoglycemia, pt to call clinic for elevated of hypoglycemic cbgs  #2 Hypertension: above goal today, pt had not taken lisinopril  #3 Preventative Care: flu shot today  #4 neck irritation, likely contact dermatitis: hydrocortisone cream, advice to stop wearing necklace and monitor for improvement

## 2013-03-23 NOTE — Patient Instructions (Addendum)
General Instructions: We will restart the Lantus pen.   Take 15 units at bedtime. If your blood sugar increases or it get too low (less than 70) please call the clinic so that we can adjust your dosage. Apply the itch cream to your nec twice a day.  If it gets worse or no better, let me know. Follow-up with me in 3-4 months.   Treatment Goals:  Goals (1 Years of Data) as of 03/23/13         As of Today 12/22/12 12/22/12 09/02/12 05/02/12     Blood Pressure    . Blood Pressure < 140/90  151/81 145/85 152/81 137/74 126/79     Result Component    . HEMOGLOBIN A1C < 7.0  6.5 6.3  6.7 7.4    . LDL CALC < 100     62       Progress Toward Treatment Goals:  Treatment Goal 03/23/2013  Hemoglobin A1C at goal  Blood pressure deteriorated    Self Care Goals & Plans:  Self Care Goal 03/23/2013  Manage my medications take my medicines as prescribed; bring my medications to every visit; refill my medications on time  Monitor my health keep track of my blood glucose; bring my glucose meter and log to each visit; check my feet daily  Eat healthy foods eat baked foods instead of fried foods; eat foods that are low in salt; drink diet soda or water instead of juice or soda  Be physically active -    Home Blood Glucose Monitoring 03/23/2013  Check my blood sugar 3 times a day  When to check my blood sugar before meals     Care Management & Community Referrals:  Referral 12/22/2012  Referrals made for care management support diabetes educator

## 2013-03-23 NOTE — Assessment & Plan Note (Signed)
Lab Results  Component Value Date   HGBA1C 6.5 03/23/2013   HGBA1C 6.3 12/22/2012   HGBA1C 6.7 09/02/2012     Assessment: Diabetes control: good control (HgbA1C at goal) Progress toward A1C goal:  at goal Comments: requesting to resume lantus pen  Plan: Medications:  will start back on Lantus 15 units qhs Home glucose monitoring: Frequency: 3 times a day Timing: before meals Instruction/counseling given: reminded to bring blood glucose meter & log to each visit Educational resources provided:   Self management tools provided: copy of home glucose meter download Other plans: Lantus 15 units qhs, pt to call if elevated cbgs and we will titrate up as needed, h/o hypoglycemia

## 2013-03-23 NOTE — Assessment & Plan Note (Signed)
Refilled pravastatin today

## 2013-03-24 DIAGNOSIS — L309 Dermatitis, unspecified: Secondary | ICD-10-CM | POA: Insufficient documentation

## 2013-03-24 NOTE — Assessment & Plan Note (Signed)
Trial of hydrocortisone cream

## 2013-03-25 NOTE — Progress Notes (Signed)
Case discussed with Dr. Michail Sermon soon after the resident saw the patient.  We reviewed the resident's history and exam and pertinent patient test results.  I agree with the assessment, diagnosis and plan of care documented in the resident's note.

## 2013-05-29 ENCOUNTER — Telehealth: Payer: Self-pay | Admitting: *Deleted

## 2013-05-29 NOTE — Telephone Encounter (Signed)
Call to [place] at  [phone] for Prior Approval for Lantus Solostar Insulin approved 05/29/2013 thru 05/13/2014.  Called to Geraldine at [phone] in [place].  Pt here and was informed of.  Sander Nephew, RN 05/29/2013 10:51 AM.

## 2013-06-10 DIAGNOSIS — E11359 Type 2 diabetes mellitus with proliferative diabetic retinopathy without macular edema: Secondary | ICD-10-CM | POA: Diagnosis not present

## 2013-06-10 DIAGNOSIS — E11311 Type 2 diabetes mellitus with unspecified diabetic retinopathy with macular edema: Secondary | ICD-10-CM | POA: Diagnosis not present

## 2013-06-10 DIAGNOSIS — E1039 Type 1 diabetes mellitus with other diabetic ophthalmic complication: Secondary | ICD-10-CM | POA: Diagnosis not present

## 2013-07-20 ENCOUNTER — Ambulatory Visit (INDEPENDENT_AMBULATORY_CARE_PROVIDER_SITE_OTHER): Payer: Medicare Other | Admitting: Internal Medicine

## 2013-07-20 VITALS — BP 144/83 | HR 71 | Temp 97.5°F | Ht 63.5 in | Wt 154.2 lb

## 2013-07-20 DIAGNOSIS — E739 Lactose intolerance, unspecified: Secondary | ICD-10-CM | POA: Insufficient documentation

## 2013-07-20 DIAGNOSIS — F528 Other sexual dysfunction not due to a substance or known physiological condition: Secondary | ICD-10-CM | POA: Diagnosis not present

## 2013-07-20 DIAGNOSIS — K5900 Constipation, unspecified: Secondary | ICD-10-CM

## 2013-07-20 DIAGNOSIS — K219 Gastro-esophageal reflux disease without esophagitis: Secondary | ICD-10-CM | POA: Diagnosis not present

## 2013-07-20 DIAGNOSIS — R198 Other specified symptoms and signs involving the digestive system and abdomen: Secondary | ICD-10-CM

## 2013-07-20 DIAGNOSIS — R194 Change in bowel habit: Secondary | ICD-10-CM | POA: Insufficient documentation

## 2013-07-20 DIAGNOSIS — I10 Essential (primary) hypertension: Secondary | ICD-10-CM

## 2013-07-20 DIAGNOSIS — E119 Type 2 diabetes mellitus without complications: Secondary | ICD-10-CM

## 2013-07-20 LAB — POCT GLYCOSYLATED HEMOGLOBIN (HGB A1C): HEMOGLOBIN A1C: 7.4

## 2013-07-20 LAB — GLUCOSE, CAPILLARY: GLUCOSE-CAPILLARY: 153 mg/dL — AB (ref 70–99)

## 2013-07-20 NOTE — Progress Notes (Signed)
Interpreter [name] for Dr Elgie Congo

## 2013-07-20 NOTE — Assessment & Plan Note (Signed)
Takes OTC PPI ("from $1 store)

## 2013-07-20 NOTE — Assessment & Plan Note (Signed)
Concern for possible IBS-mixed.  Hx difficult to follow at times with wife and husband giving conflicting versions per Spanish interpretor but generally it appears as though pt is having constipation or diarrhea every 2-3 days for months at the minimum.  Colonoscopy 2012 per Carlean Purl. Reports intolerance to dairy. -will RX Miralax in the interim for constipation (pt often takes homeopathic cures involving "oil" or chocolate to have bowel movements)

## 2013-07-20 NOTE — Progress Notes (Signed)
   Subjective:    Patient ID: Login Brunken, male    DOB: [birth year], 63 y.o.   MRN: [id number]  HPI  Mr. Habibi presents for diabetes f/u.  Hx significant for well-controlled diabetes with neuropathy and hypertension. Glucose log demonstrating evening cbg 160-220s. And morning 120s with one hypoglycemic recording of 61.  Pt has been taking the Lantus bid instead of qd as prescribed.  States that he has been using Lantus 10 units in the am and Lantus 8 units in the pm.  Preferrs to cont using it twice a day and does not want to return to Novolog 70/30 because "this is more powerful".    Requesting refill of Viagra.  Denies chest pain, shortness of breath, pre-syncope.  A great deal of the visit was spent clarifying his gastrointestinal issues.  Per Patent attorney interspesred with comments from both the wife and patient, he has frequent constipation followed by diarrhea every 2 -3 days.  This is associated with bloating without abdominal pain.  States that this has been an ongoing issues for years even prior to his last colonoscopy in 2012.  Colonoscopy per Dr. Carlean Purl at that time with tubular adenomatous polyps.  He states that he does not tolerate dairy and often uses some type of homeopathic remedy which consists of oil to relieve his constipation. Denies blood or mucus in stool, hemorrhoids but does endorse frequent bloating.  Of note, Pt's wife is more forth coming of all of these issues and is requesting for him to "finally see a stomach doctor".   Review of Systems  Constitutional: Negative.   HENT: Negative.   Eyes: Negative.   Respiratory: Negative.   Cardiovascular: Negative.   Gastrointestinal: Negative.   Endocrine: Negative.   Genitourinary: Negative.   Neurological: Negative.   Psychiatric/Behavioral: Negative.        Objective:   Physical Exam  Constitutional: He is oriented to person, place, and time. He appears well-developed and well-nourished. No distress.    Spanish speaking for the most part but understands basic English.  HENT:  Head: Normocephalic and atraumatic.  Eyes: Conjunctivae and EOM are normal. Pupils are equal, round, and reactive to light.  Neck: Normal range of motion. Neck supple.  Cardiovascular: Normal rate, regular rhythm, normal heart sounds and intact distal pulses.   No murmur heard. Pulmonary/Chest: Effort normal and breath sounds normal.  Abdominal: Soft. Bowel sounds are normal. There is no tenderness.  Musculoskeletal: Normal range of motion. He exhibits no edema and no tenderness.  Neurological: He is alert and oriented to person, place, and time.  Skin: Skin is warm and dry.  Psychiatric: He has a normal mood and affect.          Assessment & Plan:  See problem based listing:

## 2013-07-20 NOTE — Assessment & Plan Note (Signed)
Refill of Viagra requested. No chest pain or worrisome sx.

## 2013-07-20 NOTE — Patient Instructions (Signed)
General Instructions: We have refilled your medications including Viagra. We will refer you to a stomach doctor. Follow-up with me in 3 months.   Treatment Goals:  Goals (1 Years of Data) as of 07/20/13         As of Today 03/23/13 12/22/12 12/22/12 09/02/12     Blood Pressure    . Blood Pressure < 140/90  144/83 151/81 145/85 152/81 137/74     Result Component    . HEMOGLOBIN A1C < 7.0  7.4 6.5 6.3  6.7    . LDL CALC < 100      62      Progress Toward Treatment Goals:  Treatment Goal 07/20/2013  Hemoglobin A1C unchanged  Blood pressure at goal    Self Care Goals & Plans:  Self Care Goal 07/20/2013  Manage my medications take my medicines as prescribed; bring my medications to every visit; refill my medications on time  Monitor my health keep track of my blood glucose; bring my glucose meter and log to each visit  Eat healthy foods eat foods that are low in salt; eat baked foods instead of fried foods; drink diet soda or water instead of juice or soda  Be physically active find an activity I enjoy    Home Blood Glucose Monitoring 03/23/2013  Check my blood sugar 3 times a day  When to check my blood sugar before meals     Care Management & Community Referrals:  Referral 12/22/2012  Referrals made for care management support diabetes educator

## 2013-07-20 NOTE — Assessment & Plan Note (Addendum)
Will RX Miralax for constipation in the interim until evaluated by GI. Question of weather metformin should be discontinued given gi issues although his issues seem to pre-date staring metformin in 2012. There is also concern that he is slightly anemic thus will repeat cbc on f/u and defer to GI as to weather he should have further GI work-up given prior colonoscopy results. -scheduled 09/02/13 Dr. Carlean Purl (GI)

## 2013-07-20 NOTE — Assessment & Plan Note (Signed)
BP Readings from Last 3 Encounters:  07/20/13 144/83  03/23/13 151/81  12/22/12 145/85    Lab Results  Component Value Date   NA 138 03/27/2011   K 4.8 03/27/2011   CREATININE 1.32 03/27/2011    Assessment: Blood pressure control: controlled Progress toward BP goal:  at goal Comments:   Plan: Medications:  continue current medications Educational resources provided:   Self management tools provided:   Other plans: cont lisinopril 10 mg qd

## 2013-07-20 NOTE — Assessment & Plan Note (Signed)
Lab Results  Component Value Date   HGBA1C 7.4 07/20/2013   HGBA1C 6.5 03/23/2013   HGBA1C 6.3 12/22/2012     Assessment: Diabetes control: fair control Progress toward A1C goal:  unchanged Comments: has started taking Lantus twice a day, cbgs often running in 200s in the evening  Plan: Medications:  continue current medications Home glucose monitoring: Frequency:   Timing:   Instruction/counseling given: reminded to bring blood glucose meter & log to each visit and reminded to bring medications to each visit Educational resources provided: handout Self management tools provided: copy of home glucose meter download Other plans: pt preferring to take Lantus bid instead of qd, will increase from 10 units in am to 12 units, keep 8 units in the evening as pt had low reading at 61 x 1 and has hx of hypoglycemia

## 2013-07-21 ENCOUNTER — Other Ambulatory Visit: Payer: Self-pay | Admitting: Internal Medicine

## 2013-07-21 ENCOUNTER — Encounter: Payer: Self-pay | Admitting: Internal Medicine

## 2013-07-22 NOTE — Progress Notes (Signed)
Case discussed with Dr. Schooler soon after the resident saw the patient.  We reviewed the resident's history and exam and pertinent patient test results.  I agree with the assessment, diagnosis, and plan of care documented in the resident's note. 

## 2013-09-02 ENCOUNTER — Encounter: Payer: Self-pay | Admitting: Internal Medicine

## 2013-09-02 ENCOUNTER — Other Ambulatory Visit (INDEPENDENT_AMBULATORY_CARE_PROVIDER_SITE_OTHER): Payer: Medicare Other

## 2013-09-02 ENCOUNTER — Ambulatory Visit (INDEPENDENT_AMBULATORY_CARE_PROVIDER_SITE_OTHER): Payer: Medicare Other | Admitting: Internal Medicine

## 2013-09-02 VITALS — BP 162/84 | HR 64 | Ht 63.5 in | Wt 151.8 lb

## 2013-09-02 DIAGNOSIS — R634 Abnormal weight loss: Secondary | ICD-10-CM

## 2013-09-02 DIAGNOSIS — R142 Eructation: Secondary | ICD-10-CM

## 2013-09-02 DIAGNOSIS — R143 Flatulence: Secondary | ICD-10-CM | POA: Diagnosis not present

## 2013-09-02 DIAGNOSIS — R197 Diarrhea, unspecified: Secondary | ICD-10-CM

## 2013-09-02 DIAGNOSIS — R140 Abdominal distension (gaseous): Secondary | ICD-10-CM

## 2013-09-02 DIAGNOSIS — R141 Gas pain: Secondary | ICD-10-CM | POA: Diagnosis not present

## 2013-09-02 LAB — COMPREHENSIVE METABOLIC PANEL
ALK PHOS: 60 U/L (ref 39–117)
ALT: 12 U/L (ref 0–53)
AST: 12 U/L (ref 0–37)
Albumin: 4 g/dL (ref 3.5–5.2)
BUN: 22 mg/dL (ref 6–23)
CO2: 28 mEq/L (ref 19–32)
CREATININE: 1.2 mg/dL (ref 0.4–1.5)
Calcium: 10 mg/dL (ref 8.4–10.5)
Chloride: 109 mEq/L (ref 96–112)
GFR: 62.9 mL/min (ref >60.00)
Glucose, Bld: 94 mg/dL (ref 70–99)
Potassium: 5.5 mEq/L — ABNORMAL HIGH (ref 3.5–5.1)
Sodium: 142 mEq/L (ref 135–145)
Total Bilirubin: 0.6 mg/dL (ref 0.3–1.2)
Total Protein: 6.9 g/dL (ref 6.0–8.3)

## 2013-09-02 LAB — CBC WITH DIFFERENTIAL/PLATELET
BASOS ABS: 0 10*3/uL (ref 0.0–0.1)
Basophils Relative: 0.4 % (ref 0.0–3.0)
Eosinophils Absolute: 0.1 10*3/uL (ref 0.0–0.7)
Eosinophils Relative: 1.2 % (ref 0.0–5.0)
HCT: 35.5 % — ABNORMAL LOW (ref 39.0–52.0)
Hemoglobin: 11.8 g/dL — ABNORMAL LOW (ref 13.0–17.0)
LYMPHS ABS: 1.4 10*3/uL (ref 0.7–4.0)
Lymphocytes Relative: 19.9 % (ref 12.0–46.0)
MCHC: 33.2 g/dL (ref 30.0–36.0)
MCV: 88.9 fl (ref 78.0–100.0)
MONO ABS: 0.4 10*3/uL (ref 0.1–1.0)
Monocytes Relative: 6.3 % (ref 3.0–12.0)
Neutro Abs: 5.2 10*3/uL (ref 1.4–7.7)
Neutrophils Relative %: 72.2 % (ref 43.0–77.0)
Platelets: 199 10*3/uL (ref 150.0–400.0)
RBC: 3.99 Mil/uL — ABNORMAL LOW (ref 4.22–5.81)
RDW: 13.4 % (ref 11.5–14.6)
WBC: 7.2 10*3/uL (ref 4.5–10.5)

## 2013-09-02 LAB — TSH: TSH: 1.7 u[IU]/mL (ref 0.35–5.50)

## 2013-09-02 MED ORDER — DICYCLOMINE HCL 20 MG PO TABS: 20.0000 mg | ORAL_TABLET | Freq: Three times a day (TID) | ORAL | Status: DC

## 2013-09-02 NOTE — Patient Instructions (Addendum)
We have sent the following medications to your pharmacy for you to pick up at your convenience: [facility] has requested that you go to the basement for the following lab work before leaving today: CBC/diff, TSH, CMET, stool studies  Please purchase a probiotic to put the good bacteria back in your colon.  Take one daily.  $5.00 off coupon provided today for Align.  Today we are giving you information in spanish on IBS.  I appreciate the opportunity to care for you.

## 2013-09-02 NOTE — Progress Notes (Signed)
Subjective:    Patient ID: Dustin Burns, male    DOB: Jul 01, 1950, 63 y.o.   MRN: [id number]  HPI The patient is a pleasant [place] man here with an interpreter, I know him from previous colonoscopy in 2012 he had 2 small rectal adenomas. He says in December after eating a tamale he developed postprandial epigastric pain an urgent defecation. This has persisted. He tried [employer] one point because he felt somewhat constipated. He reports a 16 pound weight loss over time though that is not substantiated in her records he has lost some weight. He feels very bloated at times. Often after eating. He denies any bleeding. He has not tried any over-the-counter medications other than what is described. He does think he is probably intolerant to dairy, I forgot to ask her if she eats a large amount of sugar-free candy but he clearly describe the onset of symptoms and problems after eating the food described above. Takes metformin been on that for 16 years and doesn't think that's the problem. There's been no dose change. No recent antibiotics.  Medications, allergies, past medical history, past surgical history, family history and social history are reviewed and updated in the EMR.    Review of Systems As per history of present illness and positive for itching, allergies and joint pain. All other review of systems negative or per history of present illness    Objective:   Physical Exam General:  Well-developed, well-nourished and in no acute distress Eyes:  anicteric. ENT:   Mouth and posterior pharynx free of lesions. + dental work and caries Neck:   supple w/o thyromegaly or mass.  Lungs: Clear to auscultation bilaterally. Heart:  S1S2, no rubs, murmurs, gallops. Abdomen:  soft, non-tender, no hepatosplenomegaly, hernia, or mass and BS+.  Rectal: Normal anoderm, no mass, prostate smooth and firm, soft-formed brown stool heme neg Lymph:  no cervical or supraclavicular  adenopathy. Extremities:   no edema Skin   no rash. Neuro:  A&O x 3.  Psych:  appropriate mood and  Affect.   Data Reviewed: Lab Results  Component Value Date   WBC 7.2 09/02/2013   HGB 11.8* 09/02/2013   HCT 35.5* 09/02/2013   MCV 88.9 09/02/2013   PLT 199.0 09/02/2013     Chemistry      Component Value Date/Time   NA 142 09/02/2013 1009   K 5.5* 09/02/2013 1009   CL 109 09/02/2013 1009   CO2 28 09/02/2013 1009   BUN 22 09/02/2013 1009   CREATININE 1.2 09/02/2013 1009   CREATININE 1.32 03/27/2011 1005      Component Value Date/Time   CALCIUM 10.0 09/02/2013 1009   ALKPHOS 60 09/02/2013 1009   AST 12 09/02/2013 1009   ALT 12 09/02/2013 1009   BILITOT 0.6 09/02/2013 1009     Lab Results  Component Value Date   TSH 1.70 09/02/2013    Wt Readings from Last 3 Encounters:  09/02/13 151 lb 12.8 oz (68.856 kg)  07/20/13 154 lb 3.2 oz (69.945 kg)  03/23/13 159 lb 6.4 oz (72.303 kg)         Assessment & Plan:  Diarrhea  Postprandial abdominal bloating  Loss of weight - mild  1. Most likely post-infectious IBS, does not sound like a pancreatic insufficiency problems based upon current information 2. Check stool studies for Cx, WBC, Oand P and C diff 3. Dicyclomine 20 mg ac 4. Probiotic - suggest Align qd 5. RTC  2 months  I appreciate the opportunity to care for this patient. CC: Dorian Heckle, MD

## 2013-09-03 ENCOUNTER — Other Ambulatory Visit: Payer: Self-pay

## 2013-09-03 DIAGNOSIS — E875 Hyperkalemia: Secondary | ICD-10-CM

## 2013-09-03 NOTE — Progress Notes (Signed)
Quick Note:  Labs ok except mild elevated K level Should repeat that by next week  Speaks Spanish FYI ______

## 2013-09-08 ENCOUNTER — Other Ambulatory Visit: Payer: Medicare Other

## 2013-09-08 DIAGNOSIS — R197 Diarrhea, unspecified: Secondary | ICD-10-CM | POA: Diagnosis not present

## 2013-09-09 LAB — CLOSTRIDIUM DIFFICILE BY PCR: Toxigenic C. Difficile by PCR: NOT DETECTED

## 2013-09-09 LAB — FECAL LACTOFERRIN, QUANT: Lactoferrin: NEGATIVE

## 2013-09-10 NOTE — Progress Notes (Signed)
Quick Note:  Stool studies all negative  Let him know please ______

## 2013-09-12 LAB — STOOL CULTURE

## 2013-10-07 DIAGNOSIS — E11311 Type 2 diabetes mellitus with unspecified diabetic retinopathy with macular edema: Secondary | ICD-10-CM | POA: Diagnosis not present

## 2013-10-07 DIAGNOSIS — E1165 Type 2 diabetes mellitus with hyperglycemia: Secondary | ICD-10-CM | POA: Diagnosis not present

## 2013-10-07 DIAGNOSIS — H3582 Retinal ischemia: Secondary | ICD-10-CM | POA: Diagnosis not present

## 2013-10-07 DIAGNOSIS — E1139 Type 2 diabetes mellitus with other diabetic ophthalmic complication: Secondary | ICD-10-CM | POA: Diagnosis not present

## 2013-10-07 DIAGNOSIS — E11349 Type 2 diabetes mellitus with severe nonproliferative diabetic retinopathy without macular edema: Secondary | ICD-10-CM | POA: Diagnosis not present

## 2013-10-19 ENCOUNTER — Encounter: Payer: Self-pay | Admitting: Internal Medicine

## 2013-10-19 ENCOUNTER — Ambulatory Visit (INDEPENDENT_AMBULATORY_CARE_PROVIDER_SITE_OTHER): Payer: Medicare Other | Admitting: Internal Medicine

## 2013-10-19 VITALS — BP 154/78 | HR 69 | Temp 97.6°F | Ht 63.5 in | Wt 150.7 lb

## 2013-10-19 DIAGNOSIS — E119 Type 2 diabetes mellitus without complications: Secondary | ICD-10-CM | POA: Diagnosis not present

## 2013-10-19 DIAGNOSIS — Z8673 Personal history of transient ischemic attack (TIA), and cerebral infarction without residual deficits: Secondary | ICD-10-CM | POA: Diagnosis not present

## 2013-10-19 DIAGNOSIS — I10 Essential (primary) hypertension: Secondary | ICD-10-CM | POA: Diagnosis not present

## 2013-10-19 DIAGNOSIS — R194 Change in bowel habit: Secondary | ICD-10-CM

## 2013-10-19 DIAGNOSIS — E785 Hyperlipidemia, unspecified: Secondary | ICD-10-CM | POA: Diagnosis not present

## 2013-10-19 DIAGNOSIS — R198 Other specified symptoms and signs involving the digestive system and abdomen: Secondary | ICD-10-CM | POA: Diagnosis not present

## 2013-10-19 LAB — POCT GLYCOSYLATED HEMOGLOBIN (HGB A1C): HEMOGLOBIN A1C: 7.5

## 2013-10-19 LAB — GLUCOSE, CAPILLARY: GLUCOSE-CAPILLARY: 234 mg/dL — AB (ref 70–99)

## 2013-10-19 MED ORDER — CLOPIDOGREL BISULFATE 75 MG PO TABS: ORAL_TABLET | ORAL | Status: DC

## 2013-10-19 MED ORDER — LISINOPRIL 10 MG PO TABS: 5.0000 mg | ORAL_TABLET | Freq: Every day | ORAL | Status: DC

## 2013-10-19 NOTE — Patient Instructions (Addendum)
General Instructions:  1. Please schedule a follow up appointment for 6-8 WEEKS.  2. Please take all medications as prescribed. Continue current medications for now, if changes need to be made based on labs, will call you.  PLEASE SCHEDULE VISIT w/ DONNA PLYLER AT NEXT APPOINTMENT.   3. If you have worsening of your symptoms or new symptoms arise, please call the clinic [phone]), or go to the ER immediately if symptoms are severe.  You have done a great job in taking all your medications. I appreciate it very much. Please continue doing that.   Thank you for bringing your medicines today. This helps [place] keep you safe from mistakes.   Progress Toward Treatment Goals:  Treatment Goal 10/19/2013  Hemoglobin A1C at goal  Blood pressure at goal    Self Care Goals & Plans:  Self Care Goal 10/19/2013  Manage my medications take my medicines as prescribed; bring my medications to every visit; refill my medications on time  Monitor my health keep track of my blood glucose  Eat healthy foods drink diet soda or water instead of juice or soda; eat more vegetables; eat foods that are low in salt  Be physically active find an activity I enjoy; take a walk every day  Meeting treatment goals maintain the current self-care plan    Home Blood Glucose Monitoring 10/19/2013  Check my blood sugar 2 times a day  When to check my blood sugar before breakfast; before dinner     Care Management & Community Referrals:  Referral 10/19/2013  Referrals made for care management support none needed  Referrals made to community resources none

## 2013-10-19 NOTE — Assessment & Plan Note (Addendum)
BP Readings from Last 3 Encounters:  10/19/13 154/78  09/02/13 162/84  07/20/13 144/83    Lab Results  Component Value Date   NA 142 09/02/2013   K 5.5* 09/02/2013   CREATININE 1.2 09/02/2013    Assessment: Blood pressure control: controlled Progress toward BP goal:  at goal Comments: BP mildly elevated. Currently taking Lisinopril 5 mg po qd. BMP from 09/02/13 showed K of 5.5.  Plan: Medications:  continue current medications Educational resources provided:   Self management tools provided:   Other plans: Repeat BMP today. If K elevated, stop Lisinopril, consider starting HCTZ 12.5 bid instead.   ADDENDUM 10/20/13: Patient w/ K of 5.5 on repeat BMP. Will discontinue Lisinopril for now, start HCTZ 12.5 mg qd and have patient return to clinic in 2 weeks for follow up w/ BP.

## 2013-10-19 NOTE — Progress Notes (Signed)
Subjective:   Patient ID: Dustin Burns male   DOB: 09-Nov-1950 63 y.o.   MRN: [id number]  HPI: [name] is a 63 y.o. male w/ PMHx of HTN, HLD, GERD, DM type II, ED, and IBS, presents to the clinic today for a follow-up visit. Patient last seen by PCP, Dr. Michail Burns, on 07/20/13. Patient w/ frequent complaints of abdominal discomfort and chronic constipation. Referred to Dr. 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 Stool studies for Salmonella, E. Coli, C. Diff, etc. all found to be negative. Started on Bentyl 30 mg tid, but patient claims this gave his diarrhea. Today, he claims he is feeling quite well, says he is not having abdominal discomfort lately, no diarrhea, unless he drinks coffee or tea, which he says he has not been doing lately. He says he has lost 10 pounds over the past year, but says his weight has stabilized since he stopped having diarrhea and abdominal discomfort.  [name] says his DM control has been okay as well recently. According to his home CBG monitoring, his average is 160's, but says that he will have some highs and some lows depending on what he eats (or doesn't eat). He says his sugars are high if he eats rice or orange juice, and says he will feel tremulous and lightheaded in the middle of the day if he doesn't eat. We discussed the need for careful nutrition intake at length.  Additionally, patient had a recent BMP on 09/02/13 that showed hyperkalemia of 5.5. Currently, patient is on Lisinopril 5 mg po qd and it seems this may have not been increased 2/2 borderline high K in the past. Today, BP is slightly elevated to 154/78.  Past Medical History  Diagnosis Date  . Dyslipidemia   . Type 2 diabetes mellitus   . Meralgia paresthetica   . Diabetic peripheral neuropathy   . Hypertension   . Acute gastritis     hx of  . Lumbar back pain   . Tubular adenoma of rectum 06/20/2010   Current Outpatient Prescriptions  Medication  Sig Dispense Refill  . clopidogrel (PLAVIX) 75 MG tablet TAKE ONE TABLET BY MOUTH EVERY DAY  30 tablet  6  . dicyclomine (BENTYL) 20 MG tablet Take 1 tablet (20 mg total) by mouth 3 (three) times daily before meals.  90 tablet  2  . glucose blood (ONETOUCH VERIO) test strip usar 3 tiempos del dia antes de comida dx code 250.00 insulin requiring  100 each  5  . hydrocortisone cream 1 % APPLY TO AFFECTED AREA(S) TWICE DAILY  15 g  0  . Insulin Glargine 100 UNIT/ML SOPN Inject 15 Units into the skin at bedtime.  3 mL  6  . Insulin Pen Needle (RELION MINI PEN NEEDLES) 31G X 6 MM MISC Use to inject Lantus insulin once daily  100 each  6  . lisinopril (PRINIVIL,ZESTRIL) 10 MG tablet Take 0.5 tablets (5 mg total) by mouth daily.  30 tablet  3  . metFORMIN (GLUCOPHAGE) 1000 MG tablet Take 1 tablet (1,000 mg total) by mouth 2 (two) times daily with a meal.  180 tablet  4  . ONETOUCH DELICA LANCETS FINE MISC 1 each by Does not apply route 3 (three) times daily before meals.  99 each  6  . pravastatin (PRAVACHOL) 40 MG tablet Take 1 tablet (40 mg total) by mouth daily.  90 tablet  3  . zolpidem (AMBIEN) 5 MG tablet Take  1 tablet (5 mg total) by mouth at bedtime as needed for sleep.  30 tablet  2   No current facility-administered medications for this visit.   Family History  Problem Relation Age of Onset  . Diabetes Father   . Diabetes Brother    History   Social History  . Marital Status: Single    Spouse Name: N/A    Number of Children: 5  . Years of Education: N/A   Occupational History  . Unemployed    Social History Main Topics  . Smoking status: Former Smoker    Quit date: 07/25/2006  . Smokeless tobacco: Never Used  . Alcohol Use: No  . Drug Use: No  . Sexual Activity: None   Other Topics Concern  . None   Social History Narrative    Patient used to work for [employer], denies any alcohol smoking or illicit drug use.    Review of Systems: General: Denies fever,  chills, diaphoresis, appetite change and fatigue.  Respiratory: Denies SOB, DOE, cough, chest tightness, and wheezing.   Cardiovascular: Denies chest pain and palpitations.  Gastrointestinal: Denies nausea, vomiting, abdominal pain, diarrhea, constipation, blood in stool and abdominal distention.  Genitourinary: Denies dysuria, urgency, frequency, hematuria, and flank pain. Endocrine: Denies hot or cold intolerance, polyuria, and polydipsia. Musculoskeletal: Denies myalgias, back pain, joint swelling, arthralgias and gait problem.  Skin: Denies pallor, rash and wounds.  Neurological: Positive for infrequent lightheadedness. Denies dizziness, seizures, syncope, weakness, numbness and headaches.  Psychiatric/Behavioral: Denies mood changes, confusion, nervousness, sleep disturbance and agitation.  Objective:   Physical Exam: Filed Vitals:   10/19/13 1320  BP: 154/78  Pulse: 69  Temp: 97.6 F (36.4 C)  TempSrc: Oral  Height: 5' 3.5" (1.613 m)  Weight: 150 lb 11.2 oz (68.357 kg)  SpO2: 99%   General: Vital signs reviewed.  Patient is a spanish-speaking male, in no acute distress and cooperative with exam.  Head: Normocephalic and atraumatic. Eyes: PERRL, EOMI, conjunctivae normal, No scleral icterus.  Neck: Supple, trachea midline, normal ROM, No JVD, masses, thyromegaly, or carotid bruit present.  Cardiovascular: RRR, S1 normal, S2 normal, no murmurs, gallops, or rubs. Pulmonary/Chest: Air entry equal bilaterally, no wheezes, rales, or rhonchi. Abdominal: Soft, non-tender, non-distended, BS +, no masses, organomegaly, or guarding present.  Musculoskeletal: No joint deformities, erythema, or stiffness, ROM full and nontender. Extremities: No swelling or edema,  pulses symmetric and intact bilaterally. No cyanosis or clubbing. Neurological: A&O x3, Strength is normal and symmetric bilaterally, cranial nerve II-XII are grossly intact, no focal motor deficit, sensory intact to light touch  bilaterally.  Skin: Warm, dry and intact. No rashes or erythema. Psychiatric: Normal mood and affect. Speech and behavior is normal. Cognition and memory are normal.   Assessment & Plan:   Please see problem based assessment and plan.

## 2013-10-19 NOTE — Progress Notes (Signed)
Interpreter [name] for Dr Ronnald Ramp

## 2013-10-19 NOTE — Assessment & Plan Note (Signed)
Lab Results  Component Value Date   HGBA1C 7.5 10/19/2013   HGBA1C 7.4 07/20/2013   HGBA1C 6.5 03/23/2013     Assessment: Diabetes control: good control (HgbA1C at goal) Progress toward A1C goal:  at goal Comments: Patient w/ average CBG of 160's at home. Claims he has some highs and lows however. During last clinic visit w/ Dr. Michail Sermon, changed to Lantus 12 in AM, 8 in PM. Patient claims he was having some low CBG's in the PM w/ this, decreased Lantus to 11 in AM + 7 in PM himself. Says he has had better control since then. Discussed nutrition at length.   Plan: Medications:  continue current medications; Lantus 11 in AM + 7 in PM. Metformin 1000 mg bid.  Home glucose monitoring: Frequency: 2 times a day Timing: before breakfast;before dinner Instruction/counseling given: discussed diet Educational resources provided:   Self management tools provided: copy of home glucose meter download Other plans: Refer to [employer]  during next visit for further nutrition management. Repeat BMP today.

## 2013-10-19 NOTE — Assessment & Plan Note (Signed)
Patient w/ h/o abdominal discomfort and change in bowel habits. Recently, he claims he has had very normal bowel movements and has not had any recent discomfort. Patient saw Dr. Carlean Purl on 4//22/15 who started patient on Bentyl, but he claims this gave him diarrhea. He stopped taking this close to 1 month ago.  -No further management at this time given no complaints

## 2013-10-19 NOTE — Assessment & Plan Note (Signed)
Refilled Plavix today. ?

## 2013-10-19 NOTE — Assessment & Plan Note (Signed)
Check lipid panel today. Patient currently on Pravachol 40 mg qhs

## 2013-10-20 ENCOUNTER — Other Ambulatory Visit: Payer: Self-pay | Admitting: Internal Medicine

## 2013-10-20 ENCOUNTER — Telehealth: Payer: Self-pay | Admitting: Internal Medicine

## 2013-10-20 LAB — BASIC METABOLIC PANEL WITH GFR
BUN: 29 mg/dL — AB (ref 6–23)
CALCIUM: 9.7 mg/dL (ref 8.4–10.5)
CO2: 24 mEq/L (ref 19–32)
Chloride: 100 mEq/L (ref 96–112)
Creat: 1.48 mg/dL — ABNORMAL HIGH (ref 0.50–1.35)
GFR, Est African American: 58 mL/min — ABNORMAL LOW
GFR, Est Non African American: 50 mL/min — ABNORMAL LOW
Glucose, Bld: 251 mg/dL — ABNORMAL HIGH (ref 70–99)
Potassium: 5.5 mEq/L — ABNORMAL HIGH (ref 3.5–5.3)
Sodium: 135 mEq/L (ref 135–145)

## 2013-10-20 LAB — LIPID PANEL
CHOLESTEROL: 162 mg/dL (ref 0–200)
HDL: 40 mg/dL (ref >39)
LDL Cholesterol: 88 mg/dL (ref 0–99)
Total CHOL/HDL Ratio: 4.1 Ratio
Triglycerides: 171 mg/dL — ABNORMAL HIGH (ref <150)
VLDL: 34 mg/dL (ref 0–40)

## 2013-10-20 MED ORDER — HYDROCHLOROTHIAZIDE 12.5 MG PO CAPS: 12.5000 mg | ORAL_CAPSULE | Freq: Every day | ORAL | Status: DC

## 2013-10-20 NOTE — Telephone Encounter (Signed)
Spoke w/ Mr. Spears over the phone w/ the help of [employer] interpreter. Told patient to discontinue Lisinopril 5 mg po qd for now and start taking HCTZ 12.5 mg po qd instead for HTN control. Patient to return to clinic on 11/04/13 for BP check and also to meet w/ [facility]. Patient understood.   Signed: Corky Sox, MD 10/20/2013 8:58 AM

## 2013-10-20 NOTE — Addendum Note (Signed)
Addended byCorky Sox on: 10/20/2013 08:56 AM   Modules accepted: Orders, Medications

## 2013-10-20 NOTE — Progress Notes (Signed)
Case discussed with Dr. Jones at the time of the visit.  We reviewed the resident's history and exam and pertinent patient test results.  I agree with the assessment, diagnosis, and plan of care documented in the resident's note. 

## 2013-11-04 ENCOUNTER — Encounter: Payer: Self-pay | Admitting: Dietician

## 2013-11-04 ENCOUNTER — Encounter: Payer: Self-pay | Admitting: Internal Medicine

## 2013-11-04 ENCOUNTER — Ambulatory Visit (INDEPENDENT_AMBULATORY_CARE_PROVIDER_SITE_OTHER): Payer: Medicare Other | Admitting: Internal Medicine

## 2013-11-04 ENCOUNTER — Ambulatory Visit (INDEPENDENT_AMBULATORY_CARE_PROVIDER_SITE_OTHER): Payer: Medicare Other | Admitting: Dietician

## 2013-11-04 VITALS — BP 144/78 | HR 78 | Temp 97.5°F | Ht 63.5 in | Wt 151.1 lb

## 2013-11-04 DIAGNOSIS — E119 Type 2 diabetes mellitus without complications: Secondary | ICD-10-CM | POA: Diagnosis not present

## 2013-11-04 DIAGNOSIS — I10 Essential (primary) hypertension: Secondary | ICD-10-CM | POA: Diagnosis not present

## 2013-11-04 LAB — BASIC METABOLIC PANEL WITH GFR
BUN: 36 mg/dL — AB (ref 6–23)
CO2: 22 meq/L (ref 19–32)
CREATININE: 1.63 mg/dL — AB (ref 0.50–1.35)
Calcium: 9.7 mg/dL (ref 8.4–10.5)
Chloride: 106 mEq/L (ref 96–112)
GFR, Est African American: 52 mL/min — ABNORMAL LOW
GFR, Est Non African American: 45 mL/min — ABNORMAL LOW
GLUCOSE: 94 mg/dL (ref 70–99)
Potassium: 5.6 mEq/L — ABNORMAL HIGH (ref 3.5–5.3)
Sodium: 137 mEq/L (ref 135–145)

## 2013-11-04 MED ORDER — INSULIN GLARGINE 100 UNIT/ML SOLOSTAR PEN: PEN_INJECTOR | SUBCUTANEOUS | Status: DC

## 2013-11-04 MED ORDER — GLUCOSE BLOOD VI STRP: ORAL_STRIP | Status: DC

## 2013-11-04 NOTE — Progress Notes (Signed)
Interpreter [name] for Dr Eyvonne Mechanic

## 2013-11-04 NOTE — Assessment & Plan Note (Signed)
With an A1C of 7.5 Patient is apparently using Lantus twice daily (11 units in AM, 7 units in PM) although according to our records in [facility] he should be taking 15 units qhs.  Plans: Previous office visits documented twice daily instructions and patient is currently taking twice daily, therefore I will change the instructions to what he is taking currently. Follow up in 2 months for a repeat A1C.

## 2013-11-04 NOTE — Patient Instructions (Signed)
Continue all the medications as advised.

## 2013-11-04 NOTE — Progress Notes (Signed)
Subjective:   Patient ID: Dustin Burns male   DOB: 1950/06/01 63 y.o.   MRN: [id number]  HPI: Dustin Burns is a 63 y.o. with HTN, DM-II, HLD, H/O TIA comes to the office for a follow up of his previous visit.  Patient does not speak english as spanish is his main language and the history is obtained with the help of interpreter.   Patient was recommended to stop taking Lisinopril as he was found to have hyperkalemia on BMP at the office visit on 10/19/13. Patient was started on HCTZ 12.5 mg po qd and was recommended a 2 week follow up. Patient brings all his medication bottle and reports compliancy to all the medications.  Patient bring his glucometer to office today and review of his meter shows values to be less than 200 with fasting values in 120-140's range.  Patient denies any complaints during this office visit.    Past Medical History  Diagnosis Date  . Dyslipidemia   . Type 2 diabetes mellitus   . Meralgia paresthetica   . Diabetic peripheral neuropathy   . Hypertension   . Acute gastritis     hx of  . Lumbar back pain   . Tubular adenoma of rectum 06/20/2010   Current Outpatient Prescriptions  Medication Sig Dispense Refill  . clopidogrel (PLAVIX) 75 MG tablet TAKE ONE TABLET BY MOUTH EVERY DAY  30 tablet  6  . glucose blood (ONETOUCH VERIO) test strip usar 3 tiempos del dia antes de comida dx code 250.00 insulin requiring  100 each  5  . hydrochlorothiazide (MICROZIDE) 12.5 MG capsule Take 1 capsule (12.5 mg total) by mouth daily.  30 capsule  2  . hydrocortisone cream 1 % APPLY TO AFFECTED AREA(S) TWICE DAILY  15 g  0  . Insulin Glargine (LANTUS) 100 UNIT/ML Solostar Pen Take 11 units in the morning and 7 units in evening  3 mL  6  . Insulin Pen Needle (RELION MINI PEN NEEDLES) 31G X 6 MM MISC Use to inject Lantus insulin once daily  100 each  6  . metFORMIN (GLUCOPHAGE) 1000 MG tablet Take 1 tablet (1,000 mg total) by mouth 2 (two) times daily with a meal.  180  tablet  4  . ONETOUCH DELICA LANCETS FINE MISC 1 each by Does not apply route 3 (three) times daily before meals.  99 each  6  . pravastatin (PRAVACHOL) 40 MG tablet Take 1 tablet (40 mg total) by mouth daily.  90 tablet  3  . zolpidem (AMBIEN) 5 MG tablet Take 1 tablet (5 mg total) by mouth at bedtime as needed for sleep.  30 tablet  2   No current facility-administered medications for this visit.   Family History  Problem Relation Age of Onset  . Diabetes Father   . Diabetes Brother    History   Social History  . Marital Status: Single    Spouse Name: N/A    Number of Children: 5  . Years of Education: N/A   Occupational History  . Unemployed    Social History Main Topics  . Smoking status: Former Smoker    Quit date: 07/25/2006  . Smokeless tobacco: Never Used  . Alcohol Use: No  . Drug Use: No  . Sexual Activity: None   Other Topics Concern  . None   Social History Narrative    Patient used to work for [employer], denies any alcohol smoking or illicit drug use.   Review of  Systems: Pertinent items are noted in HPI. Objective:  Physical Exam: Filed Vitals:   11/04/13 1021  BP: 144/78  Pulse: 78  Temp: 97.5 F (36.4 C)  TempSrc: Oral  Height: 5' 3.5" (1.613 m)  Weight: 151 lb 1.6 oz (68.539 kg)  SpO2: 99%   Constitutional: Vital signs reviewed.   Patient is a well-developed and well-nourished and is in no acute distress and cooperative with exam.  Neck: No carotid bruit present.  Cardiovascular: RRR, S1 normal, S2 normal, no MRG Pulmonary/Chest: normal respiratory effort, CTAB, no wheezes, rales, or rhonchi Extremities: No pedal edema. Neurological: A&O x3  Assessment & Plan:

## 2013-11-04 NOTE — Assessment & Plan Note (Signed)
Repeat manual BP in 142/78. Recently switched from ACE-I monotherapy (Lisinopril 5 mg qd) to HCTZ 12.5 mg po qd, secondary to hyperkalemia and worsening renal failure.  Plans: Continue current regimen for now. Check BMP to monitor renal function and Potassium. Follow up in 2 months.

## 2013-11-04 NOTE — Progress Notes (Signed)
Medical Nutrition Therapy:  Appt start time: 1030 end time:  1100. Last visit > 3 years ago Assessment:  Primary concerns today: Blood sugar control.  Patient seen with interpreter today/ He was referred to assist with nutrition for blood sugar control. He agrees his Pm readings could be better and are likely affected by his food choices. . A1C 7.5%, weight is within normal limits for his height and age. BMI 26.  Usual eating pattern includes 3 meals and 0-2 snacks per day. Frequent foods include beans, milk, meat, tortillas.  Limited foods include alcohol, fruit-eats several times a week, rice- eats ~ 1x/week.   24-hr recall: B (8-9 AM)-  ~ 8 oz milk, Chicken, beans and 2 small homemade tortillas, water- before this meal CBg 90 and after 115 ( rise of 25 mg/dl is very reasonable for ~ 30-40 grams carb) L (11-2 PM)- chicken, beans Snk ( PM)- crackers  D (4-7 PM)- sometime rice ~ 1 cup, tortillas, beans, chicken, fish, beef   Physical activity: he says blood sugar comes down when he  "works" ?  around house, he also says he is not currently working SMBG:Meter download: average is 165, range is 78-316, mostly in target in am, and mostly > 150 in PM with some 130 and 140s., is using his meal markers on occassion Medications- takes lantus 11 units ~7am-9am, 7 units 6 PM   Progress Towards Goal(s):  In progress.   Nutritional Diagnosis:  NI-5.8.4 Inconsistent carbohydrate intake As related to reported intake of variable intake of food with signifiacnt amounts of carbs.  As evidenced by hsi fluctuating readings in the PM. .    Intervention:  Nutrition education on options to lower carbs in snacks and meals. Patient goal for next 1-2 weeks is to try keeping meal times more consistent to see if this helps him to eat smaller portions.  Monitoring/Evaluation:  Dietary intake, exercise, meter, and body weight in 2 week(s).

## 2013-11-04 NOTE — Patient Instructions (Signed)
Por favor make an appointment with Butch Penny for 2 weeks.

## 2013-11-06 ENCOUNTER — Telehealth: Payer: Self-pay | Admitting: *Deleted

## 2013-11-06 NOTE — Telephone Encounter (Signed)
Attempted to contact pt via phone with the help of [place] [phone] option #1). No answer, message left on recorder (in spanish) to have pt return call to clinic and to return to clinic on Monday 11/09/13 with all his medication bottles.   Pt was given a 10:45am appt w/ DrBoggala, but may arrive earlier to have labs drawn.  If pt returns call today, he will need to be informed of abnormal lab results and reminded of appt scheduled for  11/09/2013.Despina Hidden Cassady6/26/20152:55 PM

## 2013-11-07 NOTE — Progress Notes (Signed)
Case discussed with Dr. Boggala at the time of the visit.  We reviewed the resident's history and exam and pertinent patient test results.  I agree with the assessment, diagnosis, and plan of care documented in the resident's note. 

## 2013-11-09 ENCOUNTER — Ambulatory Visit: Payer: Medicare Other | Admitting: Internal Medicine

## 2013-11-09 ENCOUNTER — Encounter: Payer: Self-pay | Admitting: *Deleted

## 2013-11-12 ENCOUNTER — Telehealth: Payer: Self-pay | Admitting: Internal Medicine

## 2013-11-12 DIAGNOSIS — I10 Essential (primary) hypertension: Secondary | ICD-10-CM

## 2013-11-12 NOTE — Telephone Encounter (Signed)
Spoke to the patient through the interpreter.  Told him that his potassium is slightly high. Recommended to take HCTZ 12.5 mg BID for until 11/16/13. Patient is to come to the clinic on Monday 11/16/13 for repeat BMP. Message is conveyed to the patient clearly and he understood the instructions.

## 2013-11-16 ENCOUNTER — Ambulatory Visit (INDEPENDENT_AMBULATORY_CARE_PROVIDER_SITE_OTHER): Payer: Medicare Other | Admitting: Internal Medicine

## 2013-11-16 VITALS — BP 149/87 | HR 61 | Temp 97.2°F

## 2013-11-16 DIAGNOSIS — L259 Unspecified contact dermatitis, unspecified cause: Secondary | ICD-10-CM | POA: Diagnosis not present

## 2013-11-16 DIAGNOSIS — E785 Hyperlipidemia, unspecified: Secondary | ICD-10-CM | POA: Diagnosis not present

## 2013-11-16 DIAGNOSIS — I10 Essential (primary) hypertension: Secondary | ICD-10-CM | POA: Diagnosis not present

## 2013-11-16 DIAGNOSIS — E119 Type 2 diabetes mellitus without complications: Secondary | ICD-10-CM | POA: Diagnosis not present

## 2013-11-16 LAB — BASIC METABOLIC PANEL WITH GFR
BUN: 32 mg/dL — AB (ref 6–23)
CALCIUM: 9.4 mg/dL (ref 8.4–10.5)
CO2: 25 meq/L (ref 19–32)
CREATININE: 1.42 mg/dL — AB (ref 0.50–1.35)
Chloride: 104 mEq/L (ref 96–112)
GFR, Est African American: 61 mL/min
GFR, Est Non African American: 53 mL/min — ABNORMAL LOW
GLUCOSE: 109 mg/dL — AB (ref 70–99)
Potassium: 4.6 mEq/L (ref 3.5–5.3)
Sodium: 141 mEq/L (ref 135–145)

## 2013-11-16 NOTE — Assessment & Plan Note (Signed)
Hyperkalemia resolved upon increasing the HCTZ to 12.5 mg BID for a few days. Creatinine also improving gradually. Recommended to take HCTZ 12.5 MG po qd. Follow up in a month. Instructions given through interpreter over the phone.

## 2013-11-16 NOTE — Progress Notes (Signed)
Patient came here for the lab work up. Office visit note opened by mistake at the front desk.

## 2013-11-18 ENCOUNTER — Encounter: Payer: Self-pay | Admitting: Dietician

## 2013-11-18 ENCOUNTER — Ambulatory Visit (INDEPENDENT_AMBULATORY_CARE_PROVIDER_SITE_OTHER): Payer: Medicare Other | Admitting: Dietician

## 2013-11-18 VITALS — Wt 151.4 lb

## 2013-11-18 DIAGNOSIS — E1149 Type 2 diabetes mellitus with other diabetic neurological complication: Secondary | ICD-10-CM | POA: Diagnosis not present

## 2013-11-18 NOTE — Patient Instructions (Signed)
Hasta el 3 de Augusto at 2:30 PM.  Muy Bien trabajando -  Comer mas de estos    ensalda tomatos avacados verduras- apio, zanahoria juevos nueces

## 2013-11-18 NOTE — Progress Notes (Signed)
Medical Nutrition Therapy:  Appt start time: 1025 end time:  1100. Last visit: 11-04-13 Assessment:  Primary concerns today: Blood sugar control.  Patient seen with interpreter, 2 nursing students and significant other.  He feels he did well at eating at more consistent times and eating smaller amounts of foods that increase blood sugar most of the time over the past 2 weeks and that his Om readings re still too high. He feels his medication is appropriate and that he can work more on decreasing high starch and sugar foods. Weight is stable and appropriate.Activity or specific 24 hours recall not discussed today Usual eating pattern includes 3 meals and 0-2 snacks per day. Frequent foods include beans, milk, meat, tortillas.  Limited foods include alcohol, fruit-eats several times a week, rice- eats ~ 1x/week.   SMBG:Meter download: average since change with 54% of readings in target. Average was 174 mg/dl before eating changes with only 35% in target. Medications- no change  Progress Towards Goal(s):  In progress.   Nutritional Diagnosis:  NI-5.8.4 Inconsistent carbohydrate intake As related to reported intake of variable intake of food with signifiacnt amounts of carbs.  As evidenced by hsi fluctuating readings in the PM. .    Intervention:  Nutrition re-education/reveiw on options to lower carbs in snacks and meals. Patient goal for next 4 weeks is to try eat more vegetables and foods that do not increase blood sugar and smaller portion in the afternoon and evening.Coordination of care: discuss with physician evening medication change if patient cannot achieve more than 70% in target range with diet changes only to consider adding prandin 0.5 mg with PM meal.  Monitoring/Evaluation:  Dietary intake, exercise, meter, and body weight in 4 week(s).

## 2013-12-14 ENCOUNTER — Ambulatory Visit (INDEPENDENT_AMBULATORY_CARE_PROVIDER_SITE_OTHER): Payer: Medicare Other | Admitting: Dietician

## 2013-12-14 ENCOUNTER — Ambulatory Visit (INDEPENDENT_AMBULATORY_CARE_PROVIDER_SITE_OTHER): Payer: Medicare Other | Admitting: Internal Medicine

## 2013-12-14 ENCOUNTER — Ambulatory Visit (HOSPITAL_COMMUNITY)
Admission: RE | Admit: 2013-12-14 | Discharge: 2013-12-14 | Disposition: A | Discharge status: Home / Self Care | Payer: Medicare Other | Source: Ambulatory Visit | Attending: Internal Medicine | Admitting: Internal Medicine

## 2013-12-14 VITALS — Wt 151.6 lb

## 2013-12-14 VITALS — BP 158/86 | HR 75 | Temp 97.5°F | Ht 63.5 in | Wt 151.6 lb

## 2013-12-14 DIAGNOSIS — I10 Essential (primary) hypertension: Secondary | ICD-10-CM

## 2013-12-14 DIAGNOSIS — E119 Type 2 diabetes mellitus without complications: Secondary | ICD-10-CM

## 2013-12-14 DIAGNOSIS — L259 Unspecified contact dermatitis, unspecified cause: Secondary | ICD-10-CM | POA: Diagnosis not present

## 2013-12-14 DIAGNOSIS — E1149 Type 2 diabetes mellitus with other diabetic neurological complication: Secondary | ICD-10-CM | POA: Diagnosis not present

## 2013-12-14 DIAGNOSIS — E785 Hyperlipidemia, unspecified: Secondary | ICD-10-CM | POA: Diagnosis not present

## 2013-12-14 MED ORDER — HYDROCHLOROTHIAZIDE 25 MG PO TABS: 25.0000 mg | ORAL_TABLET | Freq: Every day | ORAL | Status: DC

## 2013-12-14 NOTE — Progress Notes (Signed)
   Subjective:    Patient ID: Dustin Burns, male    DOB: Aug 29, 1950, 63 y.o.   MRN: [id number]  HPI  Please refer to problem based charting for more details.  Review of Systems  Constitutional: Negative for fever and chills.  Respiratory: Negative for cough, chest tightness, shortness of breath and wheezing.   Cardiovascular: Negative for chest pain.  Gastrointestinal: Negative for abdominal distention.  Genitourinary: Negative for dysuria, hematuria and difficulty urinating.       Objective:   Physical Exam  General: In no acute distress, wearing sunglasses indoors HEENT: PERRLA, EOMI, no pharyngeal erythema or exudates Cardiovascular: Regular rate rhythm, no murmurs rubs or gallops Respiratory: Clear to auscultation bilaterally, no wheezes or rales Extremities: No pitting edema Neuro: Cranial nerves II through XII grossly intact, strength and sensation grossly intact in upper and lower extremities.      Assessment & Plan:   Please refer to problem based charting.

## 2013-12-14 NOTE — Assessment & Plan Note (Signed)
Unclear the status of blood pressure management since patient has not been measuring blood pressures at home. In clinic, pressure elevated at 158/86.  - Increase hydrochlorothiazide from 12 mg daily to 25 mg daily.

## 2013-12-14 NOTE — Progress Notes (Signed)
Medical Nutrition Therapy:  Appt start time: 1435 end time:  1500 Last visit: 11-18-13 is the 3rd of 4 visits.  CBG was 113  3 hours after eating chicken, diet soda and 1 tortilla Assessment:  Primary concerns today: Blood sugar control.  Patient seen with interpreter.  He feels he did well at eating at more consistent times and eating smaller amounts of foods that increase blood sugar, however, his blood sugar still goes up at times. Weight is stable and appropriate.  Usual eating pattern includes 3 meals and 0-2 snacks per day. Frequent foods include beans, milk, meat, tortillas.  Limited foods include alcohol, fruit-eats several times a week, rice- eats ~ 1x/week.  Patient likely already eating a low carbohydrate diet most of the time and expreiences high blood sugar when he does eats more carbs. SMBG:  forgot meter, reports last night ~ 6 PM it was 151, he ate bowl of soup and was awakended in th emiddle of the night with symptoms of low blood sugar. He reports that this is the firs time this happened in the past several months.  Denies alcoholic beverage or increased activty yesterday.   Medications- no change- TDD is 18 units/day estimated basal is ~ 17 units/day, A1C was 7.5%, to add prandial insulin would recommend decreasing basal the same amount especially in light of last night nocturnal hypoglycemia. Anticipate he will need prandial insulin soon  Progress Towards Goal(s):  In progress.   Nutritional Diagnosis:  NI-5.8.4 Inconsistent carbohydrate intake As related to reported intake of variable intake of food with significant amounts of carbs imroving  As evidenced by his report.    Intervention:  Nutrition re-education/reveiw on options to lower carbs in snacks and meals. Likely needs medication adjustment improve blood sugars.  Monitoring/Evaluation:  Dietary intake, exercise, meter, and body weight in 1 months(s).

## 2013-12-14 NOTE — Assessment & Plan Note (Addendum)
Status of glycemic control unclear since patient left glucometer at home. Patient reports that blood glucose levels in the morning tend to run between 100 and 130, and in the evenings, 150-180. Patient does report some symptoms of weakness in bilateral upper extremities after exertion, associated with diaphoresis but not chest pain or shortness of breath. Likely due to mild hypoglycemia, but will rule out cardiac pathology with with EKG. He reports that these symptoms have been going on for many years.  - Patient to continue on current regimen of 11 units of Lantus in the morning and 7 units of Lantus in the evening.  - Patient to return to clinic in one month to followup glucometer readings, and followup hemoglobin A1c.  - Urine microalbumin given switch away from ACE inhibitor monotherapy.  - EKG was normal sinus rhythm, no evidence of axis deviation, no evidence of current or prior ischemia  Addendum 12/17/13: Urine microalbumin/creatinine marked elevated at 628, s/p discontinuation of ACE inhibitor for hyperkalemia. Will further manage at next appointment.

## 2013-12-14 NOTE — Patient Instructions (Signed)
por favor haga una visita de seguimiento en 1 mes.  Por favor traiga su metro prxima vez que nos veamos. Gracias!

## 2013-12-14 NOTE — Patient Instructions (Addendum)
Please increase your hydrochlorothiazide dosage from 12.5 mg daily to 25 mg daily. Please return to clinic in one month.

## 2013-12-15 LAB — MICROALBUMIN / CREATININE URINE RATIO
CREATININE, URINE: 103.1 mg/dL
Microalb Creat Ratio: 628.8 mg/g — ABNORMAL HIGH (ref 0.0–30.0)
Microalb, Ur: 64.83 mg/dL — ABNORMAL HIGH (ref 0.00–1.89)

## 2013-12-17 NOTE — Progress Notes (Signed)
I saw and evaluated the patient.  I personally confirmed the key portions of the history and exam documented by Dr. Raelene Bott and I reviewed pertinent patient test results.  The assessment, diagnosis, and plan were formulated together and I agree with the documentation in the resident's note.

## 2013-12-17 NOTE — Addendum Note (Signed)
Addended by: Gilles Chiquito B on: 12/17/2013 02:53 PM   Modules accepted: Level of Service

## 2014-01-05 ENCOUNTER — Other Ambulatory Visit: Payer: Self-pay | Admitting: *Deleted

## 2014-01-05 DIAGNOSIS — E119 Type 2 diabetes mellitus without complications: Secondary | ICD-10-CM

## 2014-01-05 MED ORDER — METFORMIN HCL 1000 MG PO TABS: 1000.0000 mg | ORAL_TABLET | Freq: Two times a day (BID) | ORAL | Status: DC

## 2014-01-27 ENCOUNTER — Ambulatory Visit (INDEPENDENT_AMBULATORY_CARE_PROVIDER_SITE_OTHER): Payer: Medicare Other | Admitting: Internal Medicine

## 2014-01-27 ENCOUNTER — Encounter: Payer: Self-pay | Admitting: Internal Medicine

## 2014-01-27 ENCOUNTER — Ambulatory Visit: Payer: Medicare Other | Admitting: Internal Medicine

## 2014-01-27 VITALS — BP 159/83 | HR 75 | Temp 98.0°F | Ht 64.0 in | Wt 153.9 lb

## 2014-01-27 DIAGNOSIS — I10 Essential (primary) hypertension: Secondary | ICD-10-CM | POA: Diagnosis not present

## 2014-01-27 DIAGNOSIS — E119 Type 2 diabetes mellitus without complications: Secondary | ICD-10-CM

## 2014-01-27 DIAGNOSIS — Z23 Encounter for immunization: Secondary | ICD-10-CM | POA: Diagnosis not present

## 2014-01-27 LAB — POCT GLYCOSYLATED HEMOGLOBIN (HGB A1C): HEMOGLOBIN A1C: 7.8

## 2014-01-27 LAB — GLUCOSE, CAPILLARY: GLUCOSE-CAPILLARY: 168 mg/dL — AB (ref 70–99)

## 2014-01-27 MED ORDER — AMLODIPINE BESYLATE 5 MG PO TABS: 5.0000 mg | ORAL_TABLET | Freq: Every day | ORAL | Status: DC

## 2014-01-27 NOTE — Assessment & Plan Note (Signed)
Received the flu vaccine during this visit.

## 2014-01-27 NOTE — Assessment & Plan Note (Signed)
BP Readings from Last 3 Encounters:  01/27/14 159/83  12/14/13 158/86  11/16/13 149/87    Lab Results  Component Value Date   NA 141 11/16/2013   K 4.6 11/16/2013   CREATININE 1.42* 11/16/2013    Assessment: Blood pressure control:  Not controlled Progress toward BP goal:   Not at goal Comments: He is on HCTZ 25mg  daily. He has elevated urine mircoalbumin but ACEi stopped due to hyperkalemia. Start CCB which is 2nd line for proteinuria tx  Plan: Medications:  continue current medications and start amlodipine 5mg  daily Educational resources provided: brochure;handout;video Self management tools provided: home blood pressure logbook Other plans: Follow up in 1 week for BP recheck. He may need amlodipine 10mg  daily--will need Rx for amlodipine 10mg  daily.

## 2014-01-27 NOTE — Progress Notes (Signed)
   Subjective:    Patient ID: Dustin Burns, male    DOB: 1951-03-16, 63 y.o.   MRN: [id number]  HPI Dustin Burns is a 63 year old Spanish speaking man, originally from [place] but resident of [place] and [place] for the past 30 years, with PMH of DM2, and hypertension, presenting for follow up visit.  He brought his home BP log sheet with him with BP elevated to 180s/100s most afternoons. He denies dizziness, CP, SOB, or headaches.  He continues to use Lantus twice daily and had only one hypoglycemic event this past month at [facility] because he skipped supper the night before. He brought his CBG meter with him today and has before breakfast reads in the 100-140 range and after meal reads in the 140-200 range.    Review of Systems  Constitutional: Negative for fever, chills, diaphoresis, activity change, appetite change, fatigue and unexpected weight change.  Respiratory: Negative for cough, shortness of breath and wheezing.   Cardiovascular: Negative for chest pain, palpitations and leg swelling.  Gastrointestinal: Negative for nausea, vomiting, abdominal pain and constipation.  Genitourinary: Negative for dysuria.  Musculoskeletal: Negative for back pain.  Neurological: Positive for weakness. Negative for dizziness, light-headedness and headaches.  Psychiatric/Behavioral: Negative for agitation.       Objective:   Physical Exam  Nursing note and vitals reviewed. Constitutional: He is oriented to person, place, and time. He appears well-developed and well-nourished. No distress.  Cardiovascular: Normal rate and regular rhythm.   Pulmonary/Chest: Effort normal and breath sounds normal. No respiratory distress. He has no wheezes. He has no rales.  Abdominal: Soft.  Musculoskeletal: He exhibits no edema and no tenderness.  Neurological: He is alert and oriented to person, place, and time. Coordination normal.  Skin: Skin is warm and dry. No rash noted. He is not diaphoretic. No  erythema. No pallor.  Psychiatric: He has a normal mood and affect. His behavior is normal.          Assessment & Plan:

## 2014-01-27 NOTE — Assessment & Plan Note (Addendum)
Lab Results  Component Value Date   HGBA1C 7.8 01/27/2014   HGBA1C 7.5 10/19/2013   HGBA1C 7.4 07/20/2013     Assessment: Diabetes control:  Not controlled Progress toward A1C goal:   Not at goal Comments: He is on metformin 1000mg  BID and Lantus 11 units in am and 7 units at night. He had one episode of hypoglycemia last month due to skipping a meal. He states that he has eaten more breads and pastries this past month and noted that his BS increased. He will try to avoid pastries for now on.   Plan: Medications:  continue current medications with dietary modification Home glucose monitoring: Frequency:  3 times daily Timing:  before breakfast and after meals Instruction/counseling given: reminded to bring medications to each visit and discussed diet Educational resources provided: brochure;handout Self management tools provided: copy of home glucose meter download Other plans: Follow up in 1 week. Needs diabetic foot exam.

## 2014-01-27 NOTE — Patient Instructions (Signed)
-  Start taking amlodipine 5mg  daily for your blood pressure.  -Continue checking your blood pressure at home.  -Follow up in 1 week.    Thank you for bringing your medicines today. This helps [place] keep you safe from mistakes.  -Empieze a tomar 5 mg de amlodipino diaria para la presin arterial. -Continuar medindo de su presin arterial en casa. -Haga una sita de seguimiento para 1 semana.   Gracias por traer sus medicamentos hoy. Esto nos ayuda a mantener a salvo de errores.

## 2014-01-28 NOTE — Progress Notes (Signed)
Medicine attending: Pertinent history, presenting problems, evaluation and management reviewed with resident physician Dr. Hayes Ludwig and I concur with her evaluation and plan. Murriel Hopper, M.D., [place]

## 2014-02-09 ENCOUNTER — Ambulatory Visit (INDEPENDENT_AMBULATORY_CARE_PROVIDER_SITE_OTHER): Payer: Medicare Other | Admitting: Internal Medicine

## 2014-02-09 ENCOUNTER — Encounter: Payer: Self-pay | Admitting: Internal Medicine

## 2014-02-09 VITALS — BP 154/73 | HR 80 | Temp 98.0°F | Ht 64.0 in | Wt 153.6 lb

## 2014-02-09 DIAGNOSIS — E739 Lactose intolerance, unspecified: Secondary | ICD-10-CM | POA: Diagnosis not present

## 2014-02-09 DIAGNOSIS — E785 Hyperlipidemia, unspecified: Secondary | ICD-10-CM | POA: Diagnosis not present

## 2014-02-09 DIAGNOSIS — R198 Other specified symptoms and signs involving the digestive system and abdomen: Secondary | ICD-10-CM | POA: Diagnosis not present

## 2014-02-09 DIAGNOSIS — K219 Gastro-esophageal reflux disease without esophagitis: Secondary | ICD-10-CM | POA: Diagnosis not present

## 2014-02-09 DIAGNOSIS — K5900 Constipation, unspecified: Secondary | ICD-10-CM | POA: Diagnosis not present

## 2014-02-09 DIAGNOSIS — Z8673 Personal history of transient ischemic attack (TIA), and cerebral infarction without residual deficits: Secondary | ICD-10-CM | POA: Diagnosis not present

## 2014-02-09 DIAGNOSIS — E119 Type 2 diabetes mellitus without complications: Secondary | ICD-10-CM | POA: Diagnosis not present

## 2014-02-09 DIAGNOSIS — F528 Other sexual dysfunction not due to a substance or known physiological condition: Secondary | ICD-10-CM | POA: Diagnosis not present

## 2014-02-09 DIAGNOSIS — I10 Essential (primary) hypertension: Secondary | ICD-10-CM | POA: Diagnosis not present

## 2014-02-09 LAB — GLUCOSE, CAPILLARY: GLUCOSE-CAPILLARY: 138 mg/dL — AB (ref 70–99)

## 2014-02-09 MED ORDER — AMLODIPINE BESYLATE 5 MG PO TABS: 10.0000 mg | ORAL_TABLET | Freq: Every day | ORAL | Status: DC

## 2014-02-09 MED ORDER — AMLODIPINE BESYLATE 10 MG PO TABS: 10.0000 mg | ORAL_TABLET | Freq: Every day | ORAL | Status: DC

## 2014-02-09 NOTE — Progress Notes (Signed)
   Subjective:    Patient ID: Dustin Burns, male    DOB: [birth year], 63 y.o.   MRN: [id number]  HPI  [name] is a 62 year old gentleman with a history of hypertension, diabetes, dyslipidemia, TIA who presents for followup for management of blood pressure.  Please refer to separate problem-list charting for more details.  Review of Systems  Constitutional: Negative for fever and chills.  HENT: Negative for sore throat.   Respiratory: Negative for cough and shortness of breath.   Cardiovascular: Negative for chest pain.  Gastrointestinal: Negative for nausea, vomiting, abdominal pain, diarrhea, constipation and anal bleeding.  Genitourinary: Negative for dysuria and hematuria.  Neurological: Positive for headaches ( now resolved). Negative for syncope.      Objective:   Physical Exam  Constitutional: He is oriented to person, place, and time. He appears well-developed and well-nourished. No distress.  HENT:  Head: Normocephalic and atraumatic.  Eyes: EOM are normal. Pupils are equal, round, and reactive to light.  Neck: Normal range of motion. Neck supple. No thyromegaly present.  Cardiovascular: Normal rate and regular rhythm.  Exam reveals no gallop and no friction rub.   No murmur heard. Pulmonary/Chest: Effort normal and breath sounds normal. No respiratory distress. He has no wheezes. He has no rales.  Abdominal: Soft. Bowel sounds are normal. He exhibits no distension. There is no tenderness. There is no rebound.  Musculoskeletal: Normal range of motion. He exhibits no edema.  Neurological: He is alert and oriented to person, place, and time.  Skin: Skin is warm and dry. No erythema.   Filed Vitals:   02/09/14 1039  BP: 154/73  Pulse: 80  Temp: 98 F (36.7 C)       Assessment & Plan:  Please refer to separate problem-list charting for more details.

## 2014-02-09 NOTE — Assessment & Plan Note (Addendum)
Lab Results  Component Value Date   HGBA1C 7.8 01/27/2014   HGBA1C 7.5 10/19/2013   HGBA1C 7.4 07/20/2013     Assessment: Diabetes control: fair control Progress toward A1C goal:  unchanged Comments: Home blood glucose monitoring only remarkable for one episode of hypoglycemia at 69. Otherwise, patient has been mostly in the 100s.  Plan: Medications:  continue current medications Instruction/counseling given: reminded to get eye exam, reminded to bring blood glucose meter & log to each visit, reminded to bring medications to each visit and discussed foot care

## 2014-02-09 NOTE — Assessment & Plan Note (Signed)
BP Readings from Last 3 Encounters:  02/09/14 154/73  01/27/14 159/83  12/14/13 158/86    Lab Results  Component Value Date   NA 141 11/16/2013   K 4.6 11/16/2013   CREATININE 1.42* 11/16/2013    Assessment: Blood pressure control: mildly elevated Progress toward BP goal:  unchanged Comments: Patient currently on amlodipine 5 mg and hydrochlorothiazide 25 mg. Patient denies any episodes of lightheadedness or hypotension.   Plan: Medications: Increasing amlodipine from 5 mg to 10 mg daily. Keep hydrochlorothiazide 25 mg daily. We'll followup with patient regarding blood pressure control in 2 weeks.

## 2014-02-09 NOTE — Progress Notes (Signed)
INTERNAL MEDICINE TEACHING ATTENDING ADDENDUM - Aldine Contes, MD: I personally saw and evaluated Dustin Burns in this clinic visit in conjunction with the resident, Dr. Raelene Bott. I have discussed patient's plan of care with medical resident during this visit. I have confirmed the physical exam findings and have read and agree with the clinic note including the plan with the following addition: - Pt also with proteinuria. Unable to tolerate ACE-I secondary to hyperkalemia - Will monitor. May need to be started on diltiazem or verapamil for proteinuria

## 2014-02-09 NOTE — Patient Instructions (Addendum)
We have increased your blood pressure medication, amlodipine, from 5 mg to 10 mg. Please continue taking your blood pressures at home. Please also try to eat healthy and avoid sweets to keep your sugars under control.  General Instructions:   Thank you for bringing your medicines today. This helps [place] keep you safe from mistakes.   Progress Toward Treatment Goals:  Treatment Goal 02/09/2014  Hemoglobin A1C unchanged  Blood pressure unchanged    Self Care Goals & Plans:  Self Care Goal 02/09/2014  Manage my medications take my medicines as prescribed; bring my medications to every visit; refill my medications on time  Monitor my health -  Eat healthy foods drink diet soda or water instead of juice or soda; eat more vegetables; eat foods that are low in salt; eat baked foods instead of fried foods; eat fruit for snacks and desserts  Be physically active -  Meeting treatment goals -    Home Blood Glucose Monitoring 10/19/2013  Check my blood sugar 2 times a day  When to check my blood sugar before breakfast; before dinner     Care Management & Community Referrals:  Referral 10/19/2013  Referrals made for care management support none needed  Referrals made to community resources none

## 2014-02-23 ENCOUNTER — Encounter: Payer: Self-pay | Admitting: Internal Medicine

## 2014-02-23 ENCOUNTER — Ambulatory Visit (INDEPENDENT_AMBULATORY_CARE_PROVIDER_SITE_OTHER): Payer: Medicare Other | Admitting: Internal Medicine

## 2014-02-23 VITALS — BP 129/66 | HR 83 | Temp 98.0°F | Ht 64.0 in | Wt 152.3 lb

## 2014-02-23 DIAGNOSIS — E1165 Type 2 diabetes mellitus with hyperglycemia: Secondary | ICD-10-CM | POA: Diagnosis not present

## 2014-02-23 DIAGNOSIS — J069 Acute upper respiratory infection, unspecified: Secondary | ICD-10-CM | POA: Insufficient documentation

## 2014-02-23 DIAGNOSIS — E1142 Type 2 diabetes mellitus with diabetic polyneuropathy: Secondary | ICD-10-CM

## 2014-02-23 DIAGNOSIS — I10 Essential (primary) hypertension: Secondary | ICD-10-CM | POA: Diagnosis not present

## 2014-02-23 DIAGNOSIS — IMO0002 Reserved for concepts with insufficient information to code with codable children: Secondary | ICD-10-CM

## 2014-02-23 LAB — GLUCOSE, CAPILLARY: GLUCOSE-CAPILLARY: 112 mg/dL — AB (ref 70–99)

## 2014-02-23 NOTE — Assessment & Plan Note (Signed)
Lab Results  Component Value Date   HGBA1C 7.8 01/27/2014   HGBA1C 7.5 10/19/2013   HGBA1C 7.4 07/20/2013     Assessment: Diabetes control: fair control Progress toward A1C goal:  unchanged Comments: Compliant with his medications, including one in 1000 mg twice a day and Lantus 11 units daily. Glucose meter revealed one episode of hypoglycemia of 67. Patient was asymptomatic. This was noted on his previous visit by his PCP and no changes were made on his medications.  Plan: Medications:  continue current medications Home glucose monitoring: Frequency: 3 times a day Timing: before breakfast Instruction/counseling given: no instruction/counseling  Educational resources provided: brochure Self management tools provided: copy of home glucose meter download;home glucose logbook Other plans: will follow up with PCP.

## 2014-02-23 NOTE — Assessment & Plan Note (Signed)
Assessment: Most likely diagnosis: Peripheral neuropathy in view of history of the same in his EMR.  Ddx: I do not suspect osteoarthritis but pain is poorly described.   Plan: 1. Labs/imaging: none 2. Therapy: Discussed trial of gabapentin as this is most likely peripheral neuropathy, but patient declined medication, and would like to see how the pain progresses before he starts a new medication. I recommended that he can try over-the-counter Tylenol, and followup if his pain worsen his. 3. Follow up: Follow up with PCP in 1-2 months.

## 2014-02-23 NOTE — Progress Notes (Signed)
Patient ID: Dustin Burns, male   DOB: 1950/10/29, 63 y.o.   MRN: [id number]   Subjective:   HPI: [name] is a 63 y.o. gentleman with a history of hypertension, diabetes, dyslipidemia, TIA who presents for followup.    Reason(s) for this visit: 1. Cough: Patient reports cough over the past several days without fevers, chills, or increased fatigue. It is productive of clear sputum, but denies wheezing, shortness of breath, chest pain. No hemoptysis. Reports some URI, like symptoms, including rhinorrhea, and sneezing. 2. Right leg pain: Patient reports right leg pain, which has been persistent for the last several months. He reports pain as a 7/10, but is poorly characterizing as we are using a Patent attorney. He endorses some numbness and possibly tingling. No history of trauma. No increased weakness. No recent falls. He also endorses similar symptoms in the left leg, but also complains of pain in his knees.  Kindly see the A&P for the status of the pt's chronic medical problems.  ROS: Constitutional: Denies fever, chills, diaphoresis, appetite change and fatigue.  CVS: No chest pain, palpitations and leg swelling.  GI: No abdominal pain, nausea, vomiting, bloody stools GU: No dysuria, frequency, hematuria, or flank pain.  MSK: No myalgias, back pain Psych: No depression symptoms. No SI or SA.    Objective:  Physical Exam: Filed Vitals:   02/23/14 1053  BP: 129/66  Pulse: 83  Temp: 98 F (36.7 C)  TempSrc: Oral  Height: 5\' 4"  (1.626 m)  Weight: 152 lb 4.8 oz (69.083 kg)  SpO2: 100%   General: Well nourished. No acute distress.  HEENT: Normal oral mucosa. MMM.  Lungs: CTA bilaterally. Heart: RRR; no extra sounds or murmurs  Abdomen: Non-distended, normal bowel sounds, soft, nontender; no hepatosplenomegaly  Extremities: No pedal edema. No joint swelling or tenderness. Careful examination of his feet does not reveal skin breakdowns. He has good sensations in both  lower extremities bilaterally. Neurologic: Normal EOM,  Alert and oriented x3. No obvious neurologic/cranial nerve deficits.  Assessment & Plan:  Discussed case with my attending in the clinic, Dr. Daryll Drown  See problem based charting.

## 2014-02-23 NOTE — Assessment & Plan Note (Addendum)
Most likely cause of his cough is a viral URI. No symptoms or signs of pneumonia. Recommended over-the-counter cough medications and Tylenol. Will followup if symptoms worsen.

## 2014-02-23 NOTE — Assessment & Plan Note (Signed)
BP Readings from Last 3 Encounters:  02/23/14 129/66  02/09/14 154/73  01/27/14 159/83    Lab Results  Component Value Date   NA 141 11/16/2013   K 4.6 11/16/2013   CREATININE 1.42* 11/16/2013    Assessment: Blood pressure control: controlled Progress toward BP goal:  at goal Comments: Well controlled on amlodipine 10 mg daily, and hydrochlorothiazide 25 mg daily. He is intolerant to ACE inhibitors due to hyperkalemia.  Plan: Medications:  continue current medications Educational resources provided: brochure Self management tools provided:   Other plans: Followup with PCP in 1-2 months.

## 2014-02-23 NOTE — Patient Instructions (Signed)
General Instructions: Please take your medications as before  Please follow up with Dr Raelene Bott in 1-2 months   Thank you for bringing your medicines today. This helps [place] keep you safe from mistakes.   Progress Toward Treatment Goals:  Treatment Goal 02/23/2014  Hemoglobin A1C unchanged  Blood pressure at goal    Self Care Goals & Plans:  Self Care Goal 02/23/2014  Manage my medications take my medicines as prescribed; bring my medications to every visit; refill my medications on time  Monitor my health keep track of my blood glucose; bring my glucose meter and log to each visit  Eat healthy foods drink diet soda or water instead of juice or soda; eat more vegetables; eat foods that are low in salt; eat baked foods instead of fried foods; eat fruit for snacks and desserts  Be physically active -  Meeting treatment goals -    Home Blood Glucose Monitoring 02/23/2014  Check my blood sugar 3 times a day  When to check my blood sugar before breakfast     Care Management & Community Referrals:  Referral 02/23/2014  Referrals made for care management support none needed  Referrals made to community resources -

## 2014-03-06 NOTE — Progress Notes (Signed)
Internal Medicine Clinic Attending  Case discussed with Dr. Kazibwe soon after the resident saw the patient.  We reviewed the resident's history and exam and pertinent patient test results.  I agree with the assessment, diagnosis, and plan of care documented in the resident's note. 

## 2014-03-07 DIAGNOSIS — I10 Essential (primary) hypertension: Secondary | ICD-10-CM | POA: Diagnosis not present

## 2014-03-07 DIAGNOSIS — R0602 Shortness of breath: Secondary | ICD-10-CM | POA: Diagnosis not present

## 2014-03-07 DIAGNOSIS — R079 Chest pain, unspecified: Secondary | ICD-10-CM | POA: Diagnosis not present

## 2014-03-07 DIAGNOSIS — E119 Type 2 diabetes mellitus without complications: Secondary | ICD-10-CM | POA: Diagnosis not present

## 2014-03-07 DIAGNOSIS — R0789 Other chest pain: Secondary | ICD-10-CM | POA: Diagnosis not present

## 2014-03-07 DIAGNOSIS — R05 Cough: Secondary | ICD-10-CM | POA: Diagnosis not present

## 2014-03-11 DIAGNOSIS — R0602 Shortness of breath: Secondary | ICD-10-CM | POA: Diagnosis not present

## 2014-03-31 DIAGNOSIS — E11351 Type 2 diabetes mellitus with proliferative diabetic retinopathy with macular edema: Secondary | ICD-10-CM | POA: Diagnosis not present

## 2014-04-15 ENCOUNTER — Encounter: Payer: Self-pay | Admitting: *Deleted

## 2014-04-16 DIAGNOSIS — E11351 Type 2 diabetes mellitus with proliferative diabetic retinopathy with macular edema: Secondary | ICD-10-CM | POA: Diagnosis not present

## 2014-04-26 ENCOUNTER — Encounter: Payer: Medicare Other | Admitting: Internal Medicine

## 2014-04-26 ENCOUNTER — Encounter: Payer: Self-pay | Admitting: Internal Medicine

## 2014-04-26 ENCOUNTER — Ambulatory Visit (INDEPENDENT_AMBULATORY_CARE_PROVIDER_SITE_OTHER): Payer: Medicare Other | Admitting: Internal Medicine

## 2014-04-26 VITALS — BP 121/69 | HR 79 | Temp 98.2°F | Wt 149.6 lb

## 2014-04-26 DIAGNOSIS — I10 Essential (primary) hypertension: Secondary | ICD-10-CM

## 2014-04-26 DIAGNOSIS — Z794 Long term (current) use of insulin: Secondary | ICD-10-CM | POA: Diagnosis not present

## 2014-04-26 DIAGNOSIS — IMO0002 Reserved for concepts with insufficient information to code with codable children: Secondary | ICD-10-CM

## 2014-04-26 DIAGNOSIS — Z8673 Personal history of transient ischemic attack (TIA), and cerebral infarction without residual deficits: Secondary | ICD-10-CM | POA: Diagnosis not present

## 2014-04-26 DIAGNOSIS — E119 Type 2 diabetes mellitus without complications: Secondary | ICD-10-CM

## 2014-04-26 DIAGNOSIS — E1165 Type 2 diabetes mellitus with hyperglycemia: Secondary | ICD-10-CM

## 2014-04-26 LAB — GLUCOSE, CAPILLARY: Glucose-Capillary: 120 mg/dL — ABNORMAL HIGH (ref 70–99)

## 2014-04-26 LAB — POCT GLYCOSYLATED HEMOGLOBIN (HGB A1C): Hemoglobin A1C: 6.9

## 2014-04-26 MED ORDER — ASPIRIN EC 81 MG PO TBEC: 81.0000 mg | DELAYED_RELEASE_TABLET | Freq: Every day | ORAL | Status: AC

## 2014-04-26 MED ORDER — AMLODIPINE BESYLATE 10 MG PO TABS: 10.0000 mg | ORAL_TABLET | Freq: Every day | ORAL | Status: DC

## 2014-04-26 NOTE — Patient Instructions (Signed)
General Instructions: Your Hg A1c is well controlled today. Good job! Your blood pressure is also well controlled today.   Follow up in 3 months.    Su diabetes est bien controlada Dustin Burns!  Su presin arterial tambin est bien controlada.   Haga una cita de seguimiento para tres meses.   Dustin Burns and [place]!         Thank you for bringing your medicines today. This helps [place] keep you safe from mistakes.   Progress Toward Treatment Goals:  Treatment Goal 04/26/2014  Hemoglobin A1C at goal  Blood pressure at goal    Self Care Goals & Plans:  Self Care Goal 04/26/2014  Manage my medications -  Monitor my health -  Eat healthy foods -  Be physically active -  Meeting treatment goals maintain the current self-care plan    Home Blood Glucose Monitoring 04/26/2014  Check my blood sugar 2 times a day  When to check my blood sugar before meals     Care Management & Community Referrals:  Referral 04/26/2014  Referrals made for care management support none needed  Referrals made to community resources -

## 2014-04-26 NOTE — Progress Notes (Signed)
Case discussed with Dr. Kennerly soon after the resident saw the patient.  We reviewed the resident's history and exam and pertinent patient test results.  I agree with the assessment, diagnosis, and plan of care documented in the resident's note. 

## 2014-04-26 NOTE — Assessment & Plan Note (Signed)
From OV note from 11/2010, the patient had had two occurences of left sided weakness (arm and leg) and was thought to have TIAs. Carotid Dopplers and MRI were ordered but were not done. He was started on Plavix as he had been on ASA prior to these episodes.  Today the patient explains that those two occasions of weakness were due to trauma to the leg and arm and denies TIA symptoms. He has stopped taking Plavix 6 weeks ago due to its presumed side-effects of fatigue and weakness. He is not interested in taking any other "blood thinners" at this time.   He was counseled on the potential risk of stroke if he stops Plavix and has had TIAs in the past but he is adamant that he never had these symptoms in the past and does not need Plavix or other "blood thinners".   He was advised to at least take ASA 81mg  daily and he agreed with this plan. He was advised to seek immediate medical help if he has weakness--he and his wife agreed with this plan.

## 2014-04-26 NOTE — Assessment & Plan Note (Signed)
BP Readings from Last 3 Encounters:  04/26/14 121/69  02/23/14 129/66  02/09/14 154/73    Lab Results  Component Value Date   NA 141 11/16/2013   K 4.6 11/16/2013   CREATININE 1.42* 11/16/2013    Assessment: Blood pressure control: controlled Progress toward BP goal:  at goal Comments: He is on HCTZ 25mg  daily, Norvasc 10mg  daily (increased form 5mg  daily during his last visit)  Plan: Medications:  continue current medications Educational resources provided:   Self management tools provided:   Other plans: Refilled Norvasc 10mg  daily. Follow up in 3 months.
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
## 2014-04-26 NOTE — Assessment & Plan Note (Signed)
Lab Results  Component Value Date   HGBA1C 6.9 04/26/2014   HGBA1C 7.8 01/27/2014   HGBA1C 7.5 10/19/2013     Assessment: Diabetes control: good control (HgbA1C at goal) Progress toward A1C goal:  at goal, improved Comments: He is on metformin 1000mg  BID ac, Lantus 11 units with bk and 7 units with dinner. CBG meter download reviewed with no hypoglycemia, before meal reads in the 110-130s.   Plan: Medications:  continue current medications Home glucose monitoring: Frequency: 2 times a day Timing: before meals Instruction/counseling given: reminded to bring blood glucose meter & log to each visit Educational resources provided:   Self management tools provided:   Other plans: Had eye exam on Dec 4th, will obtain records. Diabetic foot exam performed today.

## 2014-05-03 ENCOUNTER — Encounter: Payer: Medicare Other | Admitting: Internal Medicine

## 2014-05-11 DIAGNOSIS — H43811 Vitreous degeneration, right eye: Secondary | ICD-10-CM | POA: Diagnosis not present

## 2014-05-27 ENCOUNTER — Other Ambulatory Visit: Payer: Self-pay | Admitting: *Deleted

## 2014-05-27 DIAGNOSIS — E118 Type 2 diabetes mellitus with unspecified complications: Secondary | ICD-10-CM

## 2014-05-27 MED ORDER — INSULIN GLARGINE 100 UNIT/ML SOLOSTAR PEN: PEN_INJECTOR | SUBCUTANEOUS | Status: DC

## 2014-07-19 ENCOUNTER — Other Ambulatory Visit: Payer: Self-pay | Admitting: Internal Medicine

## 2014-07-26 ENCOUNTER — Other Ambulatory Visit: Payer: Self-pay | Admitting: *Deleted

## 2014-07-26 DIAGNOSIS — IMO0002 Reserved for concepts with insufficient information to code with codable children: Secondary | ICD-10-CM

## 2014-07-26 DIAGNOSIS — E1121 Type 2 diabetes mellitus with diabetic nephropathy: Secondary | ICD-10-CM

## 2014-07-26 DIAGNOSIS — E1165 Type 2 diabetes mellitus with hyperglycemia: Principal | ICD-10-CM

## 2014-07-27 MED ORDER — GLUCOSE BLOOD VI STRP
ORAL_STRIP | Status: DC
Start: 2014-07-27 — End: 2014-08-09

## 2014-08-05 ENCOUNTER — Telehealth: Payer: Self-pay | Admitting: Internal Medicine

## 2014-08-05 NOTE — Telephone Encounter (Signed)
Call to patient to confirm appointment for 08/09/14 at 1:45 lmtcb

## 2014-08-09 ENCOUNTER — Encounter: Payer: Self-pay | Admitting: Internal Medicine

## 2014-08-09 ENCOUNTER — Ambulatory Visit (INDEPENDENT_AMBULATORY_CARE_PROVIDER_SITE_OTHER): Payer: Medicare Other | Admitting: Internal Medicine

## 2014-08-09 VITALS — BP 134/73 | HR 77 | Temp 98.5°F | Wt 148.2 lb

## 2014-08-09 DIAGNOSIS — E119 Type 2 diabetes mellitus without complications: Secondary | ICD-10-CM

## 2014-08-09 DIAGNOSIS — J3089 Other allergic rhinitis: Secondary | ICD-10-CM | POA: Diagnosis not present

## 2014-08-09 DIAGNOSIS — I10 Essential (primary) hypertension: Secondary | ICD-10-CM

## 2014-08-09 LAB — POCT GLYCOSYLATED HEMOGLOBIN (HGB A1C): HEMOGLOBIN A1C: 8

## 2014-08-09 LAB — GLUCOSE, CAPILLARY: Glucose-Capillary: 345 mg/dL — ABNORMAL HIGH (ref 70–99)

## 2014-08-09 MED ORDER — INSULIN GLARGINE 100 UNIT/ML SOLOSTAR PEN
PEN_INJECTOR | SUBCUTANEOUS | Status: DC
Start: 2014-08-09 — End: 2015-01-10

## 2014-08-09 MED ORDER — METFORMIN HCL 1000 MG PO TABS: 1000.0000 mg | ORAL_TABLET | Freq: Two times a day (BID) | ORAL | Status: DC

## 2014-08-09 MED ORDER — GLUCOSE BLOOD VI STRP
ORAL_STRIP | Status: DC
Start: 2014-08-09 — End: 2014-08-11

## 2014-08-09 MED ORDER — AMLODIPINE BESYLATE 10 MG PO TABS: 10.0000 mg | ORAL_TABLET | Freq: Every day | ORAL | Status: DC

## 2014-08-09 MED ORDER — LORATADINE 10 MG PO TABS: 10.0000 mg | ORAL_TABLET | Freq: Every day | ORAL | Status: DC

## 2014-08-09 NOTE — Assessment & Plan Note (Addendum)
Lab Results  Component Value Date   HGBA1C 8.0 08/09/2014   HGBA1C 6.9 04/26/2014   HGBA1C 7.8 01/27/2014     Assessment: Diabetes control: fair control Progress toward A1C goal:  deteriorated Comments: He is on metformin 1000mg  BID ac, Lantus 11 uni in am and 7 units in pm. Has had increase in blood sugar for the pat two weeks due to intake of OTC cough syrup with sugar. Has increased Lantus to 12 un in am and 8 uni pm with improvement of glycemic control  Plan: Medications:  increase Lantus to 12 uni in am and 8 uni in pm Home glucose monitoring: Frequency: 2 times a day Timing:   Instruction/counseling given: reminded to bring blood glucose meter & log to each visit and discussed diet Educational resources provided:   Self management tools provided:   Other plans: Follow up in 2 months. Will need repeat urine microalbumin/Cr at that time as well as repeat BMET with GFR for indication of continued use of metformin. Had appointment made for eye exam.

## 2014-08-09 NOTE — Assessment & Plan Note (Signed)
His nasal congestion, post-nasal drip are suggestive of allergic rhinitis. He has a dry cough worse at night which could also be related to seasonal allergies (no fever/chills to suspect pneumonia at this time).  -Pt will start taking Claritin 10mg  daily.

## 2014-08-09 NOTE — Progress Notes (Signed)
   Subjective:    Patient ID: Dustin Burns, male    DOB: April 06, 1951, 64 y.o.   MRN: [id number]  HPI Dustin Burns is a 64 yr old Spanish speaking man with PMH of HTN, DM2, who presents, accompanied by his wife for evaluation of URI symptoms,diabetes, nd hypertension care.  He has maintained his weight since his last visit but his HgbA1c has risen from 6.9% in Dec to 8.0% today. He tells me that he had fever 2 weeks ago that resolved but he has sneezing, runny nose with postnasal drip, and nasal congestion that is worse at night. He continues to check his blood sugar twice daily and tells me that for the lat two weeks they have been running high because he has been taking a cold cough syrup that contains sugar.  He has a dry cough at night, denies SOB, pleuritic chest pain.   Review of Systems  Constitutional: Negative for fever, chills, activity change, appetite change and fatigue.  HENT: Positive for congestion, postnasal drip, rhinorrhea and sneezing. Negative for ear pain, nosebleeds, sinus pressure, sore throat and voice change.   Respiratory: Positive for cough. Negative for shortness of breath and wheezing.   Cardiovascular: Negative for chest pain and leg swelling.  Gastrointestinal: Negative for abdominal pain.  Genitourinary: Negative for dysuria.  Skin: Negative for rash.  Neurological: Negative for dizziness and light-headedness.  Psychiatric/Behavioral: Negative for agitation.       Objective:   Physical Exam  Constitutional: He is oriented to person, place, and time. He appears well-developed and well-nourished. No distress.  HENT:  Mouth/Throat: Oropharynx is clear and moist. No oropharyngeal exudate.  Pale nasal turbinates bilaterally with no nasal discharge    Eyes: Conjunctivae are normal.  Cardiovascular: Normal rate.   Pulmonary/Chest: Effort normal. No respiratory distress. He has no wheezes.  Faint crackles at left lower base  Abdominal: Soft.  Musculoskeletal:  He exhibits no edema.  Neurological: He is alert and oriented to person, place, and time. Coordination normal.  Skin: Skin is warm and dry. He is not diaphoretic.  Psychiatric: He has a normal mood and affect.  Nursing note and vitals reviewed.         Assessment & Plan:

## 2014-08-09 NOTE — Patient Instructions (Addendum)
General Instructions: -Start using Lantus 12 units in the morning and 8 units at night.  -Start taking Claritin 10mg  daily for your nasal congestion.  -Avoid taking medications with sugar. Ask the Pharmacist for medications without sugar.  -Follow up with [place] in 2 months or sooner as needed.   -Iniciar el uso de Lantus 12 unidades por la maana y 8 unidades en la noche  -Tomar Claritin 10 mg al da para su congestin nasal .  -Evite tomar medicamentos con azcar. Pregunta te al farmacutico por medicamentos sin azcar. -Haga una cita de seguimiento para 2 meses o antes si lo sea necessario.     Thank you for bringing your medicines today. This helps [place] keep you safe from mistakes.   Progress Toward Treatment Goals:  Treatment Goal 08/09/2014  Hemoglobin A1C deteriorated  Blood pressure at goal    Self Care Goals & Plans:  Self Care Goal 08/09/2014  Manage my medications bring my medications to every visit  Monitor my health -  Eat healthy foods -  Be physically active -  Meeting treatment goals maintain the current self-care plan    Home Blood Glucose Monitoring 08/09/2014  Check my blood sugar 2 times a day  When to check my blood sugar -     Care Management & Community Referrals:  Referral 04/26/2014  Referrals made for care management support none needed  Referrals made to community resources -     Rinitis alrgica (Allergic Rhinitis) La rinitis alrgica ocurre cuando las membranas mucosas de la nariz responden a los alrgenos. Los alrgenos son las partculas que estn en el aire y que hacen que el cuerpo tenga una reaccin IT consultant. Esto hace que usted libere anticuerpos alrgicos. A travs de una cadena de eventos, estos finalmente hacen que usted libere histamina en la corriente sangunea. Aunque la funcin de la histamina es proteger al organismo, es esta liberacin de histamina lo que provoca malestar, como los estornudos frecuentes, la congestin y goteo y  Engineer, petroleum.  CAUSAS  La causa de la rinitis Regulatory affairs officer (fiebre del heno) son los alrgenos del polen que pueden provenir del csped, los rboles y Human resources officer. La causa de la rinitis Stage manager (rinitis alrgica perenne) son los alrgenos como los caros del polvo domstico, la caspa de las mascotas y las esporas del moho.  SNTOMAS   Secrecin nasal (congestin).  Goteo y picazn nasales con estornudos y Industrial/product designer. DIAGNSTICO  Su mdico puede ayudarlo a Actor alrgeno o los alrgenos que desencadenan sus sntomas. Si usted y su mdico no pueden Teacher, adult education cul es el alrgeno, pueden hacerse anlisis de sangre o estudios de la piel. TRATAMIENTO  La rinitis alrgica no tiene [place], pero puede controlarse mediante lo siguiente:  Medicamentos y vacunas contra la alergia (inmunoterapia).  Prevencin del alrgeno. La fiebre del heno a menudo puede tratarse con antihistamnicos en las formas de pldoras o aerosol nasal. Los antihistamnicos bloquean los efectos de la histamina. Existen medicamentos de venta libre que pueden ayudar con la congestin nasal y la hinchazn alrededor de los ojos. Consulte a su mdico antes de tomar o administrarse este medicamento.  Si la prevencin del alrgeno o el medicamento recetado no dan resultado, existen muchos medicamentos nuevos que su mdico puede recetarle. Pueden usarse medicamentos ms fuertes si las medidas iniciales no son efectivas. Pueden aplicarse inyecciones desensibilizantes si los medicamentos y la prevencin no funcionan. La desensibilizacin ocurre cuando un paciente recibe vacunas constantes hasta que el cuerpo se vuelve  menos sensible al alrgeno. Asegrese de Chartered certified accountant seguimiento con su mdico si los problemas continan. INSTRUCCIONES PARA EL CUIDADO EN EL HOGAR No es posible evitar por completo los alrgenos, pero puede reducir los sntomas al tomar medidas para limitar su exposicin a ellos. Es muy til saber  exactamente a qu es alrgico para que pueda evitar sus desencadenantes especficos. SOLICITE ATENCIN MDICA SI:   Jaclynn Guarneri.  Desarrolla una tos que no se detiene fcilmente (persistente).  Le falta el aire.  Comienza a tener sibilancias.  Los sntomas interfieren con las actividades diarias normales. Document Released: 02/07/2005 Document Revised: 02/18/2013 [facility] Patient Information 2015 [place]. This information is not intended to replace advice given to you by your health care provider. Make sure you discuss any questions you have with your health care provider.

## 2014-08-09 NOTE — Assessment & Plan Note (Signed)
BP Readings from Last 3 Encounters:  08/09/14 134/73  04/26/14 121/69  02/23/14 129/66    Lab Results  Component Value Date   NA 141 11/16/2013   K 4.6 11/16/2013   CREATININE 1.42* 11/16/2013    Assessment: Blood pressure control: controlled Progress toward BP goal:  at goal Comments: He is on Norvasc 10 mg daily, HCTZ 25mg  daily. He may need to be started on ACEi if he has persistently elevated urine micro/Cr   Plan: Medications:  continue current medications Educational resources provided:   Self management tools provided:   Other plans: Follow up in 2 months.

## 2014-08-10 NOTE — Progress Notes (Signed)
Internal Medicine Clinic Attending Date of visit: 08/09/2014  Case discussed with Dr. Hayes Ludwig soon after the resident saw the patient on the day of the visit .  We reviewed the resident's history and exam and pertinent patient test results.  I agree with the assessment, diagnosis, and plan of care documented in the resident's note.

## 2014-08-11 ENCOUNTER — Other Ambulatory Visit: Payer: Self-pay | Admitting: *Deleted

## 2014-08-11 DIAGNOSIS — E119 Type 2 diabetes mellitus without complications: Secondary | ICD-10-CM

## 2014-08-11 MED ORDER — GLUCOSE BLOOD VI STRP: ORAL_STRIP | Status: DC

## 2014-08-11 NOTE — Telephone Encounter (Signed)
Received faxed notice from pt's pharmacy that pt's insurance will not accept prescribing MD on rx for test strips.   The pharmacy has asked that a new rx be sent with a different MD.  Will send rx in with pt's pcp [facility]) name on rx.Dustin Burns, Dustin Mousseau Cassady3/30/201611:23 AM

## 2014-08-18 ENCOUNTER — Other Ambulatory Visit: Payer: Self-pay | Admitting: *Deleted

## 2014-08-18 DIAGNOSIS — E119 Type 2 diabetes mellitus without complications: Secondary | ICD-10-CM

## 2014-08-18 MED ORDER — GLUCOSE BLOOD VI STRP: ORAL_STRIP | Status: DC

## 2014-08-18 NOTE — Telephone Encounter (Signed)
Please resend wrong diag code sent

## 2014-09-21 ENCOUNTER — Encounter: Payer: Self-pay | Admitting: *Deleted

## 2014-10-08 ENCOUNTER — Ambulatory Visit (INDEPENDENT_AMBULATORY_CARE_PROVIDER_SITE_OTHER): Payer: Medicare Other | Admitting: Internal Medicine

## 2014-10-08 ENCOUNTER — Encounter: Payer: Self-pay | Admitting: Internal Medicine

## 2014-10-08 VITALS — BP 132/72 | HR 78 | Temp 98.1°F | Ht 64.0 in | Wt 152.0 lb

## 2014-10-08 DIAGNOSIS — E119 Type 2 diabetes mellitus without complications: Secondary | ICD-10-CM

## 2014-10-08 DIAGNOSIS — E1129 Type 2 diabetes mellitus with other diabetic kidney complication: Secondary | ICD-10-CM

## 2014-10-08 DIAGNOSIS — Z79899 Other long term (current) drug therapy: Secondary | ICD-10-CM

## 2014-10-08 DIAGNOSIS — E11351 Type 2 diabetes mellitus with proliferative diabetic retinopathy with macular edema: Secondary | ICD-10-CM | POA: Diagnosis not present

## 2014-10-08 DIAGNOSIS — H3582 Retinal ischemia: Secondary | ICD-10-CM | POA: Diagnosis not present

## 2014-10-08 DIAGNOSIS — Z794 Long term (current) use of insulin: Secondary | ICD-10-CM | POA: Diagnosis not present

## 2014-10-08 DIAGNOSIS — I10 Essential (primary) hypertension: Secondary | ICD-10-CM

## 2014-10-08 DIAGNOSIS — H43811 Vitreous degeneration, right eye: Secondary | ICD-10-CM | POA: Diagnosis not present

## 2014-10-08 LAB — GLUCOSE, CAPILLARY: Glucose-Capillary: 138 mg/dL — ABNORMAL HIGH (ref 65–99)

## 2014-10-08 NOTE — Patient Instructions (Signed)
General Instructions:   Thank you for bringing your medicines today. This helps [place] keep you safe from mistakes.   Continue what you are doing for your diabetes and blood pressure. We will see you in 3 months.    Progress Toward Treatment Goals:  Treatment Goal 08/09/2014  Hemoglobin A1C deteriorated  Blood pressure at goal    Self Care Goals & Plans:  Self Care Goal 10/08/2014  Manage my medications take my medicines as prescribed; bring my medications to every visit; refill my medications on time  Monitor my health -  Eat healthy foods drink diet soda or water instead of juice or soda; eat more vegetables; eat foods that are low in salt; eat baked foods instead of fried foods; eat fruit for snacks and desserts  Be physically active -  Meeting treatment goals -    Home Blood Glucose Monitoring 08/09/2014  Check my blood sugar 2 times a day  When to check my blood sugar -     Care Management & Community Referrals:  Referral 04/26/2014  Referrals made for care management support none needed  Referrals made to community resources -

## 2014-10-08 NOTE — Assessment & Plan Note (Signed)
Lab Results  Component Value Date   HGBA1C 8.0 08/09/2014   HGBA1C 6.9 04/26/2014   HGBA1C 7.8 01/27/2014     Assessment: Diabetes control:   Progress toward A1C goal:    Comments: per review of pt CBG meter has had much better and great control (2 occassions pt ate a cake and sugar was in 250-300 range) but has been working on his dietary indiscretions.   Plan: Medications:  continue current medications of lantus 12 units qhs and 8 units at lunch and metformin 1000mg  BID Home glucose monitoring: Frequency:   Timing:   Instruction/counseling given: discussed diet Educational resources provided: brochure (denies) Self management tools provided: copy of home glucose meter download, home glucose logbook Other plans: pt f/u in 1 month concern for microalbuminuria and no ACEi, pt had eye exam today, pt refused other testing today will do at f/u

## 2014-10-08 NOTE — Progress Notes (Signed)
Subjective:   Patient ID: Dustin Burns male   DOB: 1951/03/23 64 y.o.   MRN: [id number]  HPI: Dustin Burns is a 64 y.o. Spanish speaking man pmh as listed below presents for HTN and DM follow up.  Hypertension ROS: taking medications as instructed, no medication side effects noted, no TIA's, no chest pain on exertion, no dyspnea on exertion and no swelling of ankles.  New concerns: pt has not been taking his Plavix as he states it makes him feel swollen and has not had any dysarthria, dysphagia, arm or leg weakness. He has continued to take the ASA.   DM - Patient checking blood sugars 2 times daily, before breakfast and dinner and brings in his CBG meter for review. Currently taking lantus 12 units in AM 8 units at lunch and metformin 1000mg  BID. No hypoglycemic episodes since last visit. denies polyuria, polydipsia, nausea, vomiting, diarrhea.  does not request refills today.  Past Medical History  Diagnosis Date  . Dyslipidemia   . Type 2 diabetes mellitus   . Meralgia paresthetica   . Diabetic peripheral neuropathy   . Hypertension   . Acute gastritis     hx of  . Lumbar back pain   . Tubular adenoma of rectum 06/20/2010   Current Outpatient Prescriptions  Medication Sig Dispense Refill  . amLODipine (NORVASC) 10 MG tablet Take 1 tablet (10 mg total) by mouth daily. 30 tablet 12  . aspirin EC 81 MG tablet Take 1 tablet (81 mg total) by mouth daily. 150 tablet 2  . glucose blood (ONETOUCH VERIO) test strip usar 3 tiempos del dia antes de comida dx code E11.9 insulin requiring 100 each 5  . hydrochlorothiazide (HYDRODIURIL) 25 MG tablet TAKE ONE TABLET BY MOUTH ONCE DAILY 90 tablet 3  . hydrocortisone cream 1 % APPLY TO AFFECTED AREA(S) TWICE DAILY 15 g 0  . Insulin Glargine (LANTUS) 100 UNIT/ML Solostar Pen Take 12 units in the morning and 8 units in evening 9 mL 3  . Insulin Pen Needle (RELION MINI PEN NEEDLES) 31G X 6 MM MISC Use to inject Lantus insulin once daily 100  each 6  . loratadine (CLARITIN) 10 MG tablet Take 1 tablet (10 mg total) by mouth daily. 30 tablet 3  . metFORMIN (GLUCOPHAGE) 1000 MG tablet Take 1 tablet (1,000 mg total) by mouth 2 (two) times daily with a meal. 180 tablet 4  . ONETOUCH DELICA LANCETS FINE MISC 1 each by Does not apply route 3 (three) times daily before meals. 99 each 6   No current facility-administered medications for this visit.   Family History  Problem Relation Age of Onset  . Diabetes Father   . Diabetes Brother    History   Social History  . Marital Status: Single    Spouse Name: N/A  . Number of Children: 5  . Years of Education: N/A   Occupational History  . Unemployed    Social History Main Topics  . Smoking status: Former Smoker    Quit date: 07/25/2006  . Smokeless tobacco: Never Used  . Alcohol Use: No  . Drug Use: No  . Sexual Activity: Not on file   Other Topics Concern  . None   Social History Narrative    Patient used to work for [employer], denies any alcohol smoking or illicit drug use.   Review of Systems: Pertinent items are noted in HPI. Objective:  Physical Exam: Filed Vitals:   10/08/14 1032  BP: 132/72  Pulse: 78  Temp: 98.1 F (36.7 C)  TempSrc: Oral  Height: 5\' 4"  (1.626 m)  Weight: 152 lb (68.947 kg)  SpO2: 100%   General: sitting in chair, NAD Cardiac: RRR, no rubs, murmurs or gallops Pulm: clear to auscultation bilaterally, moving normal volumes of air Ext: warm and well perfused, no pedal edema  Assessment & Plan:  Please see problem oriented charting  Pt discussed with Dr. Lynnae January

## 2014-10-08 NOTE — Assessment & Plan Note (Signed)
BP Readings from Last 3 Encounters:  10/08/14 132/72  08/09/14 134/73  04/26/14 121/69    Lab Results  Component Value Date   NA 141 11/16/2013   K 4.6 11/16/2013   CREATININE 1.42* 11/16/2013    Assessment: Blood pressure control:   Progress toward BP goal:    Comments: maintains good control   Plan: Medications:  continue current medications of amlodipine 10mg  and HCTZ 25mg   Educational resources provided: brochure (denies) Self management tools provided:   Other plans: at f/u in 1 month consider adding or substituting for an ACEi since pt has increasing microalbuminuria

## 2014-10-10 NOTE — Progress Notes (Signed)
Internal Medicine Clinic Attending  Case discussed with Dr. Sadek soon after the resident saw the patient.  We reviewed the resident's history and exam and pertinent patient test results.  I agree with the assessment, diagnosis, and plan of care documented in the resident's note. 

## 2014-12-27 ENCOUNTER — Ambulatory Visit: Payer: Medicare Other | Admitting: Internal Medicine

## 2015-01-10 ENCOUNTER — Ambulatory Visit (INDEPENDENT_AMBULATORY_CARE_PROVIDER_SITE_OTHER): Payer: Medicare Other | Admitting: Internal Medicine

## 2015-01-10 ENCOUNTER — Encounter: Payer: Self-pay | Admitting: Internal Medicine

## 2015-01-10 VITALS — BP 137/75 | HR 73 | Temp 98.2°F | Ht 64.0 in | Wt 153.6 lb

## 2015-01-10 DIAGNOSIS — E119 Type 2 diabetes mellitus without complications: Secondary | ICD-10-CM | POA: Diagnosis not present

## 2015-01-10 DIAGNOSIS — Z79899 Other long term (current) drug therapy: Secondary | ICD-10-CM

## 2015-01-10 DIAGNOSIS — Z794 Long term (current) use of insulin: Secondary | ICD-10-CM

## 2015-01-10 DIAGNOSIS — I10 Essential (primary) hypertension: Secondary | ICD-10-CM

## 2015-01-10 DIAGNOSIS — E113319 Type 2 diabetes mellitus with moderate nonproliferative diabetic retinopathy with macular edema, unspecified eye: Secondary | ICD-10-CM

## 2015-01-10 DIAGNOSIS — E11319 Type 2 diabetes mellitus with unspecified diabetic retinopathy without macular edema: Secondary | ICD-10-CM

## 2015-01-10 DIAGNOSIS — E118 Type 2 diabetes mellitus with unspecified complications: Secondary | ICD-10-CM

## 2015-01-10 LAB — POCT GLYCOSYLATED HEMOGLOBIN (HGB A1C): HEMOGLOBIN A1C: 8

## 2015-01-10 LAB — GLUCOSE, CAPILLARY: GLUCOSE-CAPILLARY: 270 mg/dL — AB (ref 65–99)

## 2015-01-10 MED ORDER — INSULIN GLARGINE 100 UNIT/ML SOLOSTAR PEN: PEN_INJECTOR | SUBCUTANEOUS | Status: DC

## 2015-01-10 MED ORDER — LISINOPRIL 5 MG PO TABS: 20.0000 mg | ORAL_TABLET | Freq: Every day | ORAL | Status: DC

## 2015-01-10 MED ORDER — AMLODIPINE BESYLATE 5 MG PO TABS: 5.0000 mg | ORAL_TABLET | Freq: Every day | ORAL | Status: DC

## 2015-01-10 MED ORDER — METFORMIN HCL 1000 MG PO TABS: 1000.0000 mg | ORAL_TABLET | Freq: Two times a day (BID) | ORAL | Status: DC

## 2015-01-10 NOTE — Assessment & Plan Note (Signed)
Lab Results  Component Value Date   HGBA1C 8.0 01/10/2015   HGBA1C 8.0 08/09/2014   HGBA1C 6.9 04/26/2014     Assessment: Diabetes control:   Progress toward A1C goal:    Comments: Patient reports that his BGs were doing well until 1 week ago when the abruptly jumped up. He denies any changes in diet, recent illnesses or starting any new medications. His insulin was recently filled and is not expired. He brought his meter in today which showed 63 readings with an average of 165 and 52% within his target ranges of 70-130 before meals and 90-180 after meals. No episodes of hypoglycemia. His meter does confirm that his blood sugars were fairly well controlled with most readings <190 until about 1 week ago when his sugars began to consistently be >200. Most of his higher readings were in the morning. CBG in clinic today was 207.  Patient is currently on Lantus 12 units in the morning and 8 units at night. Metformin 1000 mg bid.   Plan: Medications:  Increase his nighttime lantus from 8 units to 10 units. Home glucose monitoring: Frequency:   Timing:   Instruction/counseling given: reminded to get eye exam, reminded to bring blood glucose meter & log to each visit and reminded to bring medications to each visit Educational resources provided:   Self management tools provided:   Other plans: RTC in 1 month for follow up. Microalbumin/Cr today. BMET today.

## 2015-01-10 NOTE — Assessment & Plan Note (Signed)
Has an appointment with [facility] in September for yearly eye exam.

## 2015-01-10 NOTE — Progress Notes (Signed)
Patient ID: Dustin Burns, male   DOB: [birth year], 64 y.o.   MRN: [id number]   Subjective:   Patient ID: Dustin Burns male   DOB: [birth year] 64 y.o.   MRN: [id number]  HPI: Dustin Burns is a 64 y.o. Spanish speaking male with a PMH of DM type II with peripheral neuropathy, HTN, and GERD presenting to clinic today for HTN and DM follow up.   For details of today's visit please refer to problem based charting.  Past Medical History  Diagnosis Date  . Dyslipidemia   . Type 2 diabetes mellitus   . Meralgia paresthetica   . Diabetic peripheral neuropathy   . Hypertension   . Acute gastritis     hx of  . Lumbar back pain   . Tubular adenoma of rectum 06/20/2010   Current Outpatient Prescriptions  Medication Sig Dispense Refill  . amLODipine (NORVASC) 5 MG tablet Take 1 tablet (5 mg total) by mouth daily. 30 tablet 12  . aspirin EC 81 MG tablet Take 1 tablet (81 mg total) by mouth daily. 150 tablet 2  . glucose blood (ONETOUCH VERIO) test strip usar 3 tiempos del dia antes de comida dx code E11.9 insulin requiring 100 each 5  . hydrochlorothiazide (HYDRODIURIL) 25 MG tablet TAKE ONE TABLET BY MOUTH ONCE DAILY 90 tablet 3  . hydrocortisone cream 1 % APPLY TO AFFECTED AREA(S) TWICE DAILY 15 g 0  . Insulin Glargine (LANTUS) 100 UNIT/ML Solostar Pen Take 12 units in the morning and 10 units in evening 9 mL 3  . Insulin Pen Needle (RELION MINI PEN NEEDLES) 31G X 6 MM MISC Use to inject Lantus insulin once daily 100 each 6  . lisinopril (PRINIVIL,ZESTRIL) 5 MG tablet Take 4 tablets (20 mg total) by mouth daily. 30 tablet 2  . loratadine (CLARITIN) 10 MG tablet Take 1 tablet (10 mg total) by mouth daily. 30 tablet 3  . metFORMIN (GLUCOPHAGE) 1000 MG tablet Take 1 tablet (1,000 mg total) by mouth 2 (two) times daily with a meal. 180 tablet 4  . ONETOUCH DELICA LANCETS FINE MISC 1 each by Does not apply route 3 (three) times daily before meals. 99 each 6   No current facility-administered  medications for this visit.   Family History  Problem Relation Age of Onset  . Diabetes Father   . Diabetes Brother    Social History   Social History  . Marital Status: Single    Spouse Name: N/A  . Number of Children: 5  . Years of Education: N/A   Occupational History  . Unemployed    Social History Main Topics  . Smoking status: Former Smoker    Quit date: 07/25/2006  . Smokeless tobacco: Never Used  . Alcohol Use: No  . Drug Use: No  . Sexual Activity: Not Asked   Other Topics Concern  . None   Social History Narrative    Patient used to work for [employer], denies any alcohol smoking or illicit drug use.   Review of Systems: Review of Systems  Constitutional: Negative for fever and chills.  Eyes: Negative for blurred vision.  Respiratory: Negative for shortness of breath.   Cardiovascular: Negative for chest pain.  Gastrointestinal: Negative for nausea, vomiting, abdominal pain, diarrhea and constipation.  Neurological: Negative for dizziness.   Objective:  Physical Exam:  Filed Vitals:   01/10/15 1409  BP: 137/75  Pulse: 73  Temp: 98.2 F (36.8 C)  TempSrc: Oral  Height: 5\' 4"  (1.626 m)  Weight: 153 lb 9.6 oz (69.673 kg)  SpO2: 99%   GENERAL- alert, co-operative, appears as stated age, not in any distress. HEENT- Atraumatic, normocephalic, PERRL, EOMI, oral mucosa appears moist, good and intact dentition. CARDIAC- RRR, no murmurs, rubs or gallops. RESP- Moving equal volumes of air, and clear to auscultation bilaterally, no wheezes or crackles. ABDOMEN- Soft, nontender, no guarding or rebound, no palpable masses or organomegaly, bowel sounds present. NEURO- No obvious Cr N abnormality,  EXTREMITIES- pulse 2+, symmetric, no pedal edema. SKIN- Warm, dry, No rash or lesion. PSYCH- Normal mood and affect, appropriate thought content and speech.  Assessment & Plan:   Case discussed with Dr. Daryll Drown. Please refer to Problem based carting for  further details of today's visit.

## 2015-01-10 NOTE — Assessment & Plan Note (Signed)
BP Readings from Last 3 Encounters:  01/10/15 137/75  10/08/14 132/72  08/09/14 134/73    Lab Results  Component Value Date   NA 141 11/16/2013   K 4.6 11/16/2013   CREATININE 1.42* 11/16/2013    Assessment: Blood pressure control:  well controlled Progress toward BP goal:   at goal Comments: Patient reports compliance with his medications. Currently taking Amlodipine 10 mg daily and HCTZ 25 mg daily.  Plan: Medications:  Will decrease Amlodpine to 5 mg daily and add Lisinopril 5 mg daily. Educational resources provided:   Self management tools provided:   Other plans: Patient's last microalbuminuria a year ago with significantly elevated micro/Cr. Already with CKD stage II-IIIa. Would benefit from being on an ACE-i. On chart review, he was on ACE-i monotherapy in the past and developed hyperkalemia and it appears he is at the upper limits of normal K at baseline. He is now on HCTZ 25 mg which should help balance out the hyperkalemia affects of lisinopril. Will check BMP and micro/Cr today. RTC in 1 month for recheck and repeat BMP.

## 2015-01-10 NOTE — Patient Instructions (Signed)
General Instructions: Thank you for coming in today.  - I have decreased your Amlodipine from 10 mg to 5 mg daily and started you on a new medication called Lisinopril - I have also increased your nighttime Lantus dose to 10 units - Please continue to check your blood sugars at home, watch out for any lightheadedness, dizziness, or any other signs of low blood sugar - Return to clinic in 1 month for follow up  Treatment Goals:  Goals (1 Years of Data) as of 01/10/15          As of Today 10/08/14 08/09/14 04/26/14 02/23/14     Blood Pressure   . Blood Pressure < 140/90  137/75 132/72 134/73 121/69 129/66     Result Component   . HEMOGLOBIN A1C < 7.0  8.0  8.0 6.9    . LDL CALC < 100            Progress Toward Treatment Goals:  Treatment Goal 08/09/2014  Hemoglobin A1C deteriorated  Blood pressure at goal    Self Care Goals & Plans:  Self Care Goal 10/08/2014  Manage my medications take my medicines as prescribed; bring my medications to every visit; refill my medications on time  Monitor my health -  Eat healthy foods drink diet soda or water instead of juice or soda; eat more vegetables; eat foods that are low in salt; eat baked foods instead of fried foods; eat fruit for snacks and desserts  Be physically active -  Meeting treatment goals -    Home Blood Glucose Monitoring 08/09/2014  Check my blood sugar 2 times a day  When to check my blood sugar -     Care Management & Community Referrals:  Referral 04/26/2014  Referrals made for care management support none needed  Referrals made to community resources -

## 2015-01-11 ENCOUNTER — Telehealth: Payer: Self-pay | Admitting: *Deleted

## 2015-01-11 DIAGNOSIS — I10 Essential (primary) hypertension: Secondary | ICD-10-CM

## 2015-01-11 LAB — BASIC METABOLIC PANEL
BUN / CREAT RATIO: 16 (ref 10–22)
BUN: 28 mg/dL — ABNORMAL HIGH (ref 8–27)
CHLORIDE: 97 mmol/L (ref 97–108)
CO2: 22 mmol/L (ref 18–29)
Calcium: 9.3 mg/dL (ref 8.6–10.2)
Creatinine, Ser: 1.76 mg/dL — ABNORMAL HIGH (ref 0.76–1.27)
GFR calc Af Amer: 47 mL/min/{1.73_m2} — ABNORMAL LOW (ref >59)
GFR, EST NON AFRICAN AMERICAN: 41 mL/min/{1.73_m2} — AB (ref >59)
Glucose: 315 mg/dL — ABNORMAL HIGH (ref 65–99)
POTASSIUM: 5.4 mmol/L — AB (ref 3.5–5.2)
SODIUM: 135 mmol/L (ref 134–144)

## 2015-01-11 LAB — MICROALBUMIN / CREATININE URINE RATIO
CREATININE, UR: 193 mg/dL
MICROALB/CREAT RATIO: 376.8 mg/g creat — ABNORMAL HIGH (ref 0.0–30.0)
MICROALBUM., U, RANDOM: 727.3 ug/mL

## 2015-01-11 MED ORDER — LISINOPRIL 5 MG PO TABS: 5.0000 mg | ORAL_TABLET | Freq: Every day | ORAL | Status: DC

## 2015-01-11 NOTE — Telephone Encounter (Signed)
I only wanted him on 5 mg daily not 20 mg. I must have forgotten to change the frequency when I changed the dose to 5 mg. I fixed it and sent in a new prescription. Thanks.

## 2015-01-11 NOTE — Telephone Encounter (Signed)
Please review the script you sent for lisinopril, 5mg  with 4 daily would be 120 but you could do 20mg  daily and 30 or do 90 days for #90 or 360. Please send a new script

## 2015-01-12 NOTE — Progress Notes (Signed)
Internal Medicine Clinic Attending  I saw and evaluated the patient.  I personally confirmed the key portions of the history and exam documented by Dr. Boswell and I reviewed pertinent patient test results.  The assessment, diagnosis, and plan were formulated together and I agree with the documentation in the resident's note. 

## 2015-02-04 DIAGNOSIS — E11359 Type 2 diabetes mellitus with proliferative diabetic retinopathy without macular edema: Secondary | ICD-10-CM | POA: Diagnosis not present

## 2015-02-04 DIAGNOSIS — E11351 Type 2 diabetes mellitus with proliferative diabetic retinopathy with macular edema: Secondary | ICD-10-CM | POA: Diagnosis not present

## 2015-02-04 LAB — HM DIABETES EYE EXAM

## 2015-02-10 ENCOUNTER — Encounter: Payer: Self-pay | Admitting: Internal Medicine

## 2015-02-10 ENCOUNTER — Ambulatory Visit (INDEPENDENT_AMBULATORY_CARE_PROVIDER_SITE_OTHER): Payer: Medicare Other | Admitting: Internal Medicine

## 2015-02-10 VITALS — BP 122/74 | HR 78 | Temp 97.7°F | Ht 64.0 in | Wt 152.1 lb

## 2015-02-10 DIAGNOSIS — I10 Essential (primary) hypertension: Secondary | ICD-10-CM | POA: Diagnosis not present

## 2015-02-10 DIAGNOSIS — E119 Type 2 diabetes mellitus without complications: Secondary | ICD-10-CM

## 2015-02-10 DIAGNOSIS — E118 Type 2 diabetes mellitus with unspecified complications: Secondary | ICD-10-CM

## 2015-02-10 DIAGNOSIS — Z79899 Other long term (current) drug therapy: Secondary | ICD-10-CM

## 2015-02-10 DIAGNOSIS — Z87891 Personal history of nicotine dependence: Secondary | ICD-10-CM

## 2015-02-10 LAB — GLUCOSE, CAPILLARY: GLUCOSE-CAPILLARY: 132 mg/dL — AB (ref 65–99)

## 2015-02-10 NOTE — Assessment & Plan Note (Signed)
BP Readings from Last 3 Encounters:  02/10/15 122/74  01/10/15 137/75  10/08/14 132/72  Patient currently taking Amlodipine 5 mg daily (decreaed from 10 mg on last visit) and HCTZ 25 mg daily. He was started on Lisinopril 5 mg on last visit, but has not taken as he does not tolerate due to symptoms of dizziness and generally feeling unwell. He had previously been on ACE-i which resulted in hyperkalemia. Potassium on last visit 8/29 was 5.4 and Cr 1.76. Micro/CR was 376.  Blood pressure is well controlled for now on Amlodipine and HCTZ, may need to consider change in therapy in future for renal function with Verapamil. -Continue Amlodipine 5 mg and HCTZ 25 mg daily -Discontinue Lisinopril 5 mg -Repeat BMP

## 2015-02-10 NOTE — Patient Instructions (Signed)
We will continue your Lantus 12 units in the morning and 10 units at night.  You may stop taking Lisinopril.  Please continue Amlodipine and HCTZ (Hydrochlorothiazide)  We will do a blood test to check your kidney function today.

## 2015-02-10 NOTE — Assessment & Plan Note (Signed)
Patient with Hgb A1C of 8.0 on 8/29. He is currently taking Lantus 12 units in the morning and 10 units at night (increased from 8 at night on last visit). Also on Metformin 1000 mg bid. Patient brought his glucometer in with average readings of 161 and range 84-321. He checks his blood sugar twice a day and does not report feeling symptoms of high blood sugars. He has occasional low symptoms of low energy which is corrected with food or drink. He denies any lows where he feels like he is going to pass out. Most of his readings in the last 2 weeks have been under good control. He says he eats meals 30 minutes after each insulin administration and reports compliance with a diabetic diet.  His blood sugars are currently under control, will not make any changes at this time.  -Continue Lantus 12 Units am and 10 units at night -Continue Metformin 1000 mg BID -f/u in 2 months for Hgb A1C

## 2015-02-10 NOTE — Progress Notes (Signed)
Patient ID: Dustin Burns, male   DOB: 03-30-1951, 64 y.o.   MRN: [id number]   Subjective:   Patient ID: Dustin Burns male   DOB: 1950-10-26 64 y.o.   MRN: [id number]  HPI: Dustin Burns is a 64 y.o. male with PMH as listed below who presents for follow up for management of HTN and DM. He is Spanish speaking and communication is done through a Optometrist.  Patient currently taking Amlodipine 5 mg daily (decreaed from 10 mg on last visit) and HCTZ 25 mg daily. He was started on Lisinopril 5 mg on last visit, but has not taken as he does not tolerate due to symptoms of dizziness and generally feeling unwell. He had previously been on ACE-i which resulted in hyperkalemia. Potassium on last visit 8/29 was 5.4 and Cr 1.76. Micro/CR was 376.  Patient with Hgb A1C of 8.0 on 8/29. He is currently taking Lantus 12 units in the morning and 10 units at night (increased from 8 at night on last visit). Also on Metformin 1000 mg bid. Patient brought his glucometer in with average readings of 161 and range 84-321. He checks his blood sugar twice a day and does not report feeling symptoms of high blood sugars. He has occasional low symptoms of low energy which is corrected with food or drink. He denies any lows where he feels like he is going to pass out. Most of his readings in the last 2 weeks have been under good control. He says he eats meals 30 minutes after each insulin administration and reports compliance with a diabetic diet.  Please see problem list for status of chronic conditions.  Past Medical History  Diagnosis Date  . Dyslipidemia   . Type 2 diabetes mellitus   . Meralgia paresthetica   . Diabetic peripheral neuropathy   . Hypertension   . Acute gastritis     hx of  . Lumbar back pain   . Tubular adenoma of rectum 06/20/2010   Current Outpatient Prescriptions  Medication Sig Dispense Refill  . amLODipine (NORVASC) 5 MG tablet Take 1 tablet (5 mg total) by mouth daily. 30 tablet 12  .  hydrochlorothiazide (HYDRODIURIL) 25 MG tablet TAKE ONE TABLET BY MOUTH ONCE DAILY 90 tablet 3  . Insulin Glargine (LANTUS) 100 UNIT/ML Solostar Pen Take 12 units in the morning and 10 units in evening 9 mL 3  . metFORMIN (GLUCOPHAGE) 1000 MG tablet Take 1 tablet (1,000 mg total) by mouth 2 (two) times daily with a meal. 180 tablet 4  . aspirin EC 81 MG tablet Take 1 tablet (81 mg total) by mouth daily. 150 tablet 2  . glucose blood (ONETOUCH VERIO) test strip usar 3 tiempos del dia antes de comida dx code E11.9 insulin requiring 100 each 5  . hydrocortisone cream 1 % APPLY TO AFFECTED AREA(S) TWICE DAILY 15 g 0  . Insulin Pen Needle (RELION MINI PEN NEEDLES) 31G X 6 MM MISC Use to inject Lantus insulin once daily 100 each 6  . loratadine (CLARITIN) 10 MG tablet Take 1 tablet (10 mg total) by mouth daily. 30 tablet 3  . ONETOUCH DELICA LANCETS FINE MISC 1 each by Does not apply route 3 (three) times daily before meals. 99 each 6   No current facility-administered medications for this visit.   Family History  Problem Relation Age of Onset  . Diabetes Father   . Diabetes Brother    Social History   Social History  . Marital Status: Single  Spouse Name: N/A  . Number of Children: 5  . Years of Education: N/A   Occupational History  . Unemployed    Social History Main Topics  . Smoking status: Former Smoker    Quit date: 07/25/2006  . Smokeless tobacco: Never Used  . Alcohol Use: No  . Drug Use: No  . Sexual Activity: Not Asked   Other Topics Concern  . None   Social History Narrative    Patient used to work for [employer], denies any alcohol smoking or illicit drug use.   Review of Systems: Review of Systems  Constitutional: Negative for fever and chills.  Eyes: Negative for blurred vision and pain.  Respiratory: Negative for cough, shortness of breath and wheezing.   Cardiovascular: Negative for chest pain, palpitations and leg swelling.  Gastrointestinal:  Negative for nausea, vomiting and abdominal pain.  Neurological: Negative for dizziness, tingling and headaches.    Objective:  Physical Exam: Filed Vitals:   02/10/15 1019  BP: 122/74  Pulse: 78  Temp: 97.7 F (36.5 C)  TempSrc: Oral  Height: 5\' 4"  (1.626 m)  Weight: 152 lb 1.6 oz (68.992 kg)  SpO2: 100%   Physical Exam  Constitutional: He appears well-developed and well-nourished.  HENT:  Head: Normocephalic and atraumatic.  Eyes: Pupils are equal, round, and reactive to light.  Cardiovascular: Normal rate and regular rhythm.   No murmur heard. Pulmonary/Chest: Effort normal and breath sounds normal. No respiratory distress. He has no wheezes.  Abdominal: Soft. There is no tenderness.  Musculoskeletal: Normal range of motion. He exhibits no edema or tenderness.  Skin: Skin is warm.    Assessment & Plan:

## 2015-02-11 LAB — BMP8+ANION GAP
Anion Gap: 19 mmol/L — ABNORMAL HIGH (ref 10.0–18.0)
BUN / CREAT RATIO: 16 (ref 10–22)
BUN: 26 mg/dL (ref 8–27)
CO2: 22 mmol/L (ref 18–29)
CREATININE: 1.58 mg/dL — AB (ref 0.76–1.27)
Calcium: 9.3 mg/dL (ref 8.6–10.2)
Chloride: 98 mmol/L (ref 97–108)
GFR calc non Af Amer: 46 mL/min/{1.73_m2} — ABNORMAL LOW (ref >59)
GFR, EST AFRICAN AMERICAN: 53 mL/min/{1.73_m2} — AB (ref >59)
GLUCOSE: 152 mg/dL — AB (ref 65–99)
Potassium: 4.7 mmol/L (ref 3.5–5.2)
SODIUM: 139 mmol/L (ref 134–144)

## 2015-02-13 NOTE — Progress Notes (Signed)
Internal Medicine Clinic Attending  I saw and evaluated the patient.  I personally confirmed the key portions of the history and exam documented by Dr. Patel,Vishal and I reviewed pertinent patient test results.  The assessment, diagnosis, and plan were formulated together and I agree with the documentation in the resident's note.  

## 2015-02-25 ENCOUNTER — Telehealth: Payer: Self-pay | Admitting: Internal Medicine

## 2015-02-25 NOTE — Telephone Encounter (Signed)
Pt's last ov with dr Baird Cancer 02/04/2015 being faxed.

## 2015-02-28 ENCOUNTER — Encounter: Payer: Self-pay | Admitting: *Deleted

## 2015-04-01 ENCOUNTER — Encounter: Payer: Self-pay | Admitting: Internal Medicine

## 2015-04-01 ENCOUNTER — Ambulatory Visit (INDEPENDENT_AMBULATORY_CARE_PROVIDER_SITE_OTHER): Payer: Medicare Other | Admitting: Internal Medicine

## 2015-04-01 DIAGNOSIS — Z87891 Personal history of nicotine dependence: Secondary | ICD-10-CM | POA: Diagnosis not present

## 2015-04-01 DIAGNOSIS — I10 Essential (primary) hypertension: Secondary | ICD-10-CM | POA: Diagnosis not present

## 2015-04-01 DIAGNOSIS — Z794 Long term (current) use of insulin: Secondary | ICD-10-CM | POA: Diagnosis not present

## 2015-04-01 DIAGNOSIS — E1142 Type 2 diabetes mellitus with diabetic polyneuropathy: Secondary | ICD-10-CM | POA: Diagnosis not present

## 2015-04-01 DIAGNOSIS — Z0000 Encounter for general adult medical examination without abnormal findings: Secondary | ICD-10-CM

## 2015-04-01 DIAGNOSIS — Z79899 Other long term (current) drug therapy: Secondary | ICD-10-CM | POA: Diagnosis not present

## 2015-04-01 DIAGNOSIS — E118 Type 2 diabetes mellitus with unspecified complications: Secondary | ICD-10-CM

## 2015-04-01 LAB — POCT GLYCOSYLATED HEMOGLOBIN (HGB A1C): HEMOGLOBIN A1C: 7.4

## 2015-04-01 LAB — GLUCOSE, CAPILLARY: Glucose-Capillary: 150 mg/dL — ABNORMAL HIGH (ref 65–99)

## 2015-04-01 NOTE — Progress Notes (Signed)
Subjective:   Patient ID: Dustin Burns male   DOB: Sep 13, 1950 64 y.o.   MRN: [id number]  HPI: [name] is a 64 y.o. Spanish speaking man with PMHx of hypertension, dyslipidemia, and Type 2 Diabetes mellitus with complications of retinopathy, CKD, and peripheral neuropathy presents for follow up approximately 10 weeks after a change to his diabetes management.  See problem based assessment and plan below for additional details.    Past Medical History  Diagnosis Date  . Dyslipidemia   . Type 2 diabetes mellitus ([place])   . Meralgia paresthetica   . Diabetic peripheral neuropathy ([place])   . Hypertension   . Acute gastritis     hx of  . Lumbar back pain   . Tubular adenoma of rectum 06/20/2010   Current Outpatient Prescriptions  Medication Sig Dispense Refill  . amLODipine (NORVASC) 5 MG tablet Take 1 tablet (5 mg total) by mouth daily. 30 tablet 12  . aspirin EC 81 MG tablet Take 1 tablet (81 mg total) by mouth daily. 150 tablet 2  . glucose blood (ONETOUCH VERIO) test strip usar 3 tiempos del dia antes de comida dx code E11.9 insulin requiring 100 each 5  . hydrochlorothiazide (HYDRODIURIL) 25 MG tablet TAKE ONE TABLET BY MOUTH ONCE DAILY 90 tablet 3  . hydrocortisone cream 1 % APPLY TO AFFECTED AREA(S) TWICE DAILY 15 g 0  . Insulin Glargine (LANTUS) 100 UNIT/ML Solostar Pen Take 12 units in the morning and 10 units in evening 9 mL 3  . Insulin Pen Needle (RELION MINI PEN NEEDLES) 31G X 6 MM MISC Use to inject Lantus insulin once daily 100 each 6  . loratadine (CLARITIN) 10 MG tablet Take 1 tablet (10 mg total) by mouth daily. 30 tablet 3  . metFORMIN (GLUCOPHAGE) 1000 MG tablet Take 1 tablet (1,000 mg total) by mouth 2 (two) times daily with a meal. 180 tablet 4  . ONETOUCH DELICA LANCETS FINE MISC 1 each by Does not apply route 3 (three) times daily before meals. 99 each 6   No current facility-administered medications for this visit.   Family History  Problem Relation  Age of Onset  . Diabetes Father   . Diabetes Brother    Social History   Social History  . Marital Status: Single    Spouse Name: N/A  . Number of Children: 5  . Years of Education: N/A   Occupational History  . Unemployed    Social History Main Topics  . Smoking status: Former Smoker    Quit date: 07/25/2006  . Smokeless tobacco: Never Used  . Alcohol Use: No  . Drug Use: No  . Sexual Activity: Not Asked   Other Topics Concern  . None   Social History Narrative    Patient used to work for [employer], denies any alcohol smoking or illicit drug use.   Review of Systems: Review of Systems  Constitutional: Negative for fever and chills.  Eyes: Negative for blurred vision.  Respiratory: Negative for cough.   Cardiovascular: Negative for chest pain.  Neurological: Negative for dizziness, tremors, focal weakness, loss of consciousness and headaches.    Objective:  Physical Exam: Filed Vitals:   04/01/15 1518  BP: 143/75  Pulse: 76  Temp: 98 F (36.7 C)  TempSrc: Oral  Height: 5\' 4"  (1.626 m)  Weight: 154 lb 9.6 oz (70.126 kg)  SpO2: 100%   GENERAL- alert, co-operative, NAD HEENT- Atraumatic, PERRL, EOMI, oral mucosa appears moist, good and intact dentition.  CARDIAC- RRR, no murmurs, rubs or gallops. RESP- CTAB, no wheezes or crackles. ABDOMEN- Soft, nontender, no guarding or rebound, normoactive bowel sounds present NEURO- No obvious Cr N abnormality, strength upper and lower extremities- 5/5, Sensation intact- globally EXTREMITIES- no pedal edema. SKIN- Warm, dry, No rash or lesion. PSYCH- Normal mood and affect, appropriate thought content and speech.    Assessment & Plan:

## 2015-04-01 NOTE — Assessment & Plan Note (Signed)
Patient adamantly refuses influenza vaccination, insisting he had severe reaction or illness to his last injection.

## 2015-04-01 NOTE — Assessment & Plan Note (Signed)
BP Readings from Last 3 Encounters:  04/01/15 143/75  02/10/15 122/74  01/10/15 137/75    Lab Results  Component Value Date   NA 139 02/10/2015   K 4.7 02/10/2015   CREATININE 1.58* 02/10/2015  He is now off previous therapy og 5mg  lisinopril due to developing hyperkalemia on even low dose ACE-I in setting of worsening CKD.  Assessment: Blood pressure control: Mildly hypertensive Progress toward BP goal:  Worsening Comments: Will observe for now, May consider change of therapy at next visit particularly from amlodipine to centrally active CCB for protection of kidneys from further chronic hyperfiltration.  Plan: Medications:  continue current medications Educational resources provided:   Self management tools provided:   Other plans: Reassess at 3 months, if remains similarly hypertensive will look to change medical management.

## 2015-04-01 NOTE — Assessment & Plan Note (Addendum)
Lab Results  Component Value Date   HGBA1C 7.4 04/01/2015   HGBA1C 8.0 01/10/2015   HGBA1C 8.0 08/09/2014     Mr. Gravell was noted to have a Hgb A1c of 8.0 on 8/29 which prompted increased lantus dose to 12U in AM and 10U in PM. Over this time he continues to walk up to 4 times weekly for exercise. He monitors home blood glucose twice per day usually before but sometimes after meals. He does have several episodes of mild hypoglycemia he describes as transient weakness that passes with eating or drinking. He has had no episodes of LOC, diaphoresis, or seizure from hypoglycemia.  Assessment: Diabetes control: At/near goal Progress toward A1C goal:  Improvement Comments: Control much improved with recent change to 12U morning 10U night of lantus plus his 1g metformin BID.  Plan: Medications:  continue 12U morning 10U night of lantus plus his 1g metformin BID.  Patient encouraged to continue Home glucose monitoring, his current regimen is: Frequency: BID Timing: pre-meal usually, sometimes post-meal Instruction/counseling given: reminded to bring blood glucose meter & log to each visit and discussed diet Educational resources provided: Copy of glucometer printout Self management tools provided:   Other plans: Patient encouraged to continue diet and walking components of diabetes control. Follow up at 3 months for repeat Hgb A1c, glucometer check.

## 2015-04-01 NOTE — Patient Instructions (Signed)
Continue taking insulin as directed with 12U in the morning and 10U at night. You are doing a great job!  We will recheck your progress again in approximately 3 months.

## 2015-04-04 NOTE — Progress Notes (Signed)
Internal Medicine Clinic Attending  I saw and evaluated the patient.  I personally confirmed the key portions of the history and exam documented by Dr. Rice and I reviewed pertinent patient test results.  The assessment, diagnosis, and plan were formulated together and I agree with the documentation in the resident's note.  

## 2015-07-19 ENCOUNTER — Other Ambulatory Visit: Payer: Self-pay | Admitting: Internal Medicine

## 2015-07-28 ENCOUNTER — Telehealth: Payer: Self-pay | Admitting: Internal Medicine

## 2015-07-28 NOTE — Telephone Encounter (Signed)
APPT. REMINDER CALL, LMTCB °

## 2015-07-29 ENCOUNTER — Encounter: Payer: Medicare Other | Admitting: Internal Medicine

## 2015-08-08 ENCOUNTER — Other Ambulatory Visit: Payer: Self-pay | Admitting: Internal Medicine

## 2015-08-11 ENCOUNTER — Telehealth: Payer: Self-pay | Admitting: Internal Medicine

## 2015-08-11 NOTE — Telephone Encounter (Signed)
APPT. REMINDER CALL, LMTCB °

## 2015-08-15 ENCOUNTER — Ambulatory Visit (INDEPENDENT_AMBULATORY_CARE_PROVIDER_SITE_OTHER): Payer: Medicare Other | Admitting: Internal Medicine

## 2015-08-15 ENCOUNTER — Encounter: Payer: Self-pay | Admitting: Internal Medicine

## 2015-08-15 VITALS — BP 157/76 | HR 70 | Temp 98.0°F | Wt 150.0 lb

## 2015-08-15 DIAGNOSIS — Z79899 Other long term (current) drug therapy: Secondary | ICD-10-CM | POA: Diagnosis not present

## 2015-08-15 DIAGNOSIS — E1142 Type 2 diabetes mellitus with diabetic polyneuropathy: Secondary | ICD-10-CM

## 2015-08-15 DIAGNOSIS — I129 Hypertensive chronic kidney disease with stage 1 through stage 4 chronic kidney disease, or unspecified chronic kidney disease: Secondary | ICD-10-CM | POA: Diagnosis not present

## 2015-08-15 DIAGNOSIS — I10 Essential (primary) hypertension: Secondary | ICD-10-CM | POA: Diagnosis not present

## 2015-08-15 DIAGNOSIS — E1122 Type 2 diabetes mellitus with diabetic chronic kidney disease: Secondary | ICD-10-CM | POA: Diagnosis not present

## 2015-08-15 DIAGNOSIS — E1165 Type 2 diabetes mellitus with hyperglycemia: Secondary | ICD-10-CM

## 2015-08-15 DIAGNOSIS — N189 Chronic kidney disease, unspecified: Secondary | ICD-10-CM

## 2015-08-15 DIAGNOSIS — Z794 Long term (current) use of insulin: Secondary | ICD-10-CM

## 2015-08-15 DIAGNOSIS — J3089 Other allergic rhinitis: Secondary | ICD-10-CM

## 2015-08-15 DIAGNOSIS — Z0000 Encounter for general adult medical examination without abnormal findings: Secondary | ICD-10-CM

## 2015-08-15 DIAGNOSIS — E118 Type 2 diabetes mellitus with unspecified complications: Secondary | ICD-10-CM

## 2015-08-15 LAB — GLUCOSE, CAPILLARY: GLUCOSE-CAPILLARY: 171 mg/dL — AB (ref 65–99)

## 2015-08-15 LAB — POCT GLYCOSYLATED HEMOGLOBIN (HGB A1C): Hemoglobin A1C: 8

## 2015-08-15 MED ORDER — HYDROCHLOROTHIAZIDE 25 MG PO TABS: 25.0000 mg | ORAL_TABLET | Freq: Every day | ORAL | Status: DC

## 2015-08-15 MED ORDER — INSULIN GLARGINE 100 UNIT/ML SOLOSTAR PEN
PEN_INJECTOR | SUBCUTANEOUS | Status: DC
Start: 2015-08-15 — End: 2015-09-09

## 2015-08-15 MED ORDER — LOSARTAN POTASSIUM 25 MG PO TABS: 25.0000 mg | ORAL_TABLET | Freq: Every day | ORAL | Status: DC

## 2015-08-15 MED ORDER — METFORMIN HCL 1000 MG PO TABS: 1000.0000 mg | ORAL_TABLET | Freq: Two times a day (BID) | ORAL | Status: DC

## 2015-08-15 MED ORDER — LORATADINE 10 MG PO TABS: 10.0000 mg | ORAL_TABLET | Freq: Every day | ORAL | Status: DC

## 2015-08-15 NOTE — Progress Notes (Signed)
Subjective:   Patient ID: Dustin Burns male   DOB: January 17, 1951 65 y.o.   MRN: [id number]  HPI: [name] is a 65 y.o. Spanish speaking man with PMHx as detailed below presenting for follow up of his hypertension and diabetes. He has been checking blood sugar at home twice daily and notices an increase that he attributes to eating more than he has been before. His peripheral leg pain has been minor without worsening. He has noticed occasional low CBG readings as low as 69 but denies and episodes of symptomatic hypoglycemia. He has been taking his metformin and insulin as prescribed.  See problem based assessment and plan below for additional details.  Past Medical History  Diagnosis Date  . Dyslipidemia   . Type 2 diabetes mellitus ([place])   . Meralgia paresthetica   . Diabetic peripheral neuropathy ([place])   . Hypertension   . Acute gastritis     hx of  . Lumbar back pain   . Tubular adenoma of rectum 06/20/2010   Current Outpatient Prescriptions  Medication Sig Dispense Refill  . amLODipine (NORVASC) 5 MG tablet Take 1 tablet (5 mg total) by mouth daily. 30 tablet 12  . glucose blood (ONETOUCH VERIO) test strip usar 3 tiempos del dia antes de comida dx code E11.9 insulin requiring 100 each 5  . hydrochlorothiazide (HYDRODIURIL) 25 MG tablet TAKE ONE TABLET BY MOUTH ONCE DAILY 90 tablet 0  . hydrocortisone cream 1 % APPLY TO AFFECTED AREA(S) TWICE DAILY 15 g 0  . Insulin Glargine (LANTUS) 100 UNIT/ML Solostar Pen Take 12 units in the morning and 10 units in evening 9 mL 3  . Insulin Pen Needle (RELION MINI PEN NEEDLES) 31G X 6 MM MISC Use to inject Lantus insulin once daily 100 each 6  . loratadine (CLARITIN) 10 MG tablet Take 1 tablet (10 mg total) by mouth daily. 30 tablet 3  . metFORMIN (GLUCOPHAGE) 1000 MG tablet Take 1 tablet (1,000 mg total) by mouth 2 (two) times daily with a meal. 180 tablet 4  . ONETOUCH DELICA LANCETS FINE MISC 1 each by Does not apply route 3 (three)  times daily before meals. 99 each 6   No current facility-administered medications for this visit.   Family History  Problem Relation Age of Onset  . Diabetes Father   . Diabetes Brother    Social History   Social History  . Marital Status: Single    Spouse Name: N/A  . Number of Children: 5  . Years of Education: N/A   Occupational History  . Unemployed    Social History Main Topics  . Smoking status: Former Smoker    Quit date: 07/25/2006  . Smokeless tobacco: Never Used  . Alcohol Use: No  . Drug Use: No  . Sexual Activity: Not Asked   Other Topics Concern  . None   Social History Narrative    Patient used to work for [employer], denies any alcohol smoking or illicit drug use.   Review of Systems: Review of Systems  HENT: Positive for congestion.   Eyes: Negative for blurred vision.  Cardiovascular: Negative for leg swelling.  Gastrointestinal: Negative for abdominal pain.  Skin: Positive for itching.  Neurological: Negative for sensory change and weakness.  Endo/Heme/Allergies: Positive for environmental allergies.    Objective:  Physical Exam: Filed Vitals:   08/15/15 0952  BP: 157/76  Pulse: 70  Temp: 98 F (36.7 C)  TempSrc: Oral  Weight: 150 lb (68.04 kg)  SpO2: 100%   GENERAL- alert, co-operative, NAD HEENT- Numerous dental fillings and repairs, oral mucosa appears moist CARDIAC- RRR, no murmurs, rubs or gallops. RESP- CTAB, no wheezes or crackles. EXTREMITIES- symmetric, no pedal edema. SKIN- Warm, dry, No rash or lesion. PSYCH- Normal mood and affect, appropriate thought content and speech.  Assessment & Plan:

## 2015-08-15 NOTE — Patient Instructions (Signed)
Lantus Insulin increased to 14 units in the morning. If you feel your blood sugar get too low DECREASE this back to 12 units as before.  Start taking Losartan 25mg  once daily.  It is VERY important to return to clinic within 4-6 weeks to get re checked and adjust these treatments. Not doing so could be dangerous due to low blood sugar or high potassium.

## 2015-08-16 LAB — BMP8+ANION GAP
Anion Gap: 20 mmol/L — ABNORMAL HIGH (ref 10.0–18.0)
BUN/Creatinine Ratio: 19 (ref 10–24)
BUN: 29 mg/dL — AB (ref 8–27)
CALCIUM: 9.7 mg/dL (ref 8.6–10.2)
CHLORIDE: 103 mmol/L (ref 96–106)
CO2: 19 mmol/L (ref 18–29)
CREATININE: 1.56 mg/dL — AB (ref 0.76–1.27)
GFR, EST AFRICAN AMERICAN: 54 mL/min/{1.73_m2} — AB (ref >59)
GFR, EST NON AFRICAN AMERICAN: 47 mL/min/{1.73_m2} — AB (ref >59)
Glucose: 152 mg/dL — ABNORMAL HIGH (ref 65–99)
Potassium: 4.8 mmol/L (ref 3.5–5.2)
Sodium: 142 mmol/L (ref 134–144)

## 2015-08-16 NOTE — Assessment & Plan Note (Signed)
He reports some recent increase in his nasal congestion with infrequent coughing. This may be from some allergen exposure or vasomotor rhinitis as it has been going on a few weeks without any systemic symptoms.  Refill of loratadine 10mg  daily, no new changes to plan

## 2015-08-16 NOTE — Assessment & Plan Note (Signed)
Up to date with opthalmology exam in 01/2015. On review, patient may be a good candidate for statin therapy with his age, hypertension, and diabetes. Last lipid panel in 2015 showing mildly elevated TGs 171, HDL 40, LDL 88.

## 2015-08-16 NOTE — Progress Notes (Signed)
Internal Medicine Clinic Attending  Case discussed with Dr. Rice at the time of the visit.  We reviewed the resident's history and exam and pertinent patient test results.  I agree with the assessment, diagnosis, and plan of care documented in the resident's note.  

## 2015-08-16 NOTE — Assessment & Plan Note (Signed)
BP Readings from Last 3 Encounters:  08/15/15 157/76  04/01/15 143/75  02/10/15 122/74    Lab Results  Component Value Date   NA 142 08/15/2015   K 4.8 08/15/2015   CREATININE 1.56* 08/15/2015    Assessment: Blood pressure control: Moderately uncontrolled Progress toward BP goal:  Worsened Comments: Blood pressure has not been at goal since lisinopril was discontinued 2/2 mild, asymptomatic hyperkalemia. Currently on amlodipine and HCTZ only. Given his HTN plus diabetes with CKD he would gain a large benefit from RAAS inhibition.  Plan: Medications:  Continue amlodipine 5mg , HCTZ 25mg , start losartan 25mg  Educational resources provided:   Self management tools provided:   Other plans: RTC in a month for recheck and repeat labs on starting an ARB

## 2015-08-16 NOTE — Assessment & Plan Note (Signed)
Lab Results  Component Value Date   HGBA1C 8.0 08/15/2015   HGBA1C 7.4 04/01/2015   HGBA1C 8.0 01/10/2015     Assessment: Diabetes control: Not at goal Progress toward A1C goal:  Worsened Comments: Patient is very compliant with therapy currently taking metformin 1g BD and lantus 12U in AM and 10 in PM. He was previously suggested to take a short acting insulin as well to better stabilize his sugar throughout the day but has been very reluctant to change his treatment plan. I discussed the difficulty of controlling his blood sugars on only long acting insulin and metformin. AM CBGs were 90-170 and PM were 120-260. He states he has been taking metformin for 18 years and does not want to take any different oral medication for diabetes at this time. I spent about 10 minutes counseling him on alternative options including starting a second oral medicine changing his insulin regimen to include short acting insulin as well.  Plan: Medications:  Increase morning insulin dose to 14U Home glucose monitoring: Frequency: Twice daily Timing: before breakfast and dinner Instruction/counseling given: reminded to get eye exam, reminded to bring blood glucose meter & log to each visit and reminded to bring medications to each visit Educational resources provided:   Self management tools provided: copy of glucometer print out Other plans: Increased total basal insulin dosing in the setting of high daytime CBGs and worsened Hgb A1c in a man with known microvascular complications. This increases his risk of hypoglycemia in the mornings and I warned him to decrease his dose and contact us if he has any symptoms from this. Hopefully he will be more agreeable to improving his diabetes regimen either with starting other oral agents OR maybe transition to daily bolus insulin with once daily short acting insulin to better control his PM CBGs.

## 2015-09-09 ENCOUNTER — Ambulatory Visit (INDEPENDENT_AMBULATORY_CARE_PROVIDER_SITE_OTHER): Payer: Medicare Other | Admitting: Internal Medicine

## 2015-09-09 ENCOUNTER — Encounter: Payer: Self-pay | Admitting: Internal Medicine

## 2015-09-09 VITALS — BP 113/73 | HR 77 | Temp 98.3°F | Ht 64.0 in | Wt 153.7 lb

## 2015-09-09 DIAGNOSIS — I10 Essential (primary) hypertension: Secondary | ICD-10-CM | POA: Diagnosis not present

## 2015-09-09 DIAGNOSIS — Z794 Long term (current) use of insulin: Secondary | ICD-10-CM

## 2015-09-09 DIAGNOSIS — E875 Hyperkalemia: Secondary | ICD-10-CM | POA: Diagnosis not present

## 2015-09-09 DIAGNOSIS — E119 Type 2 diabetes mellitus without complications: Secondary | ICD-10-CM

## 2015-09-09 DIAGNOSIS — E118 Type 2 diabetes mellitus with unspecified complications: Secondary | ICD-10-CM

## 2015-09-09 MED ORDER — INSULIN GLARGINE 100 UNIT/ML SOLOSTAR PEN: PEN_INJECTOR | SUBCUTANEOUS | Status: DC

## 2015-09-09 NOTE — Patient Instructions (Signed)
You are doing a fantastic job with your blood pressure and diabetes!  You may continue taking your insulin as 13 units and 10 units. If you are getting too low again please adjust your dose down again or call our clinic for advice.  I would like to check your blood work one more time after starting losartan for high blood pressure. We can otherwise plan to see you in about 2 months for a Hgb A1c check.

## 2015-09-10 LAB — BMP8+ANION GAP
Anion Gap: 21 mmol/L — ABNORMAL HIGH (ref 10.0–18.0)
BUN / CREAT RATIO: 15 (ref 10–24)
BUN: 41 mg/dL — ABNORMAL HIGH (ref 8–27)
CHLORIDE: 102 mmol/L (ref 96–106)
CO2: 17 mmol/L — AB (ref 18–29)
CREATININE: 2.67 mg/dL — AB (ref 0.76–1.27)
Calcium: 10 mg/dL (ref 8.6–10.2)
GFR calc non Af Amer: 24 mL/min/{1.73_m2} — ABNORMAL LOW (ref >59)
GFR, EST AFRICAN AMERICAN: 28 mL/min/{1.73_m2} — AB (ref >59)
GLUCOSE: 125 mg/dL — AB (ref 65–99)
Potassium: 6.4 mmol/L — ABNORMAL HIGH (ref 3.5–5.2)
SODIUM: 140 mmol/L (ref 134–144)

## 2015-09-10 NOTE — Progress Notes (Signed)
Subjective:   Patient ID: Dustin Burns male   DOB: 13-May-1951 65 y.o.   MRN: [id number]  HPI: [name] is a 65 y.o. man with PMHx detailed below presenting for follow up of his diabetes and hypertension. He was noted to have slightly worse diabetes control earlier this month and increased his lantus insulin dose by 2 units daily. He had one episode of symptomatic hypoglycemia with diaphoresis that resolved after eating a piece of chocolate and an orange. He subsequently decreased his insulin dose by 1 unit and did not have additional episodes.  See problem based assessment and plan below for additional details.  Past Medical History  Diagnosis Date  . Dyslipidemia   . Type 2 diabetes mellitus ([place])   . Meralgia paresthetica   . Diabetic peripheral neuropathy ([place])   . Hypertension   . Acute gastritis     hx of  . Lumbar back pain   . Tubular adenoma of rectum 06/20/2010   Current Outpatient Prescriptions  Medication Sig Dispense Refill  . amLODipine (NORVASC) 5 MG tablet Take 1 tablet (5 mg total) by mouth daily. 30 tablet 12  . glucose blood (ONETOUCH VERIO) test strip usar 3 tiempos del dia antes de comida dx code E11.9 insulin requiring 100 each 5  . hydrochlorothiazide (HYDRODIURIL) 25 MG tablet Take 1 tablet (25 mg total) by mouth daily. 90 tablet 3  . hydrocortisone cream 1 % APPLY TO AFFECTED AREA(S) TWICE DAILY 15 g 0  . Insulin Glargine (LANTUS) 100 UNIT/ML Solostar Pen Take 13 units in the morning and 10 units in evening 9 mL 3  . Insulin Pen Needle (RELION MINI PEN NEEDLES) 31G X 6 MM MISC Use to inject Lantus insulin once daily 100 each 6  . loratadine (CLARITIN) 10 MG tablet Take 1 tablet (10 mg total) by mouth daily. 30 tablet 3  . losartan (COZAAR) 25 MG tablet Take 1 tablet (25 mg total) by mouth daily. 30 tablet 1  . metFORMIN (GLUCOPHAGE) 1000 MG tablet Take 1 tablet (1,000 mg total) by mouth 2 (two) times daily with a meal. 180 tablet 4  . ONETOUCH DELICA  LANCETS FINE MISC 1 each by Does not apply route 3 (three) times daily before meals. 99 each 6   No current facility-administered medications for this visit.   Family History  Problem Relation Age of Onset  . Diabetes Father   . Diabetes Brother    Social History   Social History  . Marital Status: Single    Spouse Name: N/A  . Number of Children: 5  . Years of Education: N/A   Occupational History  . Unemployed    Social History Main Topics  . Smoking status: Former Smoker    Quit date: 07/25/2006  . Smokeless tobacco: Never Used  . Alcohol Use: No  . Drug Use: No  . Sexual Activity: Not Asked   Other Topics Concern  . None   Social History Narrative    Patient used to work for [employer], denies any alcohol smoking or illicit drug use.   Review of Systems: Review of Systems  Constitutional: Positive for diaphoresis. Negative for fever and chills.  Eyes: Negative for blurred vision.  Respiratory: Negative for shortness of breath.   Cardiovascular: Negative for chest pain.  Gastrointestinal: Negative for abdominal pain.  Neurological: Negative for dizziness and headaches.    Objective:  Physical Exam: Filed Vitals:   09/09/15 1543  BP: 113/73  Pulse: 77  Temp: 98.3  F (36.8 C)  TempSrc: Oral  Height: 5\' 4"  (1.626 m)  Weight: 69.718 kg  SpO2: 100%   GENERAL- alert, co-operative, NAD CARDIAC- RRR, no murmurs, rubs or gallops. RESP- CTAB, no wheezes or crackles. EXTREMITIES- symmetric, no pedal edema. SKIN- Warm, dry, No rash or lesion. PSYCH- Normal mood and affect, appropriate thought content and speech.  Assessment & Plan:

## 2015-09-12 ENCOUNTER — Other Ambulatory Visit: Payer: Self-pay | Admitting: Internal Medicine

## 2015-09-12 DIAGNOSIS — E875 Hyperkalemia: Secondary | ICD-10-CM

## 2015-09-12 NOTE — Assessment & Plan Note (Addendum)
Assessment: Blood pressure much improved today with addition of losartan 25mg . However on Bmet checked at visit hyperkalemia to 6.4 was noted as well as significant worsening in creatinine clearance. No symptoms associated with this to date.  Plan: Patient contacted with instruction to discontinue losartan due to hyperkalemia. Continue amlodipine and HCTZ. Follow up at next visit, consider addition of not RAAS targeting treatment if blood pressure is uncontrolled.

## 2015-09-12 NOTE — Progress Notes (Signed)
Case discussed with Dr. Benjamine Mola at the time of the visit.  We reviewed the resident's history and exam and pertinent patient test results.  I agree with the assessment, diagnosis and plan of care documented in the resident's note.  We will ask Dr. Benjamine Mola to call Dustin Burns and have him come to clinic within the next 48 hours to assure hyperkalemia has improved with discontinuing the ARB.

## 2015-09-12 NOTE — Assessment & Plan Note (Signed)
Assessment: Diabetes control improved with average CBG of 134 on review of glucometer log. He does continue to have variation of AM and PM readings due to treatment on lantus only. Self adjusted insulin to 13U AM 10U PM after one episode of hypoglycemia.  Plan: Continue current treatment plan and follow up at routine time If additional hypoglycemic episodes are occuring trying to achieve good control the best alternative would be change to once daily lantus once daily aspart regimen to create a more level distribution throughout the day

## 2015-09-13 ENCOUNTER — Encounter: Payer: Self-pay | Admitting: Internal Medicine

## 2015-09-14 ENCOUNTER — Telehealth: Payer: Self-pay | Admitting: *Deleted

## 2015-09-14 ENCOUNTER — Telehealth: Payer: Self-pay | Admitting: Dietician

## 2015-09-14 NOTE — Telephone Encounter (Signed)
Made outgoing call using interpreter per Dr. Benjamine Mola to verify patient is no longer taking Losartan and for lab check. During call unclear if patient knows what medications are on his current regimen. Unsure if patient has stopped his Losartan. Instead of lab only visit we have asked him to bring all of his medication bottles that he is currently taken and scheduled an office visit. Reiterated to patient not to take Losartan.   FYI on your schedule for tomorrow.

## 2015-09-14 NOTE — Telephone Encounter (Signed)
Called patient's pharmacy as part of project trying to help patient's with a1C between 8-9% lower their blood sugars to target. [facility] pharmacist went through his list over [employer] and said Mr. [place] is refilling his medicine on time.

## 2015-09-15 ENCOUNTER — Ambulatory Visit (INDEPENDENT_AMBULATORY_CARE_PROVIDER_SITE_OTHER): Payer: Medicare Other | Admitting: Internal Medicine

## 2015-09-15 ENCOUNTER — Encounter: Payer: Self-pay | Admitting: Internal Medicine

## 2015-09-15 ENCOUNTER — Ambulatory Visit (HOSPITAL_COMMUNITY)
Admission: RE | Admit: 2015-09-15 | Discharge: 2015-09-15 | Disposition: A | Discharge status: Home / Self Care | Payer: Medicare Other | Source: Ambulatory Visit | Attending: Internal Medicine | Admitting: Internal Medicine

## 2015-09-15 VITALS — BP 142/70 | HR 76 | Temp 98.1°F | Ht 64.0 in | Wt 154.4 lb

## 2015-09-15 DIAGNOSIS — Z794 Long term (current) use of insulin: Secondary | ICD-10-CM | POA: Diagnosis not present

## 2015-09-15 DIAGNOSIS — E118 Type 2 diabetes mellitus with unspecified complications: Secondary | ICD-10-CM

## 2015-09-15 DIAGNOSIS — I498 Other specified cardiac arrhythmias: Secondary | ICD-10-CM | POA: Diagnosis not present

## 2015-09-15 DIAGNOSIS — I493 Ventricular premature depolarization: Secondary | ICD-10-CM | POA: Diagnosis not present

## 2015-09-15 DIAGNOSIS — I10 Essential (primary) hypertension: Secondary | ICD-10-CM | POA: Diagnosis present

## 2015-09-15 DIAGNOSIS — E875 Hyperkalemia: Secondary | ICD-10-CM | POA: Insufficient documentation

## 2015-09-15 DIAGNOSIS — Z79899 Other long term (current) drug therapy: Secondary | ICD-10-CM

## 2015-09-15 LAB — BASIC METABOLIC PANEL
ANION GAP: 10 (ref 5–15)
BUN: 30 mg/dL — ABNORMAL HIGH (ref 6–20)
CALCIUM: 9.7 mg/dL (ref 8.9–10.3)
CO2: 24 mmol/L (ref 22–32)
CREATININE: 1.53 mg/dL — AB (ref 0.61–1.24)
Chloride: 105 mmol/L (ref 101–111)
GFR calc Af Amer: 54 mL/min — ABNORMAL LOW (ref >60)
GFR calc non Af Amer: 47 mL/min — ABNORMAL LOW (ref >60)
GLUCOSE: 154 mg/dL — AB (ref 65–99)
Potassium: 4.9 mmol/L (ref 3.5–5.1)
Sodium: 139 mmol/L (ref 135–145)

## 2015-09-15 LAB — GLUCOSE, CAPILLARY: GLUCOSE-CAPILLARY: 137 mg/dL — AB (ref 65–99)

## 2015-09-15 MED ORDER — AMLODIPINE BESYLATE 10 MG PO TABS: 5.0000 mg | ORAL_TABLET | Freq: Every day | ORAL | Status: DC

## 2015-09-15 MED ORDER — INSULIN GLARGINE 100 UNIT/ML SOLOSTAR PEN: PEN_INJECTOR | SUBCUTANEOUS | Status: DC

## 2015-09-15 NOTE — Progress Notes (Signed)
   Subjective:    Patient ID: Dustin Burns, male    DOB: 1950-09-01, 65 y.o.   MRN: [id number]  HPI Mr. Knoblock is a 65yo man with PMHx of HTN, type 2 DM, and hyperlipidemia who presents today for follow up of his hypertension. Interview was conducted with the help of an interpreter.   HTN: BP today is 142/70. He takes Amlodipine 5 mg daily and HCTZ 25 mg daily. His Losartan was stopped last visit due to hyperkalemia, however patient reports he has still been taking it. He denies any chest pain, SOB, or palpitations.   Type 2 DM: Last A1c 8.0. He reports taking Lantus 13 units in AM and 11 units in PM and Metformin 1000 mg BID. He reports feeling that he has low blood sugar every time he goes to do household work. He states he will check his blood sugar sometimes when he feels this way but not every time. He is unable to tell me what his blood sugars are during these episodes.     Review of Systems General: Denies fever, chills, night sweats, changes in weight, changes in appetite HEENT: Denies headaches, ear pain, changes in vision, rhinorrhea, sore throat CV: See HPI Pulm: Denies cough, wheezing GI: Denies abdominal pain, nausea, vomiting, diarrhea, constipation, melena, hematochezia GU: Denies dysuria, hematuria, frequency Msk: Denies muscle cramps, joint pains Neuro: Denies weakness, numbness, tingling Skin: Denies rashes, bruising Psych: Denies depression, anxiety, hallucinations    Objective:   Physical Exam General: alert, sitting up, NAD HEENT: [place]/AT, EOMI, sclera anicteric, mucus membranes moist CV: RRR, no m/g/r Pulm: CTA bilaterally, breaths non-labored Abd: BS+, soft, non-tender Ext: warm, no peripheral edema, no ulcers present on feet. He does have small ulcer on the dorsum of his right hand near the thumb that appears to be healing. No surrounding erythema.   Neuro: alert and oriented x 3     Assessment & Plan:  Please refer to A&P documentation.

## 2015-09-15 NOTE — Assessment & Plan Note (Signed)
Last A1c 8.0. He reports increasing his evening Lantus to 11 units from 10 units. He is taking 13 units in the AM. I instructed him to continue his current dosing. He did report having some feelings of hypoglycemia, but unclear if he is actually having hypoglycemia since he did not bring in his glucometer. I instructed him to check his blood sugar whenever he has feelings of hypoglycemia and to drink a small cup of juice if his CBG is <70. He will bring in his glucometer to next visit with his PCP.

## 2015-09-15 NOTE — Assessment & Plan Note (Addendum)
K 6.3 at his last visit. His Losartan was stopped, but patient misunderstood due to language barrier and was still taking Losartan. He denies any symptoms of hyperkalemia. Checking repeat bmet today. Also did an EKG which shows normal sinus rhythm, no evidence of peaked T waves. His EKG is unchanged from his prior one. With no EKG changes, I feel comfortable sending the patient home without bmet results. I told patient I will contact him if there are any urgent lab results.

## 2015-09-15 NOTE — Addendum Note (Signed)
Addended by: Truddie Crumble on: 09/15/2015 11:37 AM   Modules accepted: Orders

## 2015-09-15 NOTE — Assessment & Plan Note (Addendum)
Unfortunately patient did not stop taking his Losartan. His BP not at goal even while he is still on Losartan. I emphasized to him that he should NOT refill his Losartan due to his hyperkalemia and worsening kidney function. I wasted his current Losartan bottle. Will increase his Amlodipine to 10 mg daily. Continue HCTZ 25 mg daily. Awaiting bmet results.

## 2015-09-15 NOTE — Progress Notes (Signed)
Internal Medicine Clinic Attending  Case discussed with Dr. Rivet at the time of the visit.  We reviewed the resident's history and exam and pertinent patient test results.  I agree with the assessment, diagnosis, and plan of care documented in the resident's note.  

## 2015-09-15 NOTE — Patient Instructions (Addendum)
General Instructions: - STOP taking Losartan. Do not refill - Start taking Amlodipine 10 mg daily - Check your blood sugar EVERY time you have feelings of low blood sugar - Follow up with Dr. Benjamine Mola in June   Thank you for bringing your medicines today. This helps [place] keep you safe from mistakes.   Progress Toward Treatment Goals:  Treatment Goal 08/09/2014  Hemoglobin A1C deteriorated  Blood pressure at goal    Self Care Goals & Plans:  Self Care Goal 09/15/2015  Manage my medications take my medicines as prescribed; bring my medications to every visit; refill my medications on time  Monitor my health keep track of my blood pressure; bring my glucose meter and log to each visit; keep track of my blood glucose  Eat healthy foods eat more vegetables; eat foods that are low in salt; eat baked foods instead of fried foods  Be physically active find an activity I enjoy  Meeting treatment goals -    Home Blood Glucose Monitoring 08/09/2014  Check my blood sugar 2 times a day  When to check my blood sugar -     Care Management & Community Referrals:  Referral 04/26/2014  Referrals made for care management support none needed

## 2015-09-19 ENCOUNTER — Other Ambulatory Visit: Payer: Self-pay | Admitting: Internal Medicine

## 2015-09-19 DIAGNOSIS — Z794 Long term (current) use of insulin: Principal | ICD-10-CM

## 2015-09-19 DIAGNOSIS — E118 Type 2 diabetes mellitus with unspecified complications: Secondary | ICD-10-CM

## 2015-10-03 ENCOUNTER — Other Ambulatory Visit: Payer: Self-pay | Admitting: Internal Medicine

## 2015-10-17 ENCOUNTER — Telehealth: Payer: Self-pay | Admitting: *Deleted

## 2015-10-17 NOTE — Telephone Encounter (Signed)
Patient states the pharmacy does not have his medication. Called [facility] and verified that they had the most recent prescription. Will have ready for patient to pick up today.

## 2015-10-28 ENCOUNTER — Encounter: Payer: Medicare Other | Admitting: Dietician

## 2015-11-01 ENCOUNTER — Encounter: Payer: Medicare Other | Admitting: Dietician

## 2015-11-07 NOTE — Addendum Note (Signed)
Addended by: Hulan Fray on: 11/07/2015 06:46 PM   Modules accepted: Orders

## 2015-11-25 ENCOUNTER — Encounter: Payer: Self-pay | Admitting: Internal Medicine

## 2015-11-25 ENCOUNTER — Ambulatory Visit (INDEPENDENT_AMBULATORY_CARE_PROVIDER_SITE_OTHER): Payer: Medicare Other | Admitting: Internal Medicine

## 2015-11-25 VITALS — BP 159/73 | HR 76 | Temp 98.2°F | Ht 64.0 in | Wt 158.8 lb

## 2015-11-25 DIAGNOSIS — I10 Essential (primary) hypertension: Secondary | ICD-10-CM

## 2015-11-25 DIAGNOSIS — Z794 Long term (current) use of insulin: Secondary | ICD-10-CM

## 2015-11-25 DIAGNOSIS — E1142 Type 2 diabetes mellitus with diabetic polyneuropathy: Secondary | ICD-10-CM

## 2015-11-25 DIAGNOSIS — E118 Type 2 diabetes mellitus with unspecified complications: Secondary | ICD-10-CM

## 2015-11-25 LAB — POCT GLYCOSYLATED HEMOGLOBIN (HGB A1C): HEMOGLOBIN A1C: 8.3

## 2015-11-25 LAB — GLUCOSE, CAPILLARY: GLUCOSE-CAPILLARY: 184 mg/dL — AB (ref 65–99)

## 2015-11-25 MED ORDER — INSULIN ASPART 100 UNIT/ML ~~LOC~~ SOLN: SUBCUTANEOUS | Status: DC

## 2015-11-25 MED ORDER — INSULIN GLARGINE 100 UNIT/ML SOLOSTAR PEN: PEN_INJECTOR | SUBCUTANEOUS | Status: DC

## 2015-11-26 LAB — BMP8+ANION GAP
Anion Gap: 21 mmol/L — ABNORMAL HIGH (ref 10.0–18.0)
BUN / CREAT RATIO: 18 (ref 10–24)
BUN: 40 mg/dL — AB (ref 8–27)
CHLORIDE: 98 mmol/L (ref 96–106)
CO2: 20 mmol/L (ref 18–29)
Calcium: 9.5 mg/dL (ref 8.6–10.2)
Creatinine, Ser: 2.19 mg/dL — ABNORMAL HIGH (ref 0.76–1.27)
GFR calc non Af Amer: 31 mL/min/{1.73_m2} — ABNORMAL LOW (ref >59)
GFR, EST AFRICAN AMERICAN: 36 mL/min/{1.73_m2} — AB (ref >59)
GLUCOSE: 163 mg/dL — AB (ref 65–99)
Potassium: 5.1 mmol/L (ref 3.5–5.2)
Sodium: 139 mmol/L (ref 134–144)

## 2015-11-28 NOTE — Assessment & Plan Note (Signed)
BP Readings from Last 3 Encounters:  11/25/15 159/73  09/15/15 142/70  09/09/15 113/73    Lab Results  Component Value Date   NA 139 11/25/2015   K 5.1 11/25/2015   CREATININE 2.19* 11/25/2015    Assessment: Blood pressure control: Moderately uncontrolled Progress toward BP goal:  Worsened Comments: He is now on only amlodipine 5mg  and HCTZ 25mg  daily, as he did not tolerate even a low dose ARB due to hyperkalemia. He was actually recommended to increase his amlodipine at the last clinic appointment but did not do this yet. He also has some bilateral calf and hamstring cramping that could be consistent with hypokalemia, but this is not present on BMP today with a K of 5.1.  Plan: Medications:  Instructed him to increase amlodipine to 10mg  Other plans: Checked basic metabolic panel for monitoring.

## 2015-11-28 NOTE — Assessment & Plan Note (Signed)
Lab Results  Component Value Date   HGBA1C 8.3 11/25/2015   HGBA1C 8.0 08/15/2015   HGBA1C 7.4 04/01/2015     Assessment: Diabetes control: Moderately uncontrolled Progress toward A1C goal:  Worsening Comments: Dustin Burns has very diligently complied with his diabetes treatments with BID lantus and metformin but has had some worsening of glycemic control over the past year. Attempts to titrate to any higher doses cause symptomatic hypoglycemia since he is consistently normal in the AM and hyperglycemic in the PM.  Plan: Medications:  Continue metformin 1g BID, change lantus insulin from 13 and 11 units to 15 units once daily plus novolog insulin before dinner titrating from 3, 4, to 5 units if tolerating Home glucose monitoring: Frequency: 2-3 times daily Timing: In morning and before dinner with insulin Instruction/counseling given: reminded to bring blood glucose meter & log to each visit Other plans: I am recommending he change from BID lantus to daily plus prandial insulin once daily before his largest meal. He cannot tolerate more lowering with a long acting treatment since his AM glucose will become too low. I spent about 30 minutes discussing glycemic control, this change in plan, looking out for hypoglycemia, and the importance of close return to clinic within about 2 weeks. Sample novolog pen was provided today along with this instruction.

## 2015-11-28 NOTE — Progress Notes (Signed)
   CC: Follow up for diabetes  HPI:  Dustin Burns is a 65 y.o. male with PMHx detailed below presenting for a 3 month follow up for his type 2 diabetes.  See problem based assessment and plan below for additional details.  Past Medical History  Diagnosis Date  . Dyslipidemia   . Type 2 diabetes mellitus ([place])   . Meralgia paresthetica   . Diabetic peripheral neuropathy ([place])   . Hypertension   . Acute gastritis     hx of  . Lumbar back pain   . Tubular adenoma of rectum 06/20/2010    Review of Systems: Review of Systems  Constitutional: Negative for malaise/fatigue.  Eyes: Negative for blurred vision.  Cardiovascular: Negative for leg swelling.  Genitourinary: Negative for urgency and frequency.  Musculoskeletal: Positive for myalgias.  Neurological: Negative for sensory change and headaches.     Physical Exam: Filed Vitals:   11/25/15 1513  BP: 159/73  Pulse: 76  Temp: 98.2 F (36.8 C)  TempSrc: Oral  Height: 5\' 4"  (1.626 m)  Weight: 158 lb 12.8 oz (72.031 kg)  SpO2: 100%   GENERAL- alert, co-operative, NAD CARDIAC- RRR, no murmurs RESP- CTAB, no wheezes or crackles EXTREMITIES- symmetric, no pedal edema, no tenderness to palpation SKIN- Warm, dry, No rash or lesion. PSYCH- Normal mood and affect, appropriate thought content and speech.  Filed Vitals:   11/25/15 1513  BP: 159/73  Pulse: 76  Temp: 98.2 F (36.8 C)  TempSrc: Oral  Height: 5\' 4"  (1.626 m)  Weight: 158 lb 12.8 oz (72.031 kg)  SpO2: 100%     Assessment & Plan:   See encounters tab for problem based medical decision making.   Patient discussed with Dr. Dareen Piano

## 2015-11-29 NOTE — Progress Notes (Signed)
Internal Medicine Clinic Attending  Case discussed with Dr. Rice at the time of the visit.  We reviewed the resident's history and exam and pertinent patient test results.  I agree with the assessment, diagnosis, and plan of care documented in the resident's note.  

## 2015-12-02 ENCOUNTER — Telehealth: Payer: Self-pay | Admitting: *Deleted

## 2015-12-02 DIAGNOSIS — Z794 Long term (current) use of insulin: Principal | ICD-10-CM

## 2015-12-02 DIAGNOSIS — E118 Type 2 diabetes mellitus with unspecified complications: Secondary | ICD-10-CM

## 2015-12-02 NOTE — Telephone Encounter (Signed)
Received fax from pt's pharmacy that novolog is non-preferred and Humalog is the preferred.  Will forward info to pt's pcp for review and medication change if appropriate. Please advise.[name], [name]7/21/201710:50 AM   Of note, pt has not picked up the novolog yet-cost is over $300

## 2015-12-03 MED ORDER — INSULIN LISPRO 100 UNIT/ML (KWIKPEN): 5.0000 [IU] | PEN_INJECTOR | Freq: Every day | SUBCUTANEOUS | Status: DC

## 2015-12-03 NOTE — Telephone Encounter (Signed)
He should be fine without the medication for a while since a sample pen was provided in clinic one week ago with at least 20 days worth of doses. He is following up in clinic next week. I will go ahead and change the prescription so that his insurance covers it appropriately but it might need to be adjusted at his upcoming visit anyways.

## 2015-12-07 ENCOUNTER — Ambulatory Visit (INDEPENDENT_AMBULATORY_CARE_PROVIDER_SITE_OTHER): Payer: Medicare Other | Admitting: Internal Medicine

## 2015-12-07 ENCOUNTER — Encounter: Payer: Self-pay | Admitting: Internal Medicine

## 2015-12-07 DIAGNOSIS — Z794 Long term (current) use of insulin: Secondary | ICD-10-CM

## 2015-12-07 DIAGNOSIS — E118 Type 2 diabetes mellitus with unspecified complications: Secondary | ICD-10-CM | POA: Diagnosis present

## 2015-12-07 DIAGNOSIS — I10 Essential (primary) hypertension: Secondary | ICD-10-CM | POA: Diagnosis not present

## 2015-12-07 MED ORDER — INSULIN GLARGINE 100 UNIT/ML SOLOSTAR PEN: PEN_INJECTOR | SUBCUTANEOUS | 3 refills | Status: DC

## 2015-12-07 NOTE — Progress Notes (Signed)
Internal Medicine Clinic Attending  Case discussed with Dr. Wallace at the time of the visit.  We reviewed the resident's history and exam and pertinent patient test results.  I agree with the assessment, diagnosis, and plan of care documented in the resident's note.  

## 2015-12-07 NOTE — Patient Instructions (Signed)
Thank you for coming to see me today. It was a pleasure. Today we talked about:   Diabetes: - I have discontinued your Novolog. - I have increased your Lantus back to twice per day dosing. - Please make sure to watch your diet in the evening time to help lower your blood sugars. - No other changes.  Please follow-up with your primary doctor in 4 weeks.  If you have any questions or concerns, please do not hesitate to call the office at [phone].  Take Care,   Jule Ser, DO

## 2015-12-07 NOTE — Assessment & Plan Note (Signed)
BP Readings from Last 3 Encounters:  12/07/15 139/79  11/25/15 (!) 159/73  09/15/15 (!) 142/70   A:  His BP today is improved and he reports adherence to the recommended Amlodipine 10mg  daily in addition to the HCTZ 25mg  daily.    P: - continue current medications of amlodipine 10mg  daily and HCTZ 25mg  daily. - RTC in 4 weeks with PCP.

## 2015-12-07 NOTE — Assessment & Plan Note (Signed)
Lab Results  Component Value Date   HGBA1C 8.3 11/25/2015   A:  At his last visit 11/25/2015 with his PCP, his insulin was adjusted.  He was previously taking Lantus 13 units QAM and 11units QPM.  This was changed to 15 units once daily plus the addition of Novolog 5 units before dinner to address his elevated blood sugars in the evening.  He states he did this for about 4 days and then stopped, saying it was not helping his blood sugars and that he did not want to be uncontrolled.  He also states the Novolog caused his eyes to burn.  He has essentially been doing his old regimen since that time.  I discussed with patient the thinking behind his recent insulin changes and that I was unaware of this side effect from Novolog.  In addition, due to cost he was going to be given Humalog in the future.  Despite this, he was adamant about not taking Novolog or Humalog and that he did not want to use this medication.  He much prefers the BID dosing of Lantus.   He brought his meter in today, which shows good glycemic control in the morning with many readings <100 and the rest essentially < 150.  He continues to be hyperglycemic in the evening with many readings greater than 200.  He has good understanding of needing to follow a better diabetic diet to control his evening blood sugars.  P: - I have basically resumed his old insulin regimen as this is what he has been doing and he will not take fast-acting insulin - Discontinued Novolog/Humalog - Continue Lantus 13 units in the AM, 11 units in the evening. - In the future, may consider other BID insulin dosing options such as 70/30. - May also consider something such as Prandin to be given with his largest meal of the day or as needed if he has a lot of sweets. - RTC 4 weeks for visit with PCP

## 2015-12-07 NOTE — Progress Notes (Signed)
CC: here for follow up DM  HPI:  [name] is a 65 y.o. man with PMHx as below who presents today for follow up of his DM and HTN.  Please see A&P for further details of today's visit.  Past Medical History:  Diagnosis Date  . Acute gastritis    hx of  . Diabetic peripheral neuropathy ([place])   . Dyslipidemia   . Hypertension   . Lumbar back pain   . Meralgia paresthetica   . Tubular adenoma of rectum 06/20/2010  . Type 2 diabetes mellitus ([place])     Review of Systems:   Review of Systems  Constitutional: Negative for chills and fever.  HENT:       Burning in eyes with Novolog  Respiratory: Negative for shortness of breath.   Cardiovascular: Negative for chest pain.     Physical Exam:  Vitals:   12/07/15 0933  BP: 139/79  Pulse: 77  Temp: 97.9 F (36.6 C)  TempSrc: Oral  SpO2: 100%  Weight: 158 lb (71.7 kg)  Height: 5\' 4"  (1.626 m)   Physical Exam  Constitutional: He is oriented to person, place, and time and well-developed, well-nourished, and in no distress.  Pleasant, Spanish-speaking man  HENT:  Head: Normocephalic and atraumatic.  Eyes: Conjunctivae and EOM are normal.  Neurological: He is alert and oriented to person, place, and time.  Skin: Skin is warm and dry.  Psychiatric: Mood and affect normal.    Assessment & Plan:   See Encounters Tab for problem based charting.   Patient discussed with Dr. Evette Doffing  Essential hypertension, benign BP Readings from Last 3 Encounters:  12/07/15 139/79  11/25/15 (!) 159/73  09/15/15 (!) 142/70   A:  His BP today is improved and he reports adherence to the recommended Amlodipine 10mg  daily in addition to the HCTZ 25mg  daily.    P: - continue current medications of amlodipine 10mg  daily and HCTZ 25mg  daily. - RTC in 4 weeks with PCP.  Diabetes mellitus type 2 with complications [facility]) Lab Results  Component Value Date   HGBA1C 8.3 11/25/2015   A:  At his last visit 11/25/2015 with his PCP,  his insulin was adjusted.  He was previously taking Lantus 13 units QAM and 11units QPM.  This was changed to 15 units once daily plus the addition of Novolog 5 units before dinner to address his elevated blood sugars in the evening.  He states he did this for about 4 days and then stopped, saying it was not helping his blood sugars and that he did not want to be uncontrolled.  He also states the Novolog caused his eyes to burn.  He has essentially been doing his old regimen since that time.  I discussed with patient the thinking behind his recent insulin changes and that I was unaware of this side effect from Novolog.  In addition, due to cost he was going to be given Humalog in the future.  Despite this, he was adamant about not taking Novolog or Humalog and that he did not want to use this medication.  He much prefers the BID dosing of Lantus.   He brought his meter in today, which shows good glycemic control in the morning with many readings <100 and the rest essentially < 150.  He continues to be hyperglycemic in the evening with many readings greater than 200.  He has good understanding of needing to follow a better diabetic diet to control his evening blood sugars.  P: - I have basically resumed his old insulin regimen as this is what he has been doing and he will not take fast-acting insulin - Discontinued Novolog/Humalog - Continue Lantus 13 units in the AM, 11 units in the evening. - In the future, may consider other BID insulin dosing options such as 70/30. - May also consider something such as Prandin to be given with his largest meal of the day or as needed if he has a lot of sweets. - RTC 4 weeks for visit with PCP

## 2016-01-05 ENCOUNTER — Telehealth: Payer: Self-pay | Admitting: Internal Medicine

## 2016-01-05 NOTE — Telephone Encounter (Signed)
APT. REMINDER CALL, LMTCB °

## 2016-01-06 ENCOUNTER — Encounter: Payer: Self-pay | Admitting: Internal Medicine

## 2016-01-06 ENCOUNTER — Ambulatory Visit (INDEPENDENT_AMBULATORY_CARE_PROVIDER_SITE_OTHER): Payer: Medicare Other | Admitting: Internal Medicine

## 2016-01-06 DIAGNOSIS — Z87891 Personal history of nicotine dependence: Secondary | ICD-10-CM | POA: Diagnosis not present

## 2016-01-06 DIAGNOSIS — M79605 Pain in left leg: Secondary | ICD-10-CM | POA: Diagnosis not present

## 2016-01-06 DIAGNOSIS — I10 Essential (primary) hypertension: Secondary | ICD-10-CM

## 2016-01-06 DIAGNOSIS — E1165 Type 2 diabetes mellitus with hyperglycemia: Secondary | ICD-10-CM

## 2016-01-06 DIAGNOSIS — Z794 Long term (current) use of insulin: Secondary | ICD-10-CM | POA: Diagnosis not present

## 2016-01-06 DIAGNOSIS — E118 Type 2 diabetes mellitus with unspecified complications: Secondary | ICD-10-CM

## 2016-01-06 DIAGNOSIS — M79604 Pain in right leg: Secondary | ICD-10-CM | POA: Diagnosis not present

## 2016-01-06 MED ORDER — AMLODIPINE BESYLATE 10 MG PO TABS: 10.0000 mg | ORAL_TABLET | Freq: Every day | ORAL | 11 refills | Status: DC

## 2016-01-09 DIAGNOSIS — M79604 Pain in right leg: Secondary | ICD-10-CM | POA: Insufficient documentation

## 2016-01-09 DIAGNOSIS — M79605 Pain in left leg: Secondary | ICD-10-CM

## 2016-01-09 NOTE — Progress Notes (Signed)
   CC: Follow up for diabetes and leg pain  HPI:  [name] is a 65 y.o. man with medical history detailed below presenting for follow up of his diabetes and also ongoing leg pain. He had very poor success trying to change off his routine of BID lantus insulin and metformin earlier this year so is back to this previous regimen. He denies any more complications since resuming this. He also has bilateral leg pain that is ongoing for several months now. It is worst first standing up out of bed in the morning, painful along he backs of his calves and thighs. This pain improves with walking around. He is not taking anything for it. There is no associated numbness or weakness.  See problem based assessment and plan for additional details.  Past Medical History:  Diagnosis Date  . Acute gastritis    hx of  . Diabetic peripheral neuropathy ([place])   . Dyslipidemia   . Hypertension   . Lumbar back pain   . Meralgia paresthetica   . Tubular adenoma of rectum 06/20/2010  . Type 2 diabetes mellitus ([place])     Review of Systems:  Review of Systems  Eyes: Negative for blurred vision.  Cardiovascular: Negative for leg swelling.  Genitourinary: Negative for frequency.  Musculoskeletal: Negative for back pain and joint pain.  Skin: Negative for itching.  Neurological: Negative for tingling, sensory change, focal weakness and weakness.    Physical Exam:  Vitals:   01/06/16 1506  BP: (!) 143/68  Pulse: 72  Temp: 97.8 F (36.6 C)  TempSrc: Oral  SpO2: 100%  Weight: 159 lb 14.4 oz (72.5 kg)  Height: 5\' 4"  (1.626 m)   GENERAL- alert, co-operative, NAD CARDIAC- RRR, no murmurs, rubs or gallops. RESP- CTAB, no wheezes or crackles. BACK- Normal curvature, no paraspinal tenderness, no CVA tenderness. NEURO- Strength upper and lower extremities- 5/5, Sensation intact globally EXTREMITIES- pulse 2+, symmetric, no pedal edema, straight leg raise negative, mild calf pain on foot  dorsiflexion SKIN- Warm, dry, No rash or lesion. PSYCH- Normal mood and affect, appropriate thought content and speech.    Assessment & Plan:   See Encounters Tab for problem based charting.  Patient discussed with Dr. Daryll Drown

## 2016-01-09 NOTE — Assessment & Plan Note (Signed)
BP Readings from Last 3 Encounters:  01/06/16 (!) 143/68  12/07/15 139/79  11/25/15 (!) 159/73    Lab Results  Component Value Date   NA 139 11/25/2015   K 5.1 11/25/2015   CREATININE 2.19 (H) 11/25/2015     Assessment: Blood pressure control: Mildly uncontrolled Progress toward BP goal:  Worse Comments: He has bit slightly uncontrolled now since not being able to tolerate an ACE-I due to hyperkalemia. He had some progress with on amlodipine 10mg  and HCTZ 25mg .  Plan: He is still not greatly controlled and with his degree of proteinuria and nephropathy will need to consider changing amlodipine to non-dihydropyridine CCB on next occasion.

## 2016-01-09 NOTE — Assessment & Plan Note (Signed)
A: His symptoms much improved now after resuming his old lantus BID dosing and metformin. Unfortunately his diabetes control has not been adequate. Glucometer review shows again he is at goal in mornings and hyperglycemic in evenings. He is very resistant to any other changes of his medications at this time.  P: Continue lantus 13U qAM 11U qPM Can consider additional oral agents in another visit or two when he is feeling well

## 2016-01-09 NOTE — Assessment & Plan Note (Addendum)
He is having intermittent pain along the backs of his legs that is worst first thing in the morning and improving once up and about. On physical exam there is mild tenderness to hard palpation and when putting the foot or hip into full flexion. His range of motion is good and he reports regular exercise without difficulty. There is no lower back pain or paraspinal tenderness. Straight leg raise is fine going up to 90 degrees bilaterally. It is possible this is a neuropathic process but without foot involvement, obvious sensory changes, and improving after loosening up seems less likely for now.  The pain seems likely consistent with muscle cramping based on description and timing. Nothing on metabolic panels suggests an obvious cause for this. I will recommend him daily stretching and demonstrated these today.

## 2016-01-12 NOTE — Progress Notes (Signed)
Internal Medicine Clinic Attending  Case discussed with Dr. Benjamine Mola soon after the resident saw the patient.  We reviewed the resident's history and exam and pertinent patient test results.  I agree with the assessment, diagnosis, and plan of care documented in the resident's note.  Though symptom onset not classic, would also consider claudication as patient has uncontrolled diabetes in the differential of his leg cramping.  If not improving with conservative therapy, consider ABI.

## 2016-03-23 ENCOUNTER — Ambulatory Visit (INDEPENDENT_AMBULATORY_CARE_PROVIDER_SITE_OTHER): Payer: Medicare Other | Admitting: Internal Medicine

## 2016-03-23 ENCOUNTER — Encounter: Payer: Self-pay | Admitting: Internal Medicine

## 2016-03-23 VITALS — BP 144/70 | HR 80 | Temp 97.6°F | Ht 64.0 in | Wt 154.3 lb

## 2016-03-23 DIAGNOSIS — J3089 Other allergic rhinitis: Secondary | ICD-10-CM | POA: Diagnosis not present

## 2016-03-23 DIAGNOSIS — K0889 Other specified disorders of teeth and supporting structures: Secondary | ICD-10-CM | POA: Diagnosis not present

## 2016-03-23 DIAGNOSIS — Z634 Disappearance and death of family member: Secondary | ICD-10-CM

## 2016-03-23 DIAGNOSIS — Z87891 Personal history of nicotine dependence: Secondary | ICD-10-CM | POA: Diagnosis not present

## 2016-03-23 DIAGNOSIS — E118 Type 2 diabetes mellitus with unspecified complications: Secondary | ICD-10-CM | POA: Diagnosis not present

## 2016-03-23 DIAGNOSIS — Z8673 Personal history of transient ischemic attack (TIA), and cerebral infarction without residual deficits: Secondary | ICD-10-CM | POA: Diagnosis not present

## 2016-03-23 DIAGNOSIS — Z794 Long term (current) use of insulin: Secondary | ICD-10-CM | POA: Diagnosis not present

## 2016-03-23 DIAGNOSIS — Z596 Low income: Secondary | ICD-10-CM

## 2016-03-23 DIAGNOSIS — I10 Essential (primary) hypertension: Secondary | ICD-10-CM | POA: Diagnosis not present

## 2016-03-23 DIAGNOSIS — E1121 Type 2 diabetes mellitus with diabetic nephropathy: Secondary | ICD-10-CM

## 2016-03-23 DIAGNOSIS — E1142 Type 2 diabetes mellitus with diabetic polyneuropathy: Secondary | ICD-10-CM | POA: Diagnosis not present

## 2016-03-23 DIAGNOSIS — N183 Chronic kidney disease, stage 3 unspecified: Secondary | ICD-10-CM

## 2016-03-23 DIAGNOSIS — Z0000 Encounter for general adult medical examination without abnormal findings: Secondary | ICD-10-CM

## 2016-03-23 LAB — POCT GLYCOSYLATED HEMOGLOBIN (HGB A1C): HEMOGLOBIN A1C: 7.1

## 2016-03-23 LAB — GLUCOSE, CAPILLARY: GLUCOSE-CAPILLARY: 283 mg/dL — AB (ref 65–99)

## 2016-03-23 MED ORDER — LORATADINE 10 MG PO TABS: 10.0000 mg | ORAL_TABLET | Freq: Every day | ORAL | 6 refills | Status: DC

## 2016-03-23 MED ORDER — HYDROCHLOROTHIAZIDE 25 MG PO TABS: 25.0000 mg | ORAL_TABLET | Freq: Every day | ORAL | 6 refills | Status: DC

## 2016-03-23 MED ORDER — PRAVASTATIN SODIUM 40 MG PO TABS: 40.0000 mg | ORAL_TABLET | Freq: Every evening | ORAL | 3 refills | Status: DC

## 2016-03-23 NOTE — Progress Notes (Signed)
   CC: Follow-up for diabetes  HPI:  [name] is a 65 y.o. man with a medical history detailed below significant for hypertension and type 2 diabetes complicated by peripheral neuropathy and chronic nephropathy. His leg pain entirely improved within weeks from our last visit. He is however feeling some mouth pain since a week or two ago he attributes to his poor dentition. He has not pursued treatment for this due to lack of dental insurance and finances. However his wife did pass away recently in September after suffering from a stroke. Since that time he is living by himself.  See problem based assessment and plan below for additional details  Past Medical History:  Diagnosis Date  . Acute gastritis    hx of  . Diabetic peripheral neuropathy ([place])   . Dyslipidemia   . Hypertension   . Lumbar back pain   . Meralgia paresthetica   . Tubular adenoma of rectum 06/20/2010  . Type 2 diabetes mellitus ([place])     Review of Systems:  Review of Systems  HENT: Negative for congestion.   Eyes: Negative for blurred vision.  Respiratory: Negative for shortness of breath.   Cardiovascular: Negative for chest pain and leg swelling.  Gastrointestinal: Negative for diarrhea.  Genitourinary: Negative for frequency.  Musculoskeletal: Negative for myalgias.  Neurological: Negative for sensory change and headaches.  Endo/Heme/Allergies: Negative for polydipsia.  Psychiatric/Behavioral: Negative for depression.    Physical Exam:  Vitals:   03/23/16 1453  BP: (!) 144/70  Pulse: 80  Temp: 97.6 F (36.4 C)  TempSrc: Oral  SpO2: 100%  Weight: 154 lb 4.8 oz (70 kg)  Height: 5\' 4"  (1.626 m)   GENERAL- alert, co-operative, NAD HEENT- Poor dentition with several cracked teeth, caries, and exposed dental pulp on at least one tooth on left mandible CARDIAC- RRR, no murmurs RESP- CTAB, no wheezes or crackles NEURO-  strength upper and lower extremities- 5/5, Sensation intact  globally EXTREMITIES- no pedal edema, no right leg tenderness, weakness, or pain on ROM, he did have slight tenderness around his right gluteus where he wears his wallet SKIN- Warm, dry, No rash or lesion PSYCH- Normal mood and affect, appropriate thought content and speech    Assessment & Plan:   See Encounters Tab for problem based charting.  Patient discussed with Dr. Daryll Drown

## 2016-03-24 LAB — BMP8+ANION GAP
Anion Gap: 19 mmol/L — ABNORMAL HIGH (ref 10.0–18.0)
BUN/Creatinine Ratio: 16 (ref 10–24)
BUN: 36 mg/dL — ABNORMAL HIGH (ref 8–27)
CO2: 21 mmol/L (ref 18–29)
Calcium: 9.6 mg/dL (ref 8.6–10.2)
Chloride: 97 mmol/L (ref 96–106)
Creatinine, Ser: 2.31 mg/dL — ABNORMAL HIGH (ref 0.76–1.27)
GFR calc Af Amer: 33 mL/min/1.73 — ABNORMAL LOW
GFR calc non Af Amer: 29 mL/min/1.73 — ABNORMAL LOW
Glucose: 241 mg/dL — ABNORMAL HIGH (ref 65–99)
Potassium: 4.9 mmol/L (ref 3.5–5.2)
Sodium: 137 mmol/L (ref 134–144)

## 2016-03-26 DIAGNOSIS — N184 Chronic kidney disease, stage 4 (severe): Secondary | ICD-10-CM | POA: Insufficient documentation

## 2016-03-26 NOTE — Assessment & Plan Note (Signed)
BP Readings from Last 3 Encounters:  03/23/16 (!) 144/70  01/06/16 (!) 143/68  12/07/15 139/79    Lab Results  Component Value Date   NA 137 03/23/2016   K 4.9 03/23/2016   CREATININE 2.31 (H) 03/23/2016    Assessment: Blood pressure control: Mildly uncontrolled hypertension Progress toward BP goal:  No improvement Comments: He remains mildly uncontrolled with progressive worsening renal disease over the past few months. He is very compliant with his medications amlodipine and HCTZ but extremely resistant to changes. Unfortunately he has been unable to tolerate ACE-I therapy due to hyperkalemia on even minimal doses. I spent quite a while counseling him on other changes today so I will need to meet again and discuss blood pressure.  Plan: Need to see in clinic again soon to discuss a change in antihypertensives probably amlodipine and may need a 3rd agent since he has progression of his nephropathy that seems to be from his uncontrolled HTN and diabetes.

## 2016-03-26 NOTE — Assessment & Plan Note (Addendum)
Lab Results  Component Value Date   HGBA1C 7.1 03/23/2016   HGBA1C 8.3 11/25/2015   HGBA1C 8.0 08/15/2015     Assessment: Diabetes control: More controlled Progress toward A1C goal:  Improving Comments: Dustin Burns is a very good improvement in his blood sugars at this visit compared to before. He still struggles with some daytime variation sugars due to only taking basal insulin. He is still extremely resistant to medication changes. He is currently compliant with metformin and his Lantus at 13 units every morning and 11 units every afternoon.  Plan: Medications:  continue current medications Home glucose monitoring:  Frequency: 2-3 times daily Timing: Mealtimes Instruction/counseling given: reminded to get eye exam and reminded to bring blood glucose meter & log to each visit Educational resources provided:   Self management tools provided: Copy of glucometer log provided and discussed  Given his ongoing problems with hypertension and diabetes that are both mildly uncontrolled and a previous history of TIA I discussed at length the benefits of statin therapy for primary prevention. He was previously on Pravachol until 2015 when it was discontinued without documentation of the specific reason. Dustin Burns also does not recall details about this medicine. After discussion he was amenable to trying this medicine and agreed that he would discontinue it and contact us if he has trouble taking it, and I specifically addressed the possibility of GI or myopathy as adverse effects.

## 2016-03-26 NOTE — Assessment & Plan Note (Signed)
Continues to be an intermittent problem. It does not generally fit an allergic rhinitis to be bothering him at this time, but he reports good improvement with loratadine. I will recommend continuing 10mg  daily as needed for these symptoms.

## 2016-03-27 NOTE — Progress Notes (Signed)
Internal Medicine Clinic Attending  Case discussed with Dr. Rice soon after the resident saw the patient.  We reviewed the resident's history and exam and pertinent patient test results.  I agree with the assessment, diagnosis, and plan of care documented in the resident's note. 

## 2016-04-25 ENCOUNTER — Telehealth: Payer: Self-pay | Admitting: Internal Medicine

## 2016-04-25 NOTE — Telephone Encounter (Signed)
APT. REMINDER CALL, LMTCB °

## 2016-04-26 ENCOUNTER — Encounter: Payer: Self-pay | Admitting: Internal Medicine

## 2016-04-26 ENCOUNTER — Encounter (INDEPENDENT_AMBULATORY_CARE_PROVIDER_SITE_OTHER): Payer: Self-pay

## 2016-04-26 ENCOUNTER — Ambulatory Visit (INDEPENDENT_AMBULATORY_CARE_PROVIDER_SITE_OTHER): Payer: Medicare Other | Admitting: Internal Medicine

## 2016-04-26 VITALS — BP 141/71 | HR 83 | Temp 97.7°F | Wt 154.3 lb

## 2016-04-26 DIAGNOSIS — Z794 Long term (current) use of insulin: Secondary | ICD-10-CM | POA: Diagnosis not present

## 2016-04-26 DIAGNOSIS — I10 Essential (primary) hypertension: Secondary | ICD-10-CM

## 2016-04-26 DIAGNOSIS — I129 Hypertensive chronic kidney disease with stage 1 through stage 4 chronic kidney disease, or unspecified chronic kidney disease: Secondary | ICD-10-CM | POA: Diagnosis not present

## 2016-04-26 DIAGNOSIS — Z87891 Personal history of nicotine dependence: Secondary | ICD-10-CM

## 2016-04-26 DIAGNOSIS — N183 Chronic kidney disease, stage 3 unspecified: Secondary | ICD-10-CM

## 2016-04-26 DIAGNOSIS — E118 Type 2 diabetes mellitus with unspecified complications: Secondary | ICD-10-CM

## 2016-04-26 DIAGNOSIS — E1165 Type 2 diabetes mellitus with hyperglycemia: Secondary | ICD-10-CM

## 2016-04-26 DIAGNOSIS — Z79899 Other long term (current) drug therapy: Secondary | ICD-10-CM | POA: Diagnosis not present

## 2016-04-26 DIAGNOSIS — E1122 Type 2 diabetes mellitus with diabetic chronic kidney disease: Secondary | ICD-10-CM

## 2016-04-26 MED ORDER — INSULIN GLARGINE 100 UNIT/ML SOLOSTAR PEN: PEN_INJECTOR | SUBCUTANEOUS | 3 refills | Status: DC

## 2016-04-26 MED ORDER — SITAGLIPTIN PHOSPHATE 25 MG PO TABS: 25.0000 mg | ORAL_TABLET | Freq: Every day | ORAL | 4 refills | Status: DC

## 2016-04-26 NOTE — Assessment & Plan Note (Addendum)
Assessment: Patient's blood pressure today is 141/71. Goal is less than 140/90.  Plan: Discussed weight loss, diet, and exercise. He will need to weigh 145 pounds to have a BMI of 24.9. Continue hydrochlorothiazide 25 mg.

## 2016-04-26 NOTE — Assessment & Plan Note (Signed)
Assessment: Patient's most recent GFR was 29. We'll make medication adjustments.  Plan: Discontinue metformin 1000 mg twice a day started on Januvia 25 mg once daily. Referral placed for nephrology.

## 2016-04-26 NOTE — Patient Instructions (Addendum)
For your diabetes take:  1. januvia 25mg  once a day 2. Lantus insulin 13 units in the morning and 11 units in the evening.

## 2016-04-26 NOTE — Assessment & Plan Note (Signed)
Assessment: The patient's most recent A1c was 7.1. His metformin is being discontinued secondary to his GFR. Glucometer ranges from 84-356. He does not want to change his current insulin regimen of Lantus 13 in the morning and 11.   Plan: Start on Januvia 25 mg. Patient to return to clinic in 1 month glucometer report for insulin adjustment as needed.

## 2016-04-26 NOTE — Progress Notes (Signed)
   CC: diabetes  HPI:  Mr.Siah Sprinkle is a 65 y.o. with past medical history as outlined below who presents to clinic for diabetes follow-up. Please see problem list for further details of patient's medical issues.  Past Medical History:  Diagnosis Date  . Acute gastritis    hx of  . Diabetic peripheral neuropathy ([place])   . Dyslipidemia   . Hypertension   . Lumbar back pain   . Meralgia paresthetica   . Tubular adenoma of rectum 06/20/2010  . Type 2 diabetes mellitus ([place])     Review of Systems:  Denies dysuria and abdominal pain.  Physical Exam:  Vitals:   04/26/16 1034  BP: (!) 141/71  Pulse: 83  Temp: 97.7 F (36.5 C)  TempSrc: Oral  SpO2: 100%  Weight: 154 lb 4.8 oz (70 kg)   Physical Exam  Constitutional: oriented to person, place, and time. appears well-developed and well-nourished. No distress.  HENT:  Head: Normocephalic and atraumatic.  Nose: Nose normal.  Cardiovascular: Normal rate, regular rhythm and normal heart sounds.  Exam reveals no gallop and no friction rub.   No murmur heard. Pulmonary/Chest: Effort normal and breath sounds normal. No respiratory distress.  has no wheezes.no rales.  Abdominal: Soft. Bowel sounds are normal.  exhibits no distension. There is no tenderness. There is no rebound and no guarding.  Neurological: alert and oriented to person, place, and time.  Skin: Skin is warm and dry. No rash noted.  not diaphoretic. No erythema. No pallor.   Assessment & Plan:   See Encounters Tab for problem based charting.  Patient discussed with Dr. Beryle Beams

## 2016-04-26 NOTE — Progress Notes (Signed)
Medicine attending: Medical history, presenting problems, physical findings, and medications, reviewed with resident physician Dr Diana Truong on the day of the patient visit and I concur with her evaluation and management plan. 

## 2016-05-23 ENCOUNTER — Telehealth: Payer: Self-pay | Admitting: Internal Medicine

## 2016-05-23 NOTE — Telephone Encounter (Signed)
APT. REMINDER CALL, LMTCB °

## 2016-05-24 ENCOUNTER — Ambulatory Visit (INDEPENDENT_AMBULATORY_CARE_PROVIDER_SITE_OTHER): Payer: Medicare Other | Admitting: Internal Medicine

## 2016-05-24 VITALS — BP 136/65 | HR 74 | Temp 98.3°F | Wt 154.5 lb

## 2016-05-24 DIAGNOSIS — E1142 Type 2 diabetes mellitus with diabetic polyneuropathy: Secondary | ICD-10-CM

## 2016-05-24 DIAGNOSIS — E1122 Type 2 diabetes mellitus with diabetic chronic kidney disease: Secondary | ICD-10-CM | POA: Diagnosis not present

## 2016-05-24 DIAGNOSIS — Z87891 Personal history of nicotine dependence: Secondary | ICD-10-CM | POA: Diagnosis not present

## 2016-05-24 DIAGNOSIS — I129 Hypertensive chronic kidney disease with stage 1 through stage 4 chronic kidney disease, or unspecified chronic kidney disease: Secondary | ICD-10-CM

## 2016-05-24 DIAGNOSIS — Z794 Long term (current) use of insulin: Secondary | ICD-10-CM

## 2016-05-24 DIAGNOSIS — Z79899 Other long term (current) drug therapy: Secondary | ICD-10-CM | POA: Diagnosis not present

## 2016-05-24 DIAGNOSIS — N183 Chronic kidney disease, stage 3 unspecified: Secondary | ICD-10-CM

## 2016-05-24 DIAGNOSIS — E781 Pure hyperglyceridemia: Secondary | ICD-10-CM

## 2016-05-24 DIAGNOSIS — E118 Type 2 diabetes mellitus with unspecified complications: Secondary | ICD-10-CM

## 2016-05-24 DIAGNOSIS — I10 Essential (primary) hypertension: Secondary | ICD-10-CM

## 2016-05-24 MED ORDER — INSULIN GLARGINE 100 UNIT/ML SOLOSTAR PEN: PEN_INJECTOR | SUBCUTANEOUS | 3 refills | Status: DC

## 2016-05-24 MED ORDER — GLIPIZIDE ER 5 MG PO TB24: 5.0000 mg | ORAL_TABLET | Freq: Every day | ORAL | 1 refills | Status: DC

## 2016-05-24 NOTE — Patient Instructions (Signed)
I recommend starting Glucotrol (Glipizide) 5mg  daily in place of metformin 1,000 mg BID.  We will need to follow up in another month or so.

## 2016-05-24 NOTE — Progress Notes (Signed)
   CC: Follow up for diabetes medications  HPI:  [name] is a 66 y.o. man with a history of Y8MV complicated by peripheral neuropathy and CKD stage 3-4 here today for follow up of his recent diabetes medication changes. He was instructed to stop metformin due to worsening kidney function and instead start Januvia 25 mg. He is not able to get the Januvia due to lack of insurance prescription coverage and high pharmacy price. He is also recommended to start statin therapy and was taking pravastatin daily. He is not tolerating the pravastatin at all with he reports thigh cramping says his "muscles feel like water" while taking it. He does still have bilateral calf cramps when he gets up in the morning but were not significantly increased on this medicine.  See problem based assessment and plan below for additional details  Past Medical History:  Diagnosis Date  . Acute gastritis    hx of  . Diabetic peripheral neuropathy ([place])   . Dyslipidemia   . Hypertension   . Lumbar back pain   . Meralgia paresthetica   . Tubular adenoma of rectum 06/20/2010  . Type 2 diabetes mellitus ([place])     Review of Systems:  Review of Systems  Constitutional: Negative for malaise/fatigue.  Eyes: Negative for blurred vision.  Respiratory: Negative for shortness of breath.   Cardiovascular: Negative for chest pain and leg swelling.  Gastrointestinal: Negative for diarrhea and nausea.  Genitourinary: Negative for frequency.  Musculoskeletal: Positive for myalgias.  Skin: Negative for rash.  Neurological: Negative for dizziness and headaches.  Endo/Heme/Allergies: Negative for polydipsia.    Physical Exam: Physical Exam  Constitutional: He is well-developed, well-nourished, and in no distress.  HENT:  Chronically very poor dentition with multiple caries  Eyes: Conjunctivae are normal.  Cardiovascular: Normal rate and regular rhythm.   Pulmonary/Chest: Effort normal and breath sounds normal.    Musculoskeletal: Normal range of motion. He exhibits no edema.  Neurological: He is alert. Gait normal.  Skin: Skin is warm and dry.    Vitals:   05/24/16 1020  BP: 136/65  Pulse: 74  Temp: 98.3 F (36.8 C)  TempSrc: Oral  SpO2: 100%  Weight: 154 lb 8 oz (70.1 kg)    Assessment & Plan:   See Encounters Tab for problem based charting.  Patient discussed with Dr. Evette Doffing

## 2016-05-27 NOTE — Assessment & Plan Note (Addendum)
Unfortunately he does not seem to be tolerating statin therapy due to what sounds like myalgias. We'll stop the Pravachol at this time. He certainly very high risk due to his medical comorbidities so reassess the option of a minimal dose hydrophilic statin in the future could still be an option.

## 2016-05-27 NOTE — Assessment & Plan Note (Signed)
A: Unfortunately he was not able to start on Januvia 25 mg as recommended. He does not have any real prescription coverage which limits choices of therapy. He has stopped taking the metformin which is appropriate since he is now pushing into a estimated GFR of less than 30. He still very resistant to any changes of his insulin therapy due to past very bad experiences with short acting insulin and hypoglycemia.  P: -Prescribed glipizide 5 mg daily -Continue insulin Lantus 13 units every morning and 11 units every afternoon -Continue monitoring home CBGs 2-3 times daily and bring glucometer to next clinic visit in about 3 weeks

## 2016-05-27 NOTE — Assessment & Plan Note (Signed)
Blood pressure is controlled today at 136/65. Continue Hydrochlorothiazide 25 mg daily.

## 2016-05-29 NOTE — Progress Notes (Signed)
Internal Medicine Clinic Attending  Case discussed with Dr. Rice at the time of the visit.  We reviewed the resident's history and exam and pertinent patient test results.  I agree with the assessment, diagnosis, and plan of care documented in the resident's note.  

## 2016-06-15 ENCOUNTER — Ambulatory Visit (INDEPENDENT_AMBULATORY_CARE_PROVIDER_SITE_OTHER): Payer: Medicare Other | Admitting: Internal Medicine

## 2016-06-15 ENCOUNTER — Encounter: Payer: Self-pay | Admitting: Internal Medicine

## 2016-06-15 VITALS — BP 138/66 | HR 77 | Temp 97.9°F | Ht 64.0 in | Wt 154.8 lb

## 2016-06-15 DIAGNOSIS — N183 Chronic kidney disease, stage 3 unspecified: Secondary | ICD-10-CM

## 2016-06-15 DIAGNOSIS — E1122 Type 2 diabetes mellitus with diabetic chronic kidney disease: Secondary | ICD-10-CM

## 2016-06-15 DIAGNOSIS — E118 Type 2 diabetes mellitus with unspecified complications: Secondary | ICD-10-CM | POA: Diagnosis not present

## 2016-06-15 DIAGNOSIS — Z87891 Personal history of nicotine dependence: Secondary | ICD-10-CM | POA: Diagnosis not present

## 2016-06-15 DIAGNOSIS — E1165 Type 2 diabetes mellitus with hyperglycemia: Secondary | ICD-10-CM | POA: Diagnosis present

## 2016-06-15 DIAGNOSIS — Z794 Long term (current) use of insulin: Secondary | ICD-10-CM | POA: Diagnosis not present

## 2016-06-15 LAB — POCT GLYCOSYLATED HEMOGLOBIN (HGB A1C): HEMOGLOBIN A1C: 8.5

## 2016-06-15 LAB — GLUCOSE, CAPILLARY: GLUCOSE-CAPILLARY: 384 mg/dL — AB (ref 65–99)

## 2016-06-15 MED ORDER — GLIPIZIDE ER 10 MG PO TB24: 10.0000 mg | ORAL_TABLET | Freq: Every day | ORAL | 2 refills | Status: DC

## 2016-06-15 NOTE — Progress Notes (Signed)
   CC: Follow up for diabetes  HPI:  [name] is a 66 y.o. man here for follow up of his diabetes after a medication change last month.   See problem based assessment and plan below for additional details  Past Medical History:  Diagnosis Date  . Acute gastritis    hx of  . Diabetic peripheral neuropathy ([place])   . Dyslipidemia   . Hypertension   . Lumbar back pain   . Meralgia paresthetica   . Tubular adenoma of rectum 06/20/2010  . Type 2 diabetes mellitus ([place])     Review of Systems:  Review of Systems  Constitutional: Negative for fever.  Eyes: Negative for blurred vision.  Respiratory: Negative for shortness of breath.   Cardiovascular: Negative for leg swelling.  Gastrointestinal: Negative for diarrhea.  Neurological: Negative for dizziness.    Physical Exam: Physical Exam  Constitutional: He is well-developed, well-nourished, and in no distress.  Cardiovascular: Normal rate and regular rhythm.   Pulmonary/Chest: Effort normal and breath sounds normal.  Musculoskeletal:  No calluses or skin changes over hands or feet, no peripheral edema  Neurological: He is alert.  Good sensation in feet and toes  Skin: Skin is warm and dry.    Vitals:   06/15/16 1526  BP: 138/66  Pulse: 77  Temp: 97.9 F (36.6 C)  TempSrc: Oral  SpO2: 100%  Weight: 154 lb 12.8 oz (70.2 kg)  Height: 5\' 4"  (1.626 m)     Assessment & Plan:   See Encounters Tab for problem based charting.  Patient discussed with Dr. Dareen Piano

## 2016-06-15 NOTE — Patient Instructions (Signed)
Aumente glipizide de 5 mg a 10 mg al da.  Le llamaremos para concertar una cita con un mdico de rin en funcin de sus anlisis de [employer]

## 2016-06-16 LAB — BMP8+ANION GAP
ANION GAP: 20 mmol/L — AB (ref 10.0–18.0)
BUN/Creatinine Ratio: 21 (ref 10–24)
BUN: 38 mg/dL — ABNORMAL HIGH (ref 8–27)
CALCIUM: 9.6 mg/dL (ref 8.6–10.2)
CO2: 21 mmol/L (ref 18–29)
CREATININE: 1.84 mg/dL — AB (ref 0.76–1.27)
Chloride: 94 mmol/L — ABNORMAL LOW (ref 96–106)
GFR calc Af Amer: 44 mL/min/{1.73_m2} — ABNORMAL LOW (ref >59)
GFR, EST NON AFRICAN AMERICAN: 38 mL/min/{1.73_m2} — AB (ref >59)
Glucose: 405 mg/dL — ABNORMAL HIGH (ref 65–99)
POTASSIUM: 4.3 mmol/L (ref 3.5–5.2)
SODIUM: 135 mmol/L (ref 134–144)

## 2016-06-16 LAB — MICROALBUMIN / CREATININE URINE RATIO
CREATININE, UR: 122.3 mg/dL
MICROALB/CREAT RATIO: 1937.2 mg/g{creat} — AB (ref 0.0–30.0)
MICROALBUM., U, RANDOM: 2369.2 ug/mL

## 2016-06-18 ENCOUNTER — Other Ambulatory Visit: Payer: Self-pay | Admitting: Internal Medicine

## 2016-06-18 DIAGNOSIS — E118 Type 2 diabetes mellitus with unspecified complications: Secondary | ICD-10-CM

## 2016-06-18 DIAGNOSIS — Z794 Long term (current) use of insulin: Principal | ICD-10-CM

## 2016-06-18 NOTE — Assessment & Plan Note (Signed)
HPI: He continues to have fairly uncontrolled diabetes contributing to his nephropathy. The metabolic panel checked last year suggested progression of disease with GFR of 29 so metformin was discontinued.  A: CKD due to type 2 diabetes with some progression over the past year He's become significantly hyperkalemic on minimal doses of ACE inhibitor or ARB therapy so unfortunately specific treatment of his proteinuria is limited besides just getting better control of the diabetes   P: -Repeat Bmet today -Referral to nephrology

## 2016-06-18 NOTE — Progress Notes (Signed)
Internal Medicine Clinic Attending  Case discussed with Dr. Rice at the time of the visit.  We reviewed the resident's history and exam and pertinent patient test results.  I agree with the assessment, diagnosis, and plan of care documented in the resident's note.  

## 2016-06-18 NOTE — Addendum Note (Signed)
Addended by: Hulan Fray on: 06/18/2016 06:36 PM   Modules accepted: Orders

## 2016-06-18 NOTE — Assessment & Plan Note (Signed)
HPI: [name] started taking glipizide 5 mg daily 3 weeks ago since he was unable to get the Januvia due to cost. Metformin was discontinued at that time due to worsening of his diabetic nephropathy. He is not having any trouble with this so far but feels like his sugars are running a little bit higher. According to glucometer data he has been up to the 300s with p.m. glucose but as low as 69 in the morning. This also corresponds increasing his Lantus insulin to 14 units in the morning and 12 units in the evening.  A: Uncontrolled type 2 diabetes with goal A1c less than 7.0% Control has worsened after discontinuing metformin Abdomen having a lot of difficulty in the past year and a half trying to get better control due to his large variation between a.m. and p.m. glucose readings. I tried to recommend a change to prandial insulin but he had a very negative experience and has been resistant to trying this again. I think a decent option for him might be a twice a day dosing of 70/30 insulin so that he can use a higher dose to cover his daily high blood sugars without bottoming him out overnight, as he currently does with the Lantus insulin. I will try to see if he could meet with Dustin Burns for additional education about this since I think with more time in such a dedicated visit he would be amenable to a change in insulin that doesn't make his dosing significantly more complex.  P: -Increase glipizide to 10mg  daily -Referral to diabetic education -Checking urine microalbumin/creatinine ratio

## 2016-07-13 DIAGNOSIS — I129 Hypertensive chronic kidney disease with stage 1 through stage 4 chronic kidney disease, or unspecified chronic kidney disease: Secondary | ICD-10-CM | POA: Diagnosis not present

## 2016-07-13 DIAGNOSIS — E1165 Type 2 diabetes mellitus with hyperglycemia: Secondary | ICD-10-CM | POA: Diagnosis not present

## 2016-07-13 DIAGNOSIS — R079 Chest pain, unspecified: Secondary | ICD-10-CM | POA: Diagnosis not present

## 2016-07-13 DIAGNOSIS — R072 Precordial pain: Secondary | ICD-10-CM | POA: Diagnosis not present

## 2016-07-14 DIAGNOSIS — R072 Precordial pain: Secondary | ICD-10-CM | POA: Diagnosis not present

## 2016-07-19 ENCOUNTER — Telehealth: Payer: Self-pay | Admitting: Internal Medicine

## 2016-07-19 NOTE — Telephone Encounter (Signed)
APT. REMINDER CALL, LMTCB °

## 2016-07-20 ENCOUNTER — Ambulatory Visit (INDEPENDENT_AMBULATORY_CARE_PROVIDER_SITE_OTHER): Payer: Medicare Other | Admitting: Dietician

## 2016-07-20 ENCOUNTER — Telehealth: Payer: Self-pay | Admitting: Dietician

## 2016-07-20 DIAGNOSIS — Z713 Dietary counseling and surveillance: Secondary | ICD-10-CM | POA: Diagnosis not present

## 2016-07-20 DIAGNOSIS — E1165 Type 2 diabetes mellitus with hyperglycemia: Secondary | ICD-10-CM | POA: Diagnosis not present

## 2016-07-20 DIAGNOSIS — Z794 Long term (current) use of insulin: Secondary | ICD-10-CM | POA: Diagnosis not present

## 2016-07-20 DIAGNOSIS — E118 Type 2 diabetes mellitus with unspecified complications: Secondary | ICD-10-CM

## 2016-07-20 LAB — GLUCOSE, CAPILLARY: Glucose-Capillary: 192 mg/dL — ABNORMAL HIGH (ref 65–99)

## 2016-07-20 NOTE — Telephone Encounter (Signed)
Trying to find out cost of insulin for patient per his request. His chart has a copy of his Medicare card only. His pharmacy says his Medicare part D plan is with [place] [employer].  [phone]. Suggest confirming with Dr. Maudie Mercury prior to ordering.

## 2016-07-20 NOTE — Progress Notes (Addendum)
  Medical Nutrition Therapy:  Appt start time: 1103 end time:  1594. Visit # 1 Last MNT visit was a series of 3 visits in 2015  Assessment:  Primary concerns today: glycemic control.  Patient was seen today using interpreter: # [phone] and language used was [place]. He says his sugar is too low and too high. The metformin hurt his kidneys and the glipizide is causing him to be constipated. He lost his wife last year and now pays for everything himself and reports he does not have a lot of money for expensive medicine. He  After reviewing his meter download and how insulin works he said " I am not ready to die" and  is agreeable to other insulins. However, he wants to know the cost first. His current insulin costs him < 10$ per month.  Preferred Learning Style: No preference indicated  Learning Readiness: Ready  ANTHROPOMETRICS: weight-158.1# BMI- 28# WEIGHT HISTORY: Highest:158#- today ( BMI- Lowest-117# (BMI 19.78 in 2009) SLEEP:good No Food Intolerances, Nausea, Vomiting,Diarrhea: reported. Does report  Constipation:  MEDICATIONS: says the glipizide is not working and the increased dose did not  Make much difference, 13 units lantus QAm and 11 units QPM BLOOD SUGAR:  Checks two times a day, 192 today in office after lunch, meter download average is 222 and 28% are in target over past month, most am readings are in target. Most PM readings are above target.  DIETARY INTAKE: Usual eating pattern includes 2-3 meals and 0 snacks per day.Dining Out (times/week): never Everyday foods include tortillas, eggs, meat and beans.  Avoided foods include vegetables and fruits, sugary foods.   24-hr recall:  B ( 930-10 AM): 1 tortilla and 1 egg L ( 12-1PM): 1 tortilla and meat D ( PM):2 tortillas, beans that he cooks, sometimes onions and peppers, salsa, meat Beverages: 3 bottles of water per day, some diet soda- he does not think he drinks enough  Usual physical activity: ADLs and light walking for 30  minutes most days of the week also adds a morning walk some days  Progress Towards Goal(s):  In progress.   Nutritional Diagnosis:  NI-5.8.4 Inconsistent carbohydrate intake As related to repoerted intake of food is resolved.  As evidenced by per his reported intake now of limiting himself to 1-2 tortillas at a time and has cut out fruit entirely.  Predicted Food and medication interaction as related to as evidenced by     Intervention:  Nutrition education about what causes blood sugars to go up and down , how to read his meter download and how insulin particularly  how Novolog Mix 70/30 works.Recommended prunes 2 per day and 6 bottles of water per day to help with contripation Coordination of care: suggest switching  Teaching Method Utilized: Visual, Auditory, Hands on Handouts given during visit include: how insulin works in Psychologist, prison and probation services, avs, Neurosurgeon Barriers to learning/adherence to lifestyle change: lack of material resources Demonstrated degree of understanding via:  Teach Back   Monitoring/Evaluation:  Dietary intake, exercise, meter, and body weight in 1 year(s).

## 2016-07-20 NOTE — Patient Instructions (Addendum)
  No estoy seguro de su seguro de medicamentos recetados de Medicare, as que llam a su farmacia. Dijeron que creen que tienes [employer]. Podemos saber si un medicamento est cubierto en su plan si tenemos una copia de su tarjeta de  Medicare Parte D.  Recuerde comer 2 ciruelas pasas todos los das y beber 6 botellas de agua para ayudar con su estreimiento / heces duras. Comer ms fibra en nueces, semillas, aguacates, frijoles tambin ayudar.    Butch Penny [phone]

## 2016-09-13 ENCOUNTER — Telehealth: Payer: Self-pay | Admitting: Internal Medicine

## 2016-09-13 NOTE — Telephone Encounter (Signed)
APT. REMINDER CALL, LMTCB °

## 2016-09-14 ENCOUNTER — Encounter: Payer: Self-pay | Admitting: Internal Medicine

## 2016-09-14 ENCOUNTER — Ambulatory Visit (INDEPENDENT_AMBULATORY_CARE_PROVIDER_SITE_OTHER): Payer: Medicare Other | Admitting: Internal Medicine

## 2016-09-14 VITALS — BP 134/67 | HR 70 | Temp 97.5°F | Ht 64.0 in | Wt 155.0 lb

## 2016-09-14 DIAGNOSIS — E118 Type 2 diabetes mellitus with unspecified complications: Secondary | ICD-10-CM

## 2016-09-14 DIAGNOSIS — D649 Anemia, unspecified: Secondary | ICD-10-CM | POA: Diagnosis not present

## 2016-09-14 DIAGNOSIS — E1122 Type 2 diabetes mellitus with diabetic chronic kidney disease: Secondary | ICD-10-CM | POA: Diagnosis not present

## 2016-09-14 DIAGNOSIS — Z794 Long term (current) use of insulin: Principal | ICD-10-CM

## 2016-09-14 DIAGNOSIS — Z87891 Personal history of nicotine dependence: Secondary | ICD-10-CM | POA: Diagnosis not present

## 2016-09-14 DIAGNOSIS — N183 Chronic kidney disease, stage 3 unspecified: Secondary | ICD-10-CM

## 2016-09-14 DIAGNOSIS — Z7984 Long term (current) use of oral hypoglycemic drugs: Secondary | ICD-10-CM | POA: Diagnosis not present

## 2016-09-14 LAB — GLUCOSE, CAPILLARY: GLUCOSE-CAPILLARY: 243 mg/dL — AB (ref 65–99)

## 2016-09-14 LAB — POCT GLYCOSYLATED HEMOGLOBIN (HGB A1C): Hemoglobin A1C: 9.2

## 2016-09-14 MED ORDER — GLIPIZIDE ER 10 MG PO TB24: 10.0000 mg | ORAL_TABLET | Freq: Every day | ORAL | 5 refills | Status: DC

## 2016-09-14 NOTE — Progress Notes (Signed)
   CC: Follow up for diabetes management  HPI:  Mr.Nicole Eatherly is a 66 y.o. male with PMHx detailed below presenting today for changes to his diabetes medications.  See problem based assessment and plan below for additional details.  Past Medical History:  Diagnosis Date  . Acute gastritis    hx of  . Diabetic peripheral neuropathy ([place])   . Dyslipidemia   . Hypertension   . Lumbar back pain   . Meralgia paresthetica   . Tubular adenoma of rectum 06/20/2010  . Type 2 diabetes mellitus ([place])     Review of Systems: Review of Systems  Constitutional: Negative for malaise/fatigue.  Eyes: Negative for blurred vision.  Cardiovascular: Negative for leg swelling.  Genitourinary: Negative for frequency.  Neurological: Negative for sensory change.     Physical Exam: Vitals:   09/14/16 1335  BP: 134/67  Pulse: 70  Temp: 97.5 F (36.4 C)  TempSrc: Oral  SpO2: 100%  Weight: 155 lb (70.3 kg)  Height: 5\' 4"  (1.626 m)   Constitutional: He is well-developed, well-nourished, and in no distress.  Cardiovascular: Normal rate and regular rhythm.   Pulmonary/Chest: CTAB with normal WOB Musculoskeletal: No calluses or skin changes over hands or feet, no calf pain, no peripheral edema  Neurological: He is alert, Good sensation in feet and toes  Skin: Skin is warm and dry.   Assessment & Plan:   See encounters tab for problem based medical decision making.   Patient discussed with Dr. Daryll Drown

## 2016-09-14 NOTE — Patient Instructions (Signed)
  Fue un placer verte hoy, Dustin Burns.  Me gustara que comiences a tomar esta insulina mixta 70/30 EN LUGAR DE tu insulina anterior. Puede comenzar con la dosis anterior de 13 unidades por la maana y 11 unidades por la noche. Si sus azcares se [employer] a esa dosis, puede comenzar a aumentar la dosis de [employer]. Te llamar la prxima semana para ver que estamos bien con esta nueva medicina.

## 2016-09-15 LAB — BMP8+ANION GAP
ANION GAP: 17 mmol/L (ref 10.0–18.0)
BUN/Creatinine Ratio: 15 (ref 10–24)
BUN: 28 mg/dL — ABNORMAL HIGH (ref 8–27)
CALCIUM: 9 mg/dL (ref 8.6–10.2)
CHLORIDE: 100 mmol/L (ref 96–106)
CO2: 22 mmol/L (ref 18–29)
Creatinine, Ser: 1.85 mg/dL — ABNORMAL HIGH (ref 0.76–1.27)
GFR calc Af Amer: 44 mL/min/{1.73_m2} — ABNORMAL LOW (ref >59)
GFR, EST NON AFRICAN AMERICAN: 38 mL/min/{1.73_m2} — AB (ref >59)
GLUCOSE: 251 mg/dL — AB (ref 65–99)
POTASSIUM: 4.4 mmol/L (ref 3.5–5.2)
SODIUM: 139 mmol/L (ref 134–144)

## 2016-09-15 LAB — CBC
HEMOGLOBIN: 9.6 g/dL — AB (ref 13.0–17.7)
Hematocrit: 31.5 % — ABNORMAL LOW (ref 37.5–51.0)
MCH: 22.9 pg — AB (ref 26.6–33.0)
MCHC: 30.5 g/dL — ABNORMAL LOW (ref 31.5–35.7)
MCV: 75 fL — ABNORMAL LOW (ref 79–97)
PLATELETS: 251 10*3/uL (ref 150–379)
RBC: 4.19 x10E6/uL (ref 4.14–5.80)
RDW: 15.7 % — ABNORMAL HIGH (ref 12.3–15.4)
WBC: 6.9 10*3/uL (ref 3.4–10.8)

## 2016-09-15 LAB — PHOSPHORUS: PHOSPHORUS: 2.6 mg/dL (ref 2.5–4.5)

## 2016-09-17 DIAGNOSIS — D649 Anemia, unspecified: Secondary | ICD-10-CM | POA: Insufficient documentation

## 2016-09-17 NOTE — Assessment & Plan Note (Signed)
HPI: He has had a very mild chronic anemia with Hgb 11-12 without microcytosis that may be related to moderate chronic medical renal disease. However on labs today hemoglobin was slightly down at 9.6 with a MCV of 75.  Reportedly Dustin Burns had colonoscopy in 2012 without significant findings. He did not complain of GI symptoms at the visit though this line of questioning was not extensively pursued.  A: This is suggestive of possible iron deficiency anemia in a patient who is previously normocytic. It is not conclusive with one isolated result and no symptomatic complaints.  P: We will need to consider repeating his hemoglobin, iron studies, a more thorough problem focused history and physical, and/or fecal occult blood testing in a future visit

## 2016-09-17 NOTE — Assessment & Plan Note (Addendum)
HPI: Dustin Burns has been feeling fine with increased his glipizide to 10 mg daily since his last visit and is checking blood glucose at home twice daily on most days. He is not complaining of any symptoms related to uncontrolled diabetes such as fatigue polydipsia polyuria or vision changes. He has his glucometer today and on review he has large variations in blood glucose with time of day in the low 100s with a.m. glucose check and ranging high 200s to 400 with p.m. glucose check. Hemoglobin A1c checked today is 9.2%  A: Uncontrolled type 2 diabetes with goal A1c less than 7.0% We discussed insulin options on several occasions and he is now agreeable to trial switching to NovoLog 7030 premix pens so that he could use a higher dose of daily insulin before breakfast a lower dose of insulin before dinner to hopefully control his daytime fluctuations in sugar. I'm recommending this approach over a basal bolus insulin dose as he is very concerned about high and low blood sugars that he experienced when tried on prandial insulin in the past. He is currently not having any hypoglycemic readings and a number of quite high blood sugars so we will start with a 1:1 total daily dose switch.  P: Refill glipizide 10 mg once daily Start NovoLog 7030 premix insulin 13 units before breakfast and 11 units before dinner and we'll check in with very close follow-up as he likely needs dose adjustment.

## 2016-09-17 NOTE — Assessment & Plan Note (Signed)
HPI: He denies any frank urinary symptoms. Kidney function tests have been stable on tests since discontinuing ACE inhibitor for hyperkalemia last year. Repeat basic metabolic panel was checked in clinic today.  A: Stage III kidney disease secondary to diabetic nephropathy  P: He has follow-up appointment scheduled with [facility] on the 15th of this month Continue monitoring every 3 months serum chemistry  Metabolic panel shows no change in serum creatinine compared to 3 months ago at 1.85

## 2016-09-20 NOTE — Progress Notes (Signed)
Internal Medicine Clinic Attending  Case discussed with Dr. Rice soon after the resident saw the patient.  We reviewed the resident's history and exam and pertinent patient test results.  I agree with the assessment, diagnosis, and plan of care documented in the resident's note. 

## 2016-09-25 ENCOUNTER — Other Ambulatory Visit: Payer: Self-pay | Admitting: Nephrology

## 2016-09-25 DIAGNOSIS — D631 Anemia in chronic kidney disease: Secondary | ICD-10-CM | POA: Diagnosis not present

## 2016-09-25 DIAGNOSIS — N183 Chronic kidney disease, stage 3 (moderate): Secondary | ICD-10-CM | POA: Diagnosis not present

## 2016-09-25 DIAGNOSIS — I129 Hypertensive chronic kidney disease with stage 1 through stage 4 chronic kidney disease, or unspecified chronic kidney disease: Secondary | ICD-10-CM | POA: Diagnosis not present

## 2016-09-25 DIAGNOSIS — N2581 Secondary hyperparathyroidism of renal origin: Secondary | ICD-10-CM | POA: Diagnosis not present

## 2016-10-02 ENCOUNTER — Other Ambulatory Visit: Payer: Self-pay | Admitting: Nephrology

## 2016-10-02 DIAGNOSIS — N183 Chronic kidney disease, stage 3 unspecified: Secondary | ICD-10-CM

## 2016-10-03 ENCOUNTER — Other Ambulatory Visit: Payer: Self-pay | Admitting: Nephrology

## 2016-10-03 DIAGNOSIS — I10 Essential (primary) hypertension: Secondary | ICD-10-CM

## 2016-10-05 ENCOUNTER — Encounter: Payer: Self-pay | Admitting: *Deleted

## 2016-10-05 ENCOUNTER — Telehealth: Payer: Self-pay | Admitting: *Deleted

## 2016-10-05 DIAGNOSIS — Z794 Long term (current) use of insulin: Principal | ICD-10-CM

## 2016-10-05 DIAGNOSIS — E118 Type 2 diabetes mellitus with unspecified complications: Secondary | ICD-10-CM

## 2016-10-05 MED ORDER — INSULIN ASPART PROT & ASPART (70-30 MIX) 100 UNIT/ML PEN: PEN_INJECTOR | SUBCUTANEOUS | 11 refills | Status: DC

## 2016-10-05 NOTE — Telephone Encounter (Signed)
Call from pt's daughter, Benjamine Mola - requesting new rx for Novolog 70/30 pen for pt - states he was put on a trial period by Dr Benjamine Mola (x 3 weeks). Had called the pharmacy for refill but was told to call his doctor. Med not on current med list. Pt states CBG this morning was 177.

## 2016-10-05 NOTE — Telephone Encounter (Signed)
I spoke with Dustin Burns via interpreter phone and discussed his current insulin treatment. He reports CBGs ranging from 100s to 300s but thinks it is getting better than before he changed. He reports taking 14 units in the morning and 2 units in the evening. This is because his blood sugars are much higher in the day than at the morning. I recommended he increase the morning dose by at least 2-3 units and keep checking. I will send prescription refills to the pharmacy today. We will plan to see him next month.

## 2016-10-09 ENCOUNTER — Ambulatory Visit
Admission: RE | Admit: 2016-10-09 | Discharge: 2016-10-09 | Disposition: A | Discharge status: Home / Self Care | Payer: Medicare Other | Source: Ambulatory Visit | Attending: Nephrology | Admitting: Nephrology

## 2016-10-09 DIAGNOSIS — I10 Essential (primary) hypertension: Secondary | ICD-10-CM

## 2016-10-09 DIAGNOSIS — N183 Chronic kidney disease, stage 3 (moderate): Secondary | ICD-10-CM | POA: Diagnosis not present

## 2016-10-11 ENCOUNTER — Other Ambulatory Visit: Payer: Self-pay | Admitting: Internal Medicine

## 2016-10-11 ENCOUNTER — Telehealth: Payer: Self-pay | Admitting: Internal Medicine

## 2016-10-11 NOTE — Telephone Encounter (Signed)
[name] is the preferred, please send in new rx to pt's pharmacy if appropriate.  Pt aware of change.Dustin Hidden Cassady5/31/201811:40 AM

## 2016-10-11 NOTE — Telephone Encounter (Signed)
I spoke to Dustin Burns and he is aware that his doctor will be sending a new prescription for Humalog due to his insurance coverage. Patient stated that he is taking 15 units in the morning and 2 units in the afternoon of his novolog. He verbalized understanding.

## 2016-10-12 MED ORDER — INSULIN LISPRO PROT & LISPRO (75-25 MIX) 100 UNIT/ML KWIKPEN: PEN_INJECTOR | SUBCUTANEOUS | 5 refills | Status: DC

## 2016-10-12 NOTE — Telephone Encounter (Signed)
This change is fine to get him back on formulary. New Rx sent to [facility].

## 2016-10-15 ENCOUNTER — Other Ambulatory Visit: Payer: Self-pay

## 2016-10-15 MED ORDER — GLUCOSE BLOOD VI STRP: ORAL_STRIP | 2 refills | Status: DC

## 2016-10-15 NOTE — Telephone Encounter (Signed)
ONETOUCH VERIO test strip, refill request @ [facility] in [facility].

## 2016-11-09 ENCOUNTER — Encounter: Payer: Self-pay | Admitting: Dietician

## 2016-11-09 ENCOUNTER — Ambulatory Visit (INDEPENDENT_AMBULATORY_CARE_PROVIDER_SITE_OTHER): Payer: Medicare Other | Admitting: Internal Medicine

## 2016-11-09 VITALS — BP 146/66 | HR 72 | Temp 97.9°F | Ht 64.0 in | Wt 154.1 lb

## 2016-11-09 DIAGNOSIS — Z87891 Personal history of nicotine dependence: Secondary | ICD-10-CM | POA: Diagnosis not present

## 2016-11-09 DIAGNOSIS — Z79899 Other long term (current) drug therapy: Secondary | ICD-10-CM | POA: Diagnosis not present

## 2016-11-09 DIAGNOSIS — E118 Type 2 diabetes mellitus with unspecified complications: Secondary | ICD-10-CM

## 2016-11-09 DIAGNOSIS — J3089 Other allergic rhinitis: Secondary | ICD-10-CM

## 2016-11-09 DIAGNOSIS — K029 Dental caries, unspecified: Secondary | ICD-10-CM | POA: Diagnosis not present

## 2016-11-09 DIAGNOSIS — I129 Hypertensive chronic kidney disease with stage 1 through stage 4 chronic kidney disease, or unspecified chronic kidney disease: Secondary | ICD-10-CM | POA: Diagnosis not present

## 2016-11-09 DIAGNOSIS — I10 Essential (primary) hypertension: Secondary | ICD-10-CM

## 2016-11-09 DIAGNOSIS — N189 Chronic kidney disease, unspecified: Secondary | ICD-10-CM | POA: Diagnosis not present

## 2016-11-09 DIAGNOSIS — E1165 Type 2 diabetes mellitus with hyperglycemia: Secondary | ICD-10-CM

## 2016-11-09 DIAGNOSIS — E1122 Type 2 diabetes mellitus with diabetic chronic kidney disease: Secondary | ICD-10-CM | POA: Diagnosis not present

## 2016-11-09 DIAGNOSIS — K0381 Cracked tooth: Secondary | ICD-10-CM

## 2016-11-09 DIAGNOSIS — Z794 Long term (current) use of insulin: Secondary | ICD-10-CM

## 2016-11-09 MED ORDER — INSULIN GLARGINE 100 UNIT/ML SOLOSTAR PEN: 15.0000 [IU] | PEN_INJECTOR | Freq: Two times a day (BID) | SUBCUTANEOUS | 5 refills | Status: DC

## 2016-11-09 NOTE — Patient Instructions (Addendum)
Fue un placer verte hoy, Sr. Maus.  Me alegro de que te sientas bien hoy. Yo recomendara aumentar la dosis de insulina por la tarde a 15 unidades. Si olvida una comida en el almuerzo o la cena, intente reducir la dosis a la mitad para Psychologist, occupational de nivel por la [place].

## 2016-11-09 NOTE — Progress Notes (Signed)
Patient says his last eye exam was in 2017. Called Dr. [facility] office for records. Their system was down, so she could not access last records. She said he is an active patient with their office and  they'd call him Monday to let him know when he is due for an appointment.

## 2016-11-09 NOTE — Progress Notes (Signed)
   CC: Follow up for diabetes management  HPI:  [name] is a 66 y.o. man here for a check up after changing his insulin to a different medicine in [place].   See problem based assessment and plan below for additional details  Past Medical History:  Diagnosis Date  . Acute gastritis    hx of  . Diabetic peripheral neuropathy ([place])   . Dyslipidemia   . Hypertension   . Lumbar back pain   . Meralgia paresthetica   . Tubular adenoma of rectum 06/20/2010  . Type 2 diabetes mellitus ([place])     Review of Systems:  Review of Systems  Eyes: Negative for blurred vision.  Cardiovascular: Negative for leg swelling.  Genitourinary: Negative for frequency.  Skin: Negative for rash.  Neurological: Negative for dizziness.  Endo/Heme/Allergies: Negative for polydipsia.    Physical Exam: Physical Exam  Constitutional: He is well-developed, well-nourished, and in no distress.  HENT:  Mouth/Throat: Oropharynx is clear and moist. No oropharyngeal exudate.  Poor dentition with caries and repaired cracks in many teeth  Cardiovascular: Normal rate and regular rhythm.   Pulmonary/Chest: Effort normal and breath sounds normal.  Musculoskeletal: Normal range of motion. He exhibits no edema or tenderness.  Skin: Skin is warm and dry.   Vitals:   11/12/16 1602  BP: (!) 146/66  Pulse: 72  Temp: 97.9 F (36.6 C)  TempSrc: Oral  SpO2: 100%  Weight: 154 lb 1.6 oz (69.9 kg)  Height: 5\' 4"  (1.626 m)    Assessment & Plan:   See Encounters Tab for problem based charting.  Patient discussed with Dr. Daryll Drown

## 2016-11-12 ENCOUNTER — Encounter: Payer: Self-pay | Admitting: Internal Medicine

## 2016-11-12 MED ORDER — LORATADINE 10 MG PO TABS: 10.0000 mg | ORAL_TABLET | Freq: Every day | ORAL | 6 refills | Status: DC

## 2016-11-12 MED ORDER — AMLODIPINE BESYLATE 10 MG PO TABS: 10.0000 mg | ORAL_TABLET | Freq: Every day | ORAL | 11 refills | Status: DC

## 2016-11-12 NOTE — Progress Notes (Signed)
Internal Medicine Clinic Attending  Case discussed with Dr. Rice at the time of the visit.  We reviewed the resident's history and exam and pertinent patient test results.  I agree with the assessment, diagnosis, and plan of care documented in the resident's note.  

## 2016-11-12 NOTE — Assessment & Plan Note (Signed)
Amlodipine 10mg  reordered today

## 2016-11-12 NOTE — Assessment & Plan Note (Signed)
HPI: He switched to the insulin 75/25 mix as discussed but quickly found his CBGs at home to be severely elevated with measurements up to 550. He also started losing weight and having polydipsia and polyuria so he switched himself back to the previous lantus insulin. He is now taking that with 15 units in the morning and 13 units at night. He is also taking his glipizide 10mg  daily. Sicne going back to the previous medication his new symptoms resolved and he is feeling well today.  A: Uncontrolled type 2 diabetes with goal A1c less than 7.0% Unfortunately he did not contact the clinic with the development of his hyperglycemia and symptoms. Based on his May labs his CKD is stable or slightly improved compared to late last year so there is probably no significant progress of his renal disease with the current diabetes control. Reviewing his glucometer the measurements remain somewhat high after switching insulin again so I will recommend an increase of the lantus.  P: Increase lantus insulin to 15U BID Continue glipizide 10mg  daily

## 2017-01-11 ENCOUNTER — Ambulatory Visit (INDEPENDENT_AMBULATORY_CARE_PROVIDER_SITE_OTHER): Payer: Medicare Other | Admitting: Internal Medicine

## 2017-01-11 ENCOUNTER — Encounter: Payer: Self-pay | Admitting: Internal Medicine

## 2017-01-11 VITALS — BP 137/67 | HR 73 | Temp 98.3°F | Ht 64.0 in | Wt 154.1 lb

## 2017-01-11 DIAGNOSIS — N183 Chronic kidney disease, stage 3 (moderate): Secondary | ICD-10-CM | POA: Diagnosis not present

## 2017-01-11 DIAGNOSIS — I10 Essential (primary) hypertension: Secondary | ICD-10-CM

## 2017-01-11 DIAGNOSIS — Z794 Long term (current) use of insulin: Secondary | ICD-10-CM | POA: Diagnosis not present

## 2017-01-11 DIAGNOSIS — I129 Hypertensive chronic kidney disease with stage 1 through stage 4 chronic kidney disease, or unspecified chronic kidney disease: Secondary | ICD-10-CM

## 2017-01-11 DIAGNOSIS — N184 Chronic kidney disease, stage 4 (severe): Secondary | ICD-10-CM

## 2017-01-11 DIAGNOSIS — E785 Hyperlipidemia, unspecified: Secondary | ICD-10-CM | POA: Diagnosis not present

## 2017-01-11 DIAGNOSIS — D649 Anemia, unspecified: Secondary | ICD-10-CM | POA: Diagnosis not present

## 2017-01-11 DIAGNOSIS — E1122 Type 2 diabetes mellitus with diabetic chronic kidney disease: Secondary | ICD-10-CM

## 2017-01-11 DIAGNOSIS — E1142 Type 2 diabetes mellitus with diabetic polyneuropathy: Secondary | ICD-10-CM | POA: Diagnosis not present

## 2017-01-11 DIAGNOSIS — E118 Type 2 diabetes mellitus with unspecified complications: Secondary | ICD-10-CM

## 2017-01-11 LAB — GLUCOSE, CAPILLARY: Glucose-Capillary: 277 mg/dL — ABNORMAL HIGH (ref 65–99)

## 2017-01-11 LAB — POCT GLYCOSYLATED HEMOGLOBIN (HGB A1C): Hemoglobin A1C: 10

## 2017-01-11 MED ORDER — INSULIN LISPRO 100 UNIT/ML ~~LOC~~ SOLN: SUBCUTANEOUS | 0 refills | Status: DC

## 2017-01-11 NOTE — Assessment & Plan Note (Signed)
HPI: His blood sugar control remains inadequate on basal insulin and glipizide alone, with normal CBG measurements in the morning and highs up to 400 in the evening. Hgb A1c is 10.0% today. He is using lantus insulin 10U BID consistently and glipizide 10mg  daily. He has no overt symptoms of hyperglycemia such as polydipsia, urinary frequency, vision changes, or fatigue.  A: Type 2 diabetes with associated nephropathy, poorly controlled There is no good option to get his glucose better controlled during the day without prandial insulin. After discussion, he is amenable to trying this addition despite high resistance to changing his BID lantus.  P: Start insulin novolog 5U before dinner in the afternoon Reduce lantus insulin by up to 2 units if AM CBG gets low (60s) Continue glipizide 10mg  daily F/U in 2 weeks for recheck with glucometer review

## 2017-01-11 NOTE — Progress Notes (Signed)
   CC: Follow up for diabetes  HPI:  [name] is a 66 y.o. here today for follow up of his diabetes management and medication refills.   See problem based assessment and plan below for additional details  Past Medical History:  Diagnosis Date  . Acute gastritis    hx of  . Diabetic peripheral neuropathy ([place])   . Dyslipidemia   . Hypertension   . Lumbar back pain   . Meralgia paresthetica   . Tubular adenoma of rectum 06/20/2010  . Type 2 diabetes mellitus ([place])     Review of Systems:  Review of Systems  Respiratory: Negative for shortness of breath.   Cardiovascular: Negative for chest pain and leg swelling.  Gastrointestinal: Negative for blood in stool, diarrhea and melena.  Genitourinary: Negative for frequency.  Musculoskeletal: Positive for joint pain. Negative for falls.  Neurological: Negative for dizziness.  Endo/Heme/Allergies: Negative for polydipsia.    Physical Exam: Physical Exam  Constitutional: He is well-developed, well-nourished, and in no distress.  HENT:  Very poor dentition with several missing or fractured lower teeth Gingival inflammation around the right lower canine without obvious abscess or tenderness on exam  Cardiovascular: Normal rate and regular rhythm.   Pulmonary/Chest: Effort normal and breath sounds normal.  Musculoskeletal: Normal range of motion. He exhibits no edema.  No tenderness to palpation over medial left knee, strength 5/5, normal ROM  Lymphadenopathy:    He has no cervical adenopathy.    Vitals:   01/11/17 1342  BP: 137/67  Pulse: 73  Temp: 98.3 F (36.8 C)  TempSrc: Oral  SpO2: 100%  Weight: 154 lb 1.6 oz (69.9 kg)  Height: 5\' 4"  (1.626 m)    Assessment & Plan:   See Encounters Tab for problem based charting.  Patient discussed with Dr. Lynnae January

## 2017-01-11 NOTE — Patient Instructions (Signed)
Fue un placer verte hoy, Sr. Oren Bracket.  Recomiendo comenzar a tomar la insulina de accin corta "insulin novolog" una vez al da antes de la cena o su comida ms grande. Comience a tomar este medicamento con aproximadamente 4-5 unidades.  Es posible que necesite disminuir su insulina lantus en 1-2 unidades si baja demasiado por la maana despus de agregar [employer].  Creo que tu rodilla se ve bien hoy. Sin embargo, si el dolor contina empeorando cuando lo volvemos a Tax inspector, es posible que necesitemos una radiografa para asegurarnos de que no haya nuevas lesiones.

## 2017-01-12 LAB — BMP8+ANION GAP
ANION GAP: 18 mmol/L (ref 10.0–18.0)
BUN/Creatinine Ratio: 16 (ref 10–24)
BUN: 37 mg/dL — ABNORMAL HIGH (ref 8–27)
CO2: 21 mmol/L (ref 20–29)
Calcium: 9.6 mg/dL (ref 8.6–10.2)
Chloride: 100 mmol/L (ref 96–106)
Creatinine, Ser: 2.27 mg/dL — ABNORMAL HIGH (ref 0.76–1.27)
GFR calc Af Amer: 34 mL/min/{1.73_m2} — ABNORMAL LOW (ref >59)
GFR calc non Af Amer: 29 mL/min/{1.73_m2} — ABNORMAL LOW (ref >59)
Glucose: 276 mg/dL — ABNORMAL HIGH (ref 65–99)
POTASSIUM: 4.8 mmol/L (ref 3.5–5.2)
SODIUM: 139 mmol/L (ref 134–144)

## 2017-01-12 LAB — CBC
Hematocrit: 35.9 % — ABNORMAL LOW (ref 37.5–51.0)
Hemoglobin: 11.1 g/dL — ABNORMAL LOW (ref 13.0–17.7)
MCH: 24.3 pg — ABNORMAL LOW (ref 26.6–33.0)
MCHC: 30.9 g/dL — ABNORMAL LOW (ref 31.5–35.7)
MCV: 79 fL (ref 79–97)
PLATELETS: 276 10*3/uL (ref 150–379)
RBC: 4.57 x10E6/uL (ref 4.14–5.80)
RDW: 17.5 % — AB (ref 12.3–15.4)
WBC: 6.1 10*3/uL (ref 3.4–10.8)

## 2017-01-15 NOTE — Assessment & Plan Note (Addendum)
HPI: He has a chronic mild anemia, borderling micorcytic noted earlier this year. He has no symptoms of hematochezia, diarrhea/constipation, or melena to suggest obvious GI losses. This has been noted as far back as 2011 blood counts.  A: Chronic anemia This could certainly be related to his chronic medical renal disease which now appears to be stage 4. He has a history of benign tubular adenoma of the rectum evaluated 6 years ago but I would suspect some progression, symptoms, or microcytosis if this was from GI losses with no intervention for years.  P: I do not think there is an urgent problem but iron studies could be checked at his future visit, if severely deficient referral to GI earlier than routine screening may be needed

## 2017-01-15 NOTE — Assessment & Plan Note (Signed)
HPI: Blood pressure is fairly controlled today at 137/67. He has no symptoms of dizziness, edema, dyspnea, or urinary complaints.  A: Blood pressure near goal today on stable regimen amlodipine and HCTZ  P: Continue current medicaitons Bmet today

## 2017-01-16 NOTE — Progress Notes (Signed)
Internal Medicine Clinic Attending  Case discussed with Dr. Rice at the time of the visit.  We reviewed the resident's history and exam and pertinent patient test results.  I agree with the assessment, diagnosis, and plan of care documented in the resident's note.  

## 2017-01-25 ENCOUNTER — Ambulatory Visit: Payer: Medicare Other

## 2017-01-30 ENCOUNTER — Ambulatory Visit (INDEPENDENT_AMBULATORY_CARE_PROVIDER_SITE_OTHER): Payer: Medicare Other | Admitting: Internal Medicine

## 2017-01-30 ENCOUNTER — Ambulatory Visit: Payer: Medicare Other

## 2017-01-30 VITALS — BP 134/73 | HR 77 | Temp 98.1°F | Wt 155.9 lb

## 2017-01-30 DIAGNOSIS — I10 Essential (primary) hypertension: Secondary | ICD-10-CM | POA: Diagnosis not present

## 2017-01-30 DIAGNOSIS — N183 Chronic kidney disease, stage 3 (moderate): Secondary | ICD-10-CM | POA: Diagnosis not present

## 2017-01-30 DIAGNOSIS — Z87891 Personal history of nicotine dependence: Secondary | ICD-10-CM

## 2017-01-30 DIAGNOSIS — E1122 Type 2 diabetes mellitus with diabetic chronic kidney disease: Secondary | ICD-10-CM

## 2017-01-30 DIAGNOSIS — E118 Type 2 diabetes mellitus with unspecified complications: Secondary | ICD-10-CM

## 2017-01-30 DIAGNOSIS — Z794 Long term (current) use of insulin: Secondary | ICD-10-CM

## 2017-01-30 NOTE — Patient Instructions (Addendum)
Mr. Chibuikem, Thang favor, tome su azcar en la sangre tres veces al da ANTES del desayuno, el almuerzo y Secondary school teacher.  Regrese a Artist.  Contine tomando su insulina como lo ha [place].

## 2017-01-30 NOTE — Progress Notes (Signed)
   CC: Type 2 Diabetes mellitus  HPI:  [name] is a 66 y.o. with the past medical documented as below presenting for Type 2 diabetes follow up and CGM evaluation. The patient was last seen in clinic on 01/11/2017. At that time, his afternoon BG readings were noted to range into the 400s. Novolog 5 units prior to dinner in the afternoon was added to his regimen. He is currently taking Lantus 17 units the AM and 15 units in the evening before dinner, in addition to glipizide 10 mg daily.  He typically eats three meals a day at various timesThe patient states he typically checks his BG twice daily in the AM prior to breakfast and the PM prior to dinner. He denies symptoms associated with hypoglycemia or hyperglycemia. Denies tingling or numbness in his hands and feet.   Glucometer evaluation: Morning BG levels are closer to goal ranging between 74-170. The patient's afternoon/evening BG remains elevated, ranging 160-430 in the afternoon. The patient could not remember when the very elevated BGs were taken, before or after a meal. Average BG 220, 26% within target of 70-140. The patient had no hypoglycemic events.   Past Medical History:  Diagnosis Date  . Acute gastritis    hx of  . Diabetic peripheral neuropathy ([place])   . Dyslipidemia   . Hypertension   . Lumbar back pain   . Meralgia paresthetica   . Tubular adenoma of rectum 06/20/2010  . Type 2 diabetes mellitus ([place])    Review of Systems:  Review of Systems  Constitutional: Negative for chills and fever.  HENT: Negative.   Respiratory: Negative for shortness of breath.   Cardiovascular: Negative for chest pain.  Gastrointestinal: Negative for abdominal pain, constipation, diarrhea, nausea and vomiting.  Genitourinary: Negative for frequency.  Musculoskeletal: Negative.   Skin: Negative.   Neurological: Negative for tingling and sensory change.    Physical Exam:  Vitals:   01/30/17 1005  BP: 134/73  Pulse: 77  Temp:  98.1 F (36.7 C)  TempSrc: Oral  SpO2: 99%  Weight: 155 lb 14.4 oz (70.7 kg)   General: Sitting comfortably, NAD HEENT: /AT, EOMI, no scleral icterus Cardiac: RRR, No R/M/G appreciated Pulm: normal effort, CTAB Abd: soft, non tender, non distended, BS normal Ext: extremities well perfused, no peripheral edema Neuro: alert and oriented X3, cranial nerves II-XII grossly intact   Assessment & Plan:   See Encounters Tab for problem based charting.  Patient seen with Dr. Dareen Piano

## 2017-01-30 NOTE — Assessment & Plan Note (Addendum)
Patient taking all DM medications as prescribed. Minimal improvement of afternoon BG based on current glucometer evaluation. It is currently unclear when the patient his checking his BG, before or after meals.   Glucometer evaluation: Morning BG levels are closer to goal ranging between 74-170. The patient's afternoon/evening BG remains elevated, ranging 160-430 in the afternoon. The patient could not remember when the very elevated BGs were taken, before or after a meal. Average BG 220, 26% within target of 70-140. The patient had no hypoglycemic events.   Plan: -Patient to check BG TID before meals for 2 weeks and return to clinic -No adjustments made to current regimen -Continue Latus 17 units AM, 15 units PM, Novolog 5 units in the afternoon prior to dinner, glipizide 10 mg -When patient returns to clinic with pre-prandial BG levels can assess what insulin needs adjusting

## 2017-01-30 NOTE — Assessment & Plan Note (Addendum)
Creatinine elevated on last BMET to 2.27 from 1.85.  Lab Results  Component Value Date   CREATININE 2.27 (H) 01/11/2017   CREATININE 1.85 (H) 09/14/2016   CREATININE 1.84 (H) 06/15/2016   Plan: -BMET today  Addendum 02/01/2017: Lab Results  Component Value Date   CREATININE 2.02 (H) 01/30/2017   CREATININE 2.27 (H) 01/11/2017   CREATININE 1.85 (H) 09/14/2016  Creatinine improved. Electrolytes WNL.

## 2017-01-31 LAB — BMP8+ANION GAP
ANION GAP: 17 mmol/L (ref 10.0–18.0)
BUN/Creatinine Ratio: 22 (ref 10–24)
BUN: 45 mg/dL — ABNORMAL HIGH (ref 8–27)
CHLORIDE: 102 mmol/L (ref 96–106)
CO2: 20 mmol/L (ref 20–29)
Calcium: 9.7 mg/dL (ref 8.6–10.2)
Creatinine, Ser: 2.02 mg/dL — ABNORMAL HIGH (ref 0.76–1.27)
GFR calc Af Amer: 39 mL/min/{1.73_m2} — ABNORMAL LOW (ref >59)
GFR calc non Af Amer: 34 mL/min/{1.73_m2} — ABNORMAL LOW (ref >59)
Glucose: 152 mg/dL — ABNORMAL HIGH (ref 65–99)
POTASSIUM: 4.6 mmol/L (ref 3.5–5.2)
Sodium: 139 mmol/L (ref 134–144)

## 2017-02-04 NOTE — Progress Notes (Signed)
Internal Medicine Clinic Attending  I saw and evaluated the patient.  I personally confirmed the key portions of the history and exam documented by Dr. LaCroce and I reviewed pertinent patient test results.  The assessment, diagnosis, and plan were formulated together and I agree with the documentation in the resident's note.  

## 2017-02-13 ENCOUNTER — Encounter: Payer: Self-pay | Admitting: Internal Medicine

## 2017-02-13 ENCOUNTER — Encounter (INDEPENDENT_AMBULATORY_CARE_PROVIDER_SITE_OTHER): Payer: Self-pay

## 2017-02-13 ENCOUNTER — Ambulatory Visit (INDEPENDENT_AMBULATORY_CARE_PROVIDER_SITE_OTHER): Payer: Medicare Other | Admitting: Internal Medicine

## 2017-02-13 DIAGNOSIS — Z794 Long term (current) use of insulin: Secondary | ICD-10-CM

## 2017-02-13 DIAGNOSIS — I10 Essential (primary) hypertension: Secondary | ICD-10-CM | POA: Diagnosis not present

## 2017-02-13 DIAGNOSIS — N183 Chronic kidney disease, stage 3 unspecified: Secondary | ICD-10-CM

## 2017-02-13 DIAGNOSIS — E1142 Type 2 diabetes mellitus with diabetic polyneuropathy: Secondary | ICD-10-CM | POA: Diagnosis not present

## 2017-02-13 DIAGNOSIS — E118 Type 2 diabetes mellitus with unspecified complications: Secondary | ICD-10-CM

## 2017-02-13 MED ORDER — INSULIN LISPRO 100 UNIT/ML ~~LOC~~ SOLN: 5.0000 [IU] | Freq: Three times a day (TID) | SUBCUTANEOUS | 3 refills | Status: DC

## 2017-02-13 MED ORDER — INSULIN GLARGINE 100 UNIT/ML SOLOSTAR PEN: 25.0000 [IU] | PEN_INJECTOR | Freq: Every day | SUBCUTANEOUS | 5 refills | Status: DC

## 2017-02-13 MED ORDER — GLIPIZIDE ER 10 MG PO TB24: 10.0000 mg | ORAL_TABLET | Freq: Every day | ORAL | 5 refills | Status: DC

## 2017-02-13 NOTE — Progress Notes (Signed)
   CC: DM follow up  HPI:  Dustin Burns is a 66 y.o. male spanish speaking with past medical history outlined below here for DM follow up. An interpretor was used to obtain history. For the details of today's visit, please refer to the assessment and plan.  Past Medical History:  Diagnosis Date  . Acute gastritis    hx of  . Diabetic peripheral neuropathy ([place])   . Dyslipidemia   . Hypertension   . Lumbar back pain   . Meralgia paresthetica   . Tubular adenoma of rectum 06/20/2010  . Type 2 diabetes mellitus ([place])     Review of Systems  Cardiovascular: Negative for chest pain.  Neurological: Negative for dizziness and weakness.    Physical Exam:  Vitals:   02/13/17 1000  BP: (!) 148/67  Pulse: 73  Temp: 97.9 F (36.6 C)  TempSrc: Oral  SpO2: 99%  Weight: 158 lb 1.6 oz (71.7 kg)  Height: 5\' 4"  (1.626 m)    Constitutional: NAD, appears comfortable Cardiovascular: RRR, no murmurs, rubs, or gallops.  Pulmonary/Chest: CTAB, no wheezes, rales, or rhonchi.  Extremities: Warm and well perfused. No edema.  Psychiatric: Normal mood and affect  Assessment & Plan:   See Encounters Tab for problem based charting.  Patient seen with Dr. Angelia Mould

## 2017-02-13 NOTE — Progress Notes (Signed)
Internal Medicine Clinic Attending  I saw and evaluated the patient.  I personally confirmed the key portions of the history and exam documented by Dr. Guilloud and I reviewed pertinent patient test results.  The assessment, diagnosis, and plan were formulated together and I agree with the documentation in the resident's note.  

## 2017-02-13 NOTE — Patient Instructions (Signed)
[name],   Fue un placer verte hoy. Contine tomando su glipizide 10 mg al da segn lo prescrito. Hemos hecho algunos cambios en su insulina hoy.   - Inyecte 25 unidades de lantus por la maana con el desayuno. - Inyecte 5 unidades de novolog tres veces al da con desayuno, almuerzo y [place]  - Por favor contine revisando su azcar en la sangre tres veces al da antes de las comidas Siga con nosotros en 2 semanas con su glucmetro.   Si tiene alguna pregunta o inquietud, llame a Haskell Riling al [phone] o despus del horario de atencin, llame al [phone] y pregunte por el residente de medicina interna de [place]. Gracias!   - Dr. Philipp Ovens   [name],  It was a pleasure to see you today. Please continue to take your glipizide 10 mg daily as prescribed. We have made some changes to your insulin today.  - Please inject 25 units of lantus in the morning with breakfast  - Please inject 5 units of novolog three times a day with breakfast, lunch, and dinner - Please continue to check your blood sugar three times a day prior to meals   Follow up with [place] in 2 weeks with your glucometer. If you have any questions or concerns, call our clinic at [phone] or after hours call [phone] and ask for the internal medicine resident on call. Thank you!  - Dr. Philipp Ovens

## 2017-02-13 NOTE — Assessment & Plan Note (Addendum)
Patient is here for DM follow up. He is currently taking Lantus 17 units in AM, 15 units in PM, and novolog 5 units as needed in the afternoon for CBG > 200 (prescribed scheduled prior to dinner). Review of his glucometer shows total of 82 readings with average CBG of 184, 30% in target range. Overall range of 70 to 533. On average, patient is well controlled in the morning with uncontrolled readings in the evening and afternoon.  -- Change to Lantus 25 units once in AM -- Change Novolog to 5 units TID w/ meals -- Continue glipizide 10 mg daily  -- Continue TID CBG checks prior to meals  -- F/u 2-4 weeks with glucometer

## 2017-02-14 ENCOUNTER — Other Ambulatory Visit: Payer: Self-pay | Admitting: *Deleted

## 2017-02-14 ENCOUNTER — Telehealth: Payer: Self-pay

## 2017-02-14 DIAGNOSIS — Z794 Long term (current) use of insulin: Principal | ICD-10-CM

## 2017-02-14 DIAGNOSIS — E118 Type 2 diabetes mellitus with unspecified complications: Secondary | ICD-10-CM

## 2017-02-14 MED ORDER — INSULIN LISPRO 100 UNIT/ML ~~LOC~~ SOLN: 5.0000 [IU] | Freq: Three times a day (TID) | SUBCUTANEOUS | 3 refills | Status: DC

## 2017-02-14 NOTE — Telephone Encounter (Signed)
Rtc, [facility] did not get the humalog script, gave it verbally, confirmed they did get the lantus.

## 2017-02-14 NOTE — Telephone Encounter (Signed)
Dustin Burns with [facility] needs to speak with a nurse about Novolog 70/30. Please call back.

## 2017-02-27 ENCOUNTER — Ambulatory Visit (INDEPENDENT_AMBULATORY_CARE_PROVIDER_SITE_OTHER): Payer: Medicare Other | Admitting: Internal Medicine

## 2017-02-27 VITALS — BP 151/73 | HR 72 | Temp 97.7°F | Ht 64.0 in | Wt 157.4 lb

## 2017-02-27 DIAGNOSIS — R5381 Other malaise: Secondary | ICD-10-CM

## 2017-02-27 DIAGNOSIS — Z87891 Personal history of nicotine dependence: Secondary | ICD-10-CM

## 2017-02-27 DIAGNOSIS — E119 Type 2 diabetes mellitus without complications: Secondary | ICD-10-CM | POA: Diagnosis present

## 2017-02-27 DIAGNOSIS — E118 Type 2 diabetes mellitus with unspecified complications: Secondary | ICD-10-CM

## 2017-02-27 DIAGNOSIS — R5383 Other fatigue: Secondary | ICD-10-CM | POA: Diagnosis not present

## 2017-02-27 DIAGNOSIS — R531 Weakness: Secondary | ICD-10-CM | POA: Diagnosis not present

## 2017-02-27 DIAGNOSIS — Z794 Long term (current) use of insulin: Secondary | ICD-10-CM | POA: Diagnosis not present

## 2017-02-27 MED ORDER — INSULIN ASPART 100 UNIT/ML FLEXPEN: 5.0000 [IU] | PEN_INJECTOR | Freq: Three times a day (TID) | SUBCUTANEOUS | 11 refills | Status: DC

## 2017-02-27 MED ORDER — GLIPIZIDE ER 10 MG PO TB24: 10.0000 mg | ORAL_TABLET | Freq: Every day | ORAL | 5 refills | Status: DC

## 2017-02-27 NOTE — Patient Instructions (Signed)
Dustin Burns,  It was a pleasure to see you today. Please continue to use your insulin as prescribed, 25 units of Lantus in the morning and 5 units of novolog three times a day with meals. Please follow up with your primary doctor in 2-4 weeks with your glucometer. If you have any questions or concerns, call our clinic at [phone] or after hours call [phone] and ask for the internal medicine resident on call. Thank you!  - Dr. Philipp Ovens

## 2017-02-27 NOTE — Assessment & Plan Note (Signed)
Patient is here for diabetes follow-up. He was last seen 2 weeks ago at which point CBGs were uncontrolled. His Lantus was changed from twice a day dosing to 25 units once a day in the morning. He was also started on scheduled short acting insulin 5 units TID with meals, previously using a sliding scale that he was dosing himself. He is also prescribed glipizide 10 mg daily. Last A1c checked on 01/11/17 was 10.0. Today, patient reports that he never picked up the new short acting insulin prescription. Because of his insurance, he was prescribed Humalog which was preferred rather than the previously prescribed NovoLog flex pen. He did not pick up the prescription because he thought the medication was different. A significant amount of today's visit was spent on medication & DM education. Explained to patient that Humalog and NovoLog are essentially the same medication. He prefers to stick to the NovoLog flex pen. I explained that his insurance may not cover NovoLog flexpen, and instructed him to call back if the prescription was too expensive. He agreed to try Humalog if that is the case. Review of his glucometer today shows total of 84 readings. 26% within target range. He has a wide range of readings from 70 - 500. We discussed diet and limiting carbohydrate intake. He is agreed to meet with our diabetic educator. -- Encouraged compliance -- DM educator referral  -- Continue lantus 25 units q AM -- Start Novolog 5 units TID with meals -- Continue glipizide 10 mg -- F/u 1 month with PCP for A1c recheck

## 2017-02-27 NOTE — Progress Notes (Signed)
   CC: DM follow up  HPI:  [name] is a 66 y.o. male with past medical history outlined below here for DM follow up. For the details of today's visit, please refer to the assessment and plan.  Past Medical History:  Diagnosis Date  . Acute gastritis    hx of  . Diabetic peripheral neuropathy ([place])   . Dyslipidemia   . Hypertension   . Lumbar back pain   . Meralgia paresthetica   . Tubular adenoma of rectum 06/20/2010  . Type 2 diabetes mellitus ([place])     Review of Systems  Constitutional: Positive for malaise/fatigue.  Respiratory: Negative for shortness of breath.   Cardiovascular: Negative for chest pain.  Neurological: Positive for weakness.    Physical Exam:  Vitals:   02/27/17 1008  BP: (!) 151/73  Pulse: 72  Temp: 97.7 F (36.5 C)  TempSrc: Oral  SpO2: 100%  Weight: 157 lb 6.4 oz (71.4 kg)  Height: 5\' 4"  (1.626 m)    Constitutional: NAD, appears comfortable Cardiovascular: RRR, no murmurs, rubs, or gallops.  Pulmonary/Chest: CTAB, no wheezes, rales, or rhonchi.  Extremities: Warm and well perfused. No edema.  Psychiatric: Normal mood and affect  Assessment & Plan:   See Encounters Tab for problem based charting.  Patient discussed with Dr. Lynnae January

## 2017-03-01 NOTE — Progress Notes (Signed)
Internal Medicine Clinic Attending  Case discussed with Dr. Guilloud at the time of the visit.  We reviewed the resident's history and exam and pertinent patient test results.  I agree with the assessment, diagnosis, and plan of care documented in the resident's note.  

## 2017-03-14 ENCOUNTER — Ambulatory Visit (INDEPENDENT_AMBULATORY_CARE_PROVIDER_SITE_OTHER): Payer: Medicare Other | Admitting: Internal Medicine

## 2017-03-14 ENCOUNTER — Encounter: Payer: Medicare Other | Admitting: Dietician

## 2017-03-14 VITALS — BP 134/72 | HR 73 | Temp 98.0°F | Ht 64.0 in | Wt 160.7 lb

## 2017-03-14 DIAGNOSIS — Z794 Long term (current) use of insulin: Secondary | ICD-10-CM

## 2017-03-14 DIAGNOSIS — Z87891 Personal history of nicotine dependence: Secondary | ICD-10-CM

## 2017-03-14 DIAGNOSIS — E119 Type 2 diabetes mellitus without complications: Secondary | ICD-10-CM | POA: Diagnosis not present

## 2017-03-14 DIAGNOSIS — E118 Type 2 diabetes mellitus with unspecified complications: Secondary | ICD-10-CM

## 2017-03-14 DIAGNOSIS — K0889 Other specified disorders of teeth and supporting structures: Secondary | ICD-10-CM

## 2017-03-14 DIAGNOSIS — Z972 Presence of dental prosthetic device (complete) (partial): Secondary | ICD-10-CM | POA: Diagnosis not present

## 2017-03-14 DIAGNOSIS — Z0000 Encounter for general adult medical examination without abnormal findings: Secondary | ICD-10-CM

## 2017-03-14 NOTE — Patient Instructions (Signed)
Fue un Engineer, structural.  Recomiendo aumentar la insulina a la hora del almuerzo a 8 unidades. Esto ayudar a Dietitian en la sangre por la noche.  Si observa valores bajos, debe disminuir la dosis de insulina lantus en 1 o 2 unidades para evitar un nivel bajo de azcar en la sangre durante la noche y la [place].

## 2017-03-14 NOTE — Progress Notes (Signed)
   CC: Follow up for diabetes after medication adjustment  HPI:  [name] is a 66 y.o. male with PMHx detailed below presenting for follow up of his diabetes after recent medication adjustment.  See problem based assessment and plan below for additional details.  Diabetes mellitus type 2 with complications (HCC) HPI: He has started using his humalog insulin 5 units TID before meals. He has suffered no symptomatic hypoglycemia episodes. He noticed several high CBG readings and was worried his lantus insulin does not as well so resumed taking it as split doses 13 units BID. He is checking sugars 3 times daily with highs in the afternoon and evening. A: Diabetes, still above goal the majority of the time He is happy injecting 5 doses which is not truly necessary but should also be effective. We will increase short acting insulin to cover the afternoon highs and decrease basal dose closer to a 50/50 dosing. P: Continue glipizide 10mg  Increase lunch time humalog to 8 units. Decrease lantus insulin by 1-2 units per dose RTC in about 6 weeks for recheck  Preventative health care He declines recommended influenza vaccine today.    Past Medical History:  Diagnosis Date  . Acute gastritis    hx of  . Diabetic peripheral neuropathy ([place])   . Dyslipidemia   . Hypertension   . Lumbar back pain   . Meralgia paresthetica   . Tubular adenoma of rectum 06/20/2010  . Type 2 diabetes mellitus ([place])     Review of Systems: Review of Systems  Constitutional: Negative for chills and fever.  Eyes: Negative for blurred vision.  Respiratory: Negative for shortness of breath.   Cardiovascular: Positive for leg swelling.  Gastrointestinal: Negative for constipation, diarrhea and nausea.  Genitourinary: Negative for frequency.  Skin: Negative for rash.  Neurological: Negative for dizziness and headaches.  Endo/Heme/Allergies: Negative for polydipsia.     Physical Exam: Vitals:   03/14/17 1016  BP: 134/72  Pulse: 73  Temp: 98 F (36.7 C)  TempSrc: Oral  SpO2: 100%  Weight: 160 lb 11.2 oz (72.9 kg)  Height: 5\' 4"  (1.626 m)   GENERAL- alert, co-operative, NAD HEENT- Poor dentition with supporting hardware in place, oral mucosa appears moist CARDIAC- RRR, no murmurs, rubs or gallops. RESP- CTAB, no wheezes or crackles. EXTREMITIES- No injuries or skin changes, symmetric, 1+ pitting pedal edema halfway to knee. SKIN- Warm, dry, No rash or lesion. PSYCH- Normal mood and affect, appropriate thought content and speech.   Assessment & Plan:   See encounters tab for problem based medical decision making.   Patient discussed with Dr. Evette Doffing

## 2017-03-15 NOTE — Progress Notes (Signed)
Internal Medicine Clinic Attending  Case discussed with Dr. Rice at the time of the visit.  We reviewed the resident's history and exam and pertinent patient test results.  I agree with the assessment, diagnosis, and plan of care documented in the resident's note.  

## 2017-03-15 NOTE — Assessment & Plan Note (Addendum)
HPI: He has started using his humalog insulin 5 units TID before meals. He has suffered no symptomatic hypoglycemia episodes. He noticed several high CBG readings and was worried his lantus insulin does not as well so resumed taking it as split doses 13 units BID. He is checking sugars 3 times daily with highs in the afternoon and evening. A: Diabetes, still above goal the majority of the time He is happy injecting 5 doses which is not truly necessary but should also be effective. We will increase short acting insulin to cover the afternoon highs and decrease basal dose closer to a 50/50 dosing. P: Continue glipizide 10mg  Increase lunch time humalog to 8 units. Decrease lantus insulin by 1-2 units per dose RTC in about 6 weeks for recheck

## 2017-03-15 NOTE — Assessment & Plan Note (Signed)
He declines recommended influenza vaccine today.

## 2017-03-26 ENCOUNTER — Other Ambulatory Visit: Payer: Self-pay | Admitting: Internal Medicine

## 2017-03-26 DIAGNOSIS — I10 Essential (primary) hypertension: Secondary | ICD-10-CM

## 2017-04-15 NOTE — Addendum Note (Signed)
Addended by: Hulan Fray on: 04/15/2017 07:53 PM   Modules accepted: Orders

## 2017-04-17 NOTE — Progress Notes (Signed)
   CC: Follow up on type II diabetes mellitus  HPI:  [name] is a 66 y.o. male with history noted below that presents to the internal medicine clinic for follow-up on type 2 diabetes. Please see problem based charting for the status of patient's chronic medical conditions.  Past Medical History:  Diagnosis Date  . Acute gastritis    hx of  . Diabetic peripheral neuropathy ([place])   . Dyslipidemia   . Hypertension   . Lumbar back pain   . Meralgia paresthetica   . Tubular adenoma of rectum 06/20/2010  . Type 2 diabetes mellitus ([place])     Review of Systems:  Review of Systems  Respiratory: Negative for shortness of breath.   Cardiovascular: Negative for chest pain.  Genitourinary: Negative for frequency.  Endo/Heme/Allergies: Negative for polydipsia.    Physical Exam:  Vitals:   04/18/17 1541 04/18/17 1633  BP: (!) 174/77 (!) 159/77  Pulse: 78 77  Temp: 98.3 F (36.8 C)   TempSrc: Oral   SpO2: 100%   Weight: 160 lb 9.6 oz (72.8 kg)   Height: 5\' 4"  (1.626 m)    Physical Exam  Constitutional: He is well-developed, well-nourished, and in no distress.  Cardiovascular: Normal rate, regular rhythm and normal heart sounds. Exam reveals no gallop and no friction rub.  No murmur heard. Pulmonary/Chest: Effort normal and breath sounds normal. No respiratory distress. He has no wheezes. He has no rales.  Skin: Skin is warm and dry.    Assessment & Plan:   See encounters tab for problem based medical decision making.    Patient discussed with Dr. Dareen Piano

## 2017-04-18 ENCOUNTER — Encounter: Payer: Self-pay | Admitting: Internal Medicine

## 2017-04-18 ENCOUNTER — Other Ambulatory Visit: Payer: Self-pay

## 2017-04-18 ENCOUNTER — Ambulatory Visit (INDEPENDENT_AMBULATORY_CARE_PROVIDER_SITE_OTHER): Payer: Medicare Other | Admitting: Internal Medicine

## 2017-04-18 VITALS — BP 159/77 | HR 77 | Temp 98.3°F | Ht 64.0 in | Wt 160.6 lb

## 2017-04-18 DIAGNOSIS — E1122 Type 2 diabetes mellitus with diabetic chronic kidney disease: Secondary | ICD-10-CM

## 2017-04-18 DIAGNOSIS — I129 Hypertensive chronic kidney disease with stage 1 through stage 4 chronic kidney disease, or unspecified chronic kidney disease: Secondary | ICD-10-CM

## 2017-04-18 DIAGNOSIS — I10 Essential (primary) hypertension: Secondary | ICD-10-CM

## 2017-04-18 DIAGNOSIS — Z794 Long term (current) use of insulin: Secondary | ICD-10-CM | POA: Diagnosis not present

## 2017-04-18 DIAGNOSIS — E118 Type 2 diabetes mellitus with unspecified complications: Secondary | ICD-10-CM | POA: Diagnosis not present

## 2017-04-18 DIAGNOSIS — Z87891 Personal history of nicotine dependence: Secondary | ICD-10-CM

## 2017-04-18 DIAGNOSIS — N183 Chronic kidney disease, stage 3 (moderate): Secondary | ICD-10-CM | POA: Diagnosis not present

## 2017-04-18 DIAGNOSIS — Z79899 Other long term (current) drug therapy: Secondary | ICD-10-CM | POA: Diagnosis not present

## 2017-04-18 DIAGNOSIS — Z0000 Encounter for general adult medical examination without abnormal findings: Secondary | ICD-10-CM

## 2017-04-18 LAB — GLUCOSE, CAPILLARY: Glucose-Capillary: 199 mg/dL — ABNORMAL HIGH (ref 65–99)

## 2017-04-18 LAB — POCT GLYCOSYLATED HEMOGLOBIN (HGB A1C): Hemoglobin A1C: 7.7

## 2017-04-18 NOTE — Patient Instructions (Signed)
Dustin Burns,  [name] un placer conocerte.  Por favor regrese en 3 meses

## 2017-04-21 NOTE — Assessment & Plan Note (Signed)
Assessment:  Type II Diabetes Mellitus Patient is currently taking lantus 15 units in am and 14 units in PM, novolog 5-8 units with meals, and glipizide 10mg . Patient recently started taking novolog with meals, last month. Prior to that he had been on glipizide 10 mg daily and Lantus 13 units twice a day.  Hemoglobin A1c today is 7.7, previously 10.0 in 12/2016.    Plan -continue current diabetes regimen -follow up in 3 months for hemoglobin A1C check

## 2017-04-21 NOTE — Assessment & Plan Note (Signed)
Assessment: Essential hypertension Patient takes hydrochlorothiazide 25 mg daily and amlodipine 10 mg daily. Today's blood pressure is elevated at 174/77 and improved on repeat to 159/77.  Discussed the possibility of adding medication if blood pressure continues to be elevated.  Patient is CKD IIIb and options are limited.  Plan - reassess Blood pressure at next visit.  May need to consider adding hydralazine or clonidine or beta blocker at next visit.

## 2017-04-21 NOTE — Assessment & Plan Note (Signed)
Assessment:  Healthcare maintenance Offered referral to Ophthalmology for eye exam.  Patient declined and stated that he could not afford the exam at this time.  Plan -reassess referral placement at next visit

## 2017-05-15 NOTE — Progress Notes (Signed)
Internal Medicine Clinic Attending  Case discussed with Dr. Hoffman at the time of the visit.  We reviewed the resident's history and exam and pertinent patient test results.  I agree with the assessment, diagnosis, and plan of care documented in the resident's note.  

## 2017-08-29 ENCOUNTER — Ambulatory Visit (INDEPENDENT_AMBULATORY_CARE_PROVIDER_SITE_OTHER): Payer: Medicare Other | Admitting: Internal Medicine

## 2017-08-29 ENCOUNTER — Encounter: Payer: Self-pay | Admitting: Internal Medicine

## 2017-08-29 VITALS — BP 142/71 | HR 72 | Temp 98.1°F | Ht 64.0 in | Wt 162.5 lb

## 2017-08-29 DIAGNOSIS — Z794 Long term (current) use of insulin: Secondary | ICD-10-CM | POA: Diagnosis not present

## 2017-08-29 DIAGNOSIS — E119 Type 2 diabetes mellitus without complications: Secondary | ICD-10-CM | POA: Diagnosis not present

## 2017-08-29 DIAGNOSIS — I10 Essential (primary) hypertension: Secondary | ICD-10-CM | POA: Diagnosis not present

## 2017-08-29 DIAGNOSIS — Z79899 Other long term (current) drug therapy: Secondary | ICD-10-CM | POA: Diagnosis not present

## 2017-08-29 DIAGNOSIS — E118 Type 2 diabetes mellitus with unspecified complications: Secondary | ICD-10-CM

## 2017-08-29 LAB — POCT GLYCOSYLATED HEMOGLOBIN (HGB A1C): Hemoglobin A1C: 7.7

## 2017-08-29 LAB — GLUCOSE, CAPILLARY: Glucose-Capillary: 72 mg/dL (ref 65–99)

## 2017-08-29 MED ORDER — GLIPIZIDE ER 5 MG PO TB24
5.0000 mg | ORAL_TABLET | Freq: Every day | ORAL | 2 refills | Status: DC
Start: 2017-08-29 — End: 2017-11-28

## 2017-08-29 MED ORDER — INSULIN GLARGINE 100 UNIT/ML SOLOSTAR PEN: PEN_INJECTOR | SUBCUTANEOUS | 3 refills | Status: DC

## 2017-08-29 MED ORDER — AMLODIPINE BESYLATE 10 MG PO TABS: 10.0000 mg | ORAL_TABLET | Freq: Every day | ORAL | 11 refills | Status: DC

## 2017-08-29 MED ORDER — HYDROCHLOROTHIAZIDE 25 MG PO TABS: 25.0000 mg | ORAL_TABLET | Freq: Every day | ORAL | 3 refills | Status: DC

## 2017-08-29 MED ORDER — HUMALOG KWIKPEN 100 UNIT/ML ~~LOC~~ SOPN: PEN_INJECTOR | SUBCUTANEOUS | 3 refills | Status: DC

## 2017-08-29 MED ORDER — GLUCOSE BLOOD VI STRP: ORAL_STRIP | 2 refills | Status: DC

## 2017-08-29 NOTE — Progress Notes (Signed)
   CC: Follow up on type II diabetes  HPI:  Dustin Burns is a 67 y.o. male with history noted below that presents to the Internal medicine clinic for follow up on type II diabetes mellitus.  Please see problem based charting for the status of patient's chronic medical conditions.  Past Medical History:  Diagnosis Date  . Acute gastritis    hx of  . Diabetic peripheral neuropathy ([place])   . Dyslipidemia   . Hypertension   . Lumbar back pain   . Meralgia paresthetica   . Tubular adenoma of rectum 06/20/2010  . Type 2 diabetes mellitus ([place])     Review of Systems:  Review of Systems  Constitutional: Negative for malaise/fatigue.  Respiratory: Negative for shortness of breath.   Cardiovascular: Negative for chest pain.     Physical Exam:  Vitals:   08/29/17 1537 08/29/17 1552  BP: (!) 152/67 (!) 142/71  Pulse: 72 72  Temp: 98.1 F (36.7 C)   TempSrc: Oral   SpO2: 99%   Weight: 162 lb 8 oz (73.7 kg)   Height: 5\' 4"  (1.626 m)    Physical Exam  Constitutional: He is well-developed, well-nourished, and in no distress.  Cardiovascular: Normal rate, regular rhythm and normal heart sounds. Exam reveals no gallop and no friction rub.  No murmur heard. Pulmonary/Chest: Effort normal and breath sounds normal. No respiratory distress. He has no wheezes. He has no rales.  Musculoskeletal: He exhibits no edema.     Assessment & Plan:   See encounters tab for problem based medical decision making.   Patient discussed with Dr. Eppie Gibson

## 2017-08-29 NOTE — Patient Instructions (Signed)
Mr. Manuel, [place] un placer verte hoy.  Por favor empieze a tomar 16 unidades de lantus en por la manana.  Por favor tome glipizide 5mg  daily.  Por favor regrese en 3 meses.

## 2017-08-30 LAB — BMP8+ANION GAP
ANION GAP: 18 mmol/L (ref 10.0–18.0)
BUN / CREAT RATIO: 18 (ref 10–24)
BUN: 42 mg/dL — ABNORMAL HIGH (ref 8–27)
CO2: 20 mmol/L (ref 20–29)
CREATININE: 2.38 mg/dL — AB (ref 0.76–1.27)
Calcium: 9.2 mg/dL (ref 8.6–10.2)
Chloride: 104 mmol/L (ref 96–106)
GFR calc Af Amer: 32 mL/min/{1.73_m2} — ABNORMAL LOW (ref >59)
GFR, EST NON AFRICAN AMERICAN: 28 mL/min/{1.73_m2} — AB (ref >59)
Glucose: 116 mg/dL — ABNORMAL HIGH (ref 65–99)
POTASSIUM: 4.3 mmol/L (ref 3.5–5.2)
SODIUM: 142 mmol/L (ref 134–144)

## 2017-09-01 NOTE — Assessment & Plan Note (Signed)
Assessment:  Type II Diabetes Mellitus Patient is currently taking lantus 14 units in am and 14 units in PM, novolog 5-8 units with meals, and glipizide 10mg .  Hemoglobin A1c today is 7.7 and previously 7.7.  My goal for the patient is 7.0.  Based on meter log cbgs are highest in the mid day.  Will increase lantus to 16 units in the morning.  Patient does mention some episodes of hypoglycemia.  Will decrease glipizide to 5mg  from 10mg  daily.  Patient states he can adjust his novolog based on cbg readings based on the changes made today.  Plan -16 units of lantus in the morning and 14 units in the evening - novolog 5-8 units with meals - decrease glipizide to 5mg  daily -follow up in 3 months for hemoglobin A1C check

## 2017-09-01 NOTE — Assessment & Plan Note (Signed)
Assessment:  Essential hypertension Blood pressure today was 142/71.   Not quite at goal of <130/80.  Currently take HCTZ 25mg  daily and amlodipine 10mg  daily.  Discussed adding a third agent however patient declines to do so today.  Plan -continue to monitor on current blood pressure medications

## 2017-09-02 NOTE — Progress Notes (Signed)
Case discussed with Dr. Young Berry at the time of the visit.  We reviewed the resident's history and exam and pertinent patient test results.  I agree with the assessment, diagnosis and plan of care documented in the resident's note.

## 2017-10-02 ENCOUNTER — Other Ambulatory Visit: Payer: Self-pay | Admitting: Internal Medicine

## 2017-10-02 DIAGNOSIS — J3089 Other allergic rhinitis: Secondary | ICD-10-CM

## 2017-11-28 ENCOUNTER — Other Ambulatory Visit: Payer: Self-pay | Admitting: *Deleted

## 2017-11-28 DIAGNOSIS — J3089 Other allergic rhinitis: Secondary | ICD-10-CM

## 2017-11-28 MED ORDER — GLIPIZIDE ER 5 MG PO TB24: 5.0000 mg | ORAL_TABLET | Freq: Every day | ORAL | 0 refills | Status: DC

## 2017-11-28 MED ORDER — LORATADINE 10 MG PO TABS: 10.0000 mg | ORAL_TABLET | Freq: Every day | ORAL | 0 refills | Status: DC

## 2017-11-28 MED ORDER — HUMALOG KWIKPEN 100 UNIT/ML ~~LOC~~ SOPN: PEN_INJECTOR | SUBCUTANEOUS | 0 refills | Status: DC

## 2017-11-28 MED ORDER — INSULIN GLARGINE 100 UNIT/ML SOLOSTAR PEN: PEN_INJECTOR | SUBCUTANEOUS | 0 refills | Status: DC

## 2017-12-11 NOTE — Progress Notes (Signed)
   CC: Follow up on essential hypertension and type II diabetes mellitus  HPI:  Dustin Burns is a 67 y.o. male with history noted below that presents to the Internal medicine clinic for follow up on essential hypertension and type II diabetes mellitus.  Please see problem based charting for the status of the patient's chronic medical conditions.  Past Medical History:  Diagnosis Date  . Acute gastritis    hx of  . Diabetic peripheral neuropathy ([place])   . Dyslipidemia   . Hypertension   . Lumbar back pain   . Meralgia paresthetica   . Tubular adenoma of rectum 06/20/2010  . Type 2 diabetes mellitus ([place])     Review of Systems:  Review of Systems  Constitutional: Negative for malaise/fatigue.  Respiratory: Negative for shortness of breath.   Cardiovascular: Negative for chest pain.     Physical Exam:  Vitals:   12/17/17 1549  BP: 130/67  Pulse: 71  Temp: 98.1 F (36.7 C)  TempSrc: Oral  SpO2: 99%  Weight: 158 lb 3.2 oz (71.8 kg)   Physical Exam  Constitutional: He appears well-developed.  Cardiovascular: Normal rate, regular rhythm and normal heart sounds. Exam reveals no gallop and no friction rub.  No murmur heard. 2+ pedal pulses bilaterally   Pulmonary/Chest: Effort normal and breath sounds normal. No stridor. No respiratory distress. He has no wheezes.     Assessment & Plan:   See encounters tab for problem based medical decision making.   Patient discussed with Dr. Dareen Piano

## 2017-12-17 ENCOUNTER — Ambulatory Visit (INDEPENDENT_AMBULATORY_CARE_PROVIDER_SITE_OTHER): Payer: Medicare Other | Admitting: Internal Medicine

## 2017-12-17 ENCOUNTER — Encounter: Payer: Self-pay | Admitting: Internal Medicine

## 2017-12-17 ENCOUNTER — Encounter (INDEPENDENT_AMBULATORY_CARE_PROVIDER_SITE_OTHER): Payer: Self-pay

## 2017-12-17 VITALS — BP 130/67 | HR 71 | Temp 98.1°F | Wt 158.2 lb

## 2017-12-17 DIAGNOSIS — Z79899 Other long term (current) drug therapy: Secondary | ICD-10-CM

## 2017-12-17 DIAGNOSIS — E1122 Type 2 diabetes mellitus with diabetic chronic kidney disease: Secondary | ICD-10-CM | POA: Diagnosis not present

## 2017-12-17 DIAGNOSIS — I129 Hypertensive chronic kidney disease with stage 1 through stage 4 chronic kidney disease, or unspecified chronic kidney disease: Secondary | ICD-10-CM | POA: Diagnosis not present

## 2017-12-17 DIAGNOSIS — E1142 Type 2 diabetes mellitus with diabetic polyneuropathy: Secondary | ICD-10-CM

## 2017-12-17 DIAGNOSIS — E118 Type 2 diabetes mellitus with unspecified complications: Secondary | ICD-10-CM

## 2017-12-17 DIAGNOSIS — Z23 Encounter for immunization: Secondary | ICD-10-CM | POA: Diagnosis not present

## 2017-12-17 DIAGNOSIS — Z794 Long term (current) use of insulin: Secondary | ICD-10-CM | POA: Diagnosis not present

## 2017-12-17 DIAGNOSIS — I10 Essential (primary) hypertension: Secondary | ICD-10-CM

## 2017-12-17 DIAGNOSIS — Z0000 Encounter for general adult medical examination without abnormal findings: Secondary | ICD-10-CM

## 2017-12-17 DIAGNOSIS — N184 Chronic kidney disease, stage 4 (severe): Secondary | ICD-10-CM

## 2017-12-17 LAB — GLUCOSE, CAPILLARY: Glucose-Capillary: 131 mg/dL — ABNORMAL HIGH (ref 70–99)

## 2017-12-17 LAB — POCT GLYCOSYLATED HEMOGLOBIN (HGB A1C): HEMOGLOBIN A1C: 7.4 % — AB (ref 4.0–5.6)

## 2017-12-17 MED ORDER — GLUCOSE BLOOD VI STRP: ORAL_STRIP | 11 refills | Status: AC

## 2017-12-17 NOTE — Patient Instructions (Signed)
Por favor tome lantus 10 unidades por la tarde.  Por favor regrese en 3 meses

## 2017-12-19 NOTE — Assessment & Plan Note (Signed)
Assessment:  Healthcare maintenance  Patient due for prevnar 13, offered in office and patient accepted.  Plan -prevnar 13 in office

## 2017-12-19 NOTE — Assessment & Plan Note (Signed)
Assessment:  Essential hypertension Currently take HCTZ 25mg  daily and amlodipine 10mg  daily and is adherent.  Blood pressure today is 130/67, stable and well controlled.    Plan - continue current blood pressure medications

## 2017-12-19 NOTE — Assessment & Plan Note (Signed)
Assessment:  Type II diabetes mellitus Patient currently takes 16 lantus units in the am and 14 units in the pm and glipizide 5 mg daily.  Hemoglobin A1C today is 7.4 down from 7.7 at last visit in 08/2017.  Assessing glucose log patient has several low readings <70 in the mornings.  Will lower night time lantus from 14units to 10units and have asked patient to re-introduce novolog for mealtime coverage 3-5 units, sliding scale.  Plan -decrease lantus from 14 units in pm to 10 units -novolog 3-5 units at meal time - foot exam

## 2017-12-19 NOTE — Assessment & Plan Note (Signed)
Assessment:  CKD stage IV Discussed with patient about current status of CKD and offered referral to nephrology.  Patient declined.    Plan -continue to monitor with BMET at future visit - avoid nephrotoxic agents -continue to manage htn and DM II to goal

## 2017-12-20 NOTE — Progress Notes (Signed)
Internal Medicine Clinic Attending  Case discussed with Dr. Hoffman at the time of the visit.  We reviewed the resident's history and exam and pertinent patient test results.  I agree with the assessment, diagnosis, and plan of care documented in the resident's note.  

## 2017-12-24 ENCOUNTER — Encounter: Payer: Medicare Other | Admitting: Internal Medicine

## 2018-01-09 ENCOUNTER — Telehealth: Payer: Self-pay | Admitting: Internal Medicine

## 2018-02-26 ENCOUNTER — Other Ambulatory Visit: Payer: Self-pay

## 2018-02-26 NOTE — Telephone Encounter (Signed)
glipiZIDE (GLUCOTROL XL) 5 MG 24 hr tablet, refill request @  [place], [place] - [place] [phone] (Phone) [phone] (Fax)

## 2018-02-27 ENCOUNTER — Other Ambulatory Visit: Payer: Self-pay | Admitting: Internal Medicine

## 2018-04-17 ENCOUNTER — Ambulatory Visit (INDEPENDENT_AMBULATORY_CARE_PROVIDER_SITE_OTHER): Payer: Medicare Other | Admitting: Internal Medicine

## 2018-04-17 ENCOUNTER — Other Ambulatory Visit: Payer: Self-pay

## 2018-04-17 ENCOUNTER — Encounter: Payer: Self-pay | Admitting: Internal Medicine

## 2018-04-17 ENCOUNTER — Encounter: Payer: Self-pay | Admitting: Dietician

## 2018-04-17 ENCOUNTER — Ambulatory Visit (INDEPENDENT_AMBULATORY_CARE_PROVIDER_SITE_OTHER): Payer: Medicare Other | Admitting: Dietician

## 2018-04-17 VITALS — BP 140/64 | HR 75 | Temp 98.7°F | Ht 64.0 in | Wt 158.7 lb

## 2018-04-17 DIAGNOSIS — Z794 Long term (current) use of insulin: Secondary | ICD-10-CM | POA: Diagnosis not present

## 2018-04-17 DIAGNOSIS — E118 Type 2 diabetes mellitus with unspecified complications: Secondary | ICD-10-CM

## 2018-04-17 DIAGNOSIS — I10 Essential (primary) hypertension: Secondary | ICD-10-CM

## 2018-04-17 DIAGNOSIS — E119 Type 2 diabetes mellitus without complications: Secondary | ICD-10-CM

## 2018-04-17 DIAGNOSIS — Z79899 Other long term (current) drug therapy: Secondary | ICD-10-CM | POA: Diagnosis not present

## 2018-04-17 LAB — POCT GLYCOSYLATED HEMOGLOBIN (HGB A1C): Hemoglobin A1C: 7.3 % — AB (ref 4.0–5.6)

## 2018-04-17 LAB — GLUCOSE, CAPILLARY: GLUCOSE-CAPILLARY: 84 mg/dL (ref 70–99)

## 2018-04-17 MED ORDER — GLIPIZIDE ER 5 MG PO TB24: ORAL_TABLET | ORAL | 0 refills | Status: DC

## 2018-04-17 NOTE — Progress Notes (Signed)
Discussed possible Continuous glucose monitoring for patient with Dr. Heber [place].

## 2018-04-17 NOTE — Progress Notes (Signed)
   CC: follow-up on type 2 diabetes  HPI:  [name] is a 67 y.o. male with history noted below that presents to the internal medicine clinic for follow-up on type 2 diabetes. Please see problem based charting for the status of patient's chronic medical conditions.  Past Medical History:  Diagnosis Date  . Acute gastritis    hx of  . Diabetic peripheral neuropathy ([place])   . Dyslipidemia   . Hypertension   . Lumbar back pain   . Meralgia paresthetica   . Tubular adenoma of rectum 06/20/2010  . Type 2 diabetes mellitus ([place])     Review of Systems:  Review of Systems  Constitutional: Negative for malaise/fatigue.  Respiratory: Negative for shortness of breath.   Cardiovascular: Negative for chest pain, orthopnea and leg swelling.  Gastrointestinal: Negative for nausea and vomiting.  Genitourinary: Negative for frequency.  Musculoskeletal: Negative for joint pain.  Neurological: Negative for dizziness, tremors and headaches.  Endo/Heme/Allergies: Negative for polydipsia.     Physical Exam:  Vitals:   04/17/18 1428  BP: 140/64  Pulse: 75  Temp: 98.7 F (37.1 C)  TempSrc: Oral  SpO2: 100%  Weight: 158 lb 11.2 oz (72 kg)  Height: 5\' 4"  (1.626 m)   Physical Exam  Constitutional: He is well-developed, well-nourished, and in no distress.  Cardiovascular: Normal rate, regular rhythm and normal heart sounds. Exam reveals no gallop and no friction rub.  No murmur heard. Pulmonary/Chest: Effort normal and breath sounds normal. No respiratory distress. He has no wheezes. He has no rales.  Musculoskeletal: He exhibits no edema.  Skin: Skin is warm and dry.     Assessment & Plan:   See encounters tab for problem based medical decision making.   Patient discussed with Dr. Dareen Piano

## 2018-04-17 NOTE — Patient Instructions (Signed)
Mr. Garold, [place] un placer en verte hoy.  Por favor regrese en 3 meses.

## 2018-04-20 NOTE — Assessment & Plan Note (Signed)
Assessment:  Type II diabetes mellitus Patient currently takes 16 lantus units in the am and 14 units in the pm and glipizide 15 mg daily.   On previous office visit hemoglobin A1c was 7.4.  At that time he had several low readings <70 in the morning.and adjustments were made to his insulin.  He states he felt well so he didn't make the changes.   Hemoglobin A1C today is 7.3.  He has 2 readings of hypoglycemia (69) from his meter.    He denies any hypoglycemic symptoms.  Average glucose reading is 161 for 64 readings. Offered CGM monitoring and patient declined.  He states he feels well and would like to remain on current regimen.  Of note patient is prescribed 5mg  of glipizide and was taking 15mg .  Told patient to adjust his regimen to 5mg  and patient agreeable.  This will likely help correct his hypoglycemic episodes.    Plan -Continue with Lantus 16 units in the morning and 14 units in the evening -Glipizide 5 mg daily -Follow-up in 3 months

## 2018-04-20 NOTE — Assessment & Plan Note (Signed)
Assessment: Essential hypertension He currently takes HCTZ 25mg  daily and amlodipine 10mg  daily and is adherent to this regimen.  Blood pressure today is 140/64, stable and well controlled.    Plan - continue current blood pressure medications

## 2018-04-21 NOTE — Progress Notes (Signed)
Internal Medicine Clinic Attending  Case discussed with Dr. Hoffman at the time of the visit.  We reviewed the resident's history and exam and pertinent patient test results.  I agree with the assessment, diagnosis, and plan of care documented in the resident's note.  

## 2018-07-31 ENCOUNTER — Encounter: Payer: Medicare Other | Admitting: Internal Medicine

## 2018-08-07 ENCOUNTER — Telehealth: Payer: Self-pay

## 2018-08-07 NOTE — Telephone Encounter (Signed)
Requesting all meds to be filled @  [place], [place] - [place] [phone] (Phone) [phone] (Fax)

## 2018-08-08 ENCOUNTER — Other Ambulatory Visit: Payer: Self-pay | Admitting: Internal Medicine

## 2018-08-11 ENCOUNTER — Other Ambulatory Visit: Payer: Self-pay | Admitting: Internal Medicine

## 2018-08-11 MED ORDER — GLIPIZIDE ER 5 MG PO TB24: ORAL_TABLET | ORAL | 1 refills | Status: DC

## 2018-08-11 MED ORDER — AMLODIPINE BESYLATE 10 MG PO TABS: 10.0000 mg | ORAL_TABLET | Freq: Every day | ORAL | 1 refills | Status: DC

## 2018-08-11 MED ORDER — HYDROCHLOROTHIAZIDE 25 MG PO TABS: 25.0000 mg | ORAL_TABLET | Freq: Every day | ORAL | 1 refills | Status: DC

## 2018-08-11 MED ORDER — INSULIN GLARGINE 100 UNIT/ML SOLOSTAR PEN: PEN_INJECTOR | SUBCUTANEOUS | 0 refills | Status: DC

## 2018-08-11 NOTE — Telephone Encounter (Signed)
All meds refilled thanks

## 2018-08-12 ENCOUNTER — Other Ambulatory Visit: Payer: Self-pay | Admitting: *Deleted

## 2018-08-12 DIAGNOSIS — J3089 Other allergic rhinitis: Secondary | ICD-10-CM

## 2018-08-12 MED ORDER — LORATADINE 10 MG PO TABS: 10.0000 mg | ORAL_TABLET | Freq: Every day | ORAL | 0 refills | Status: DC

## 2018-08-25 ENCOUNTER — Telehealth: Payer: Self-pay

## 2018-08-25 NOTE — Telephone Encounter (Signed)
hydrochlorothiazide (HYDRODIURIL) 25 MG tablet  Refill request @  [place], [place] - [place] [phone] (Phone) [phone] (Fax)

## 2018-08-25 NOTE — Telephone Encounter (Signed)
HCTZ #90 with 1 refill sent 08/11/2018. Confirmed with pharmacy that they have this Rx and will get it ready for patient. Hubbard Hartshorn, RN, BSN

## 2018-09-12 ENCOUNTER — Other Ambulatory Visit: Payer: Self-pay | Admitting: Internal Medicine

## 2018-09-12 NOTE — Telephone Encounter (Signed)
Called pharm, they are in process of filling amlodipine, they will call him

## 2018-09-12 NOTE — Telephone Encounter (Signed)
Needs refill on amLODipine (NORVASC) 10 MG tablet [place], [place] [facility] [place]  ;pt contact [phone]

## 2018-09-14 IMAGING — US US RENAL
1 series · 13 of 25 positions shown · non-contrast
Comparison: None.

CLINICAL DATA: Stage 3 chronic kidney disease, hyper tension,
diabetes and dyslipidemia. Evaluate renal artery stenosis peer

EXAM:
RENAL/URINARY TRACT ULTRASOUND
RENAL DUPLEX DOPPLER ULTRASOUND

[Series 1: us renal · 0.30mm/px · 13 of 59 slices shown]
[im 1/59]
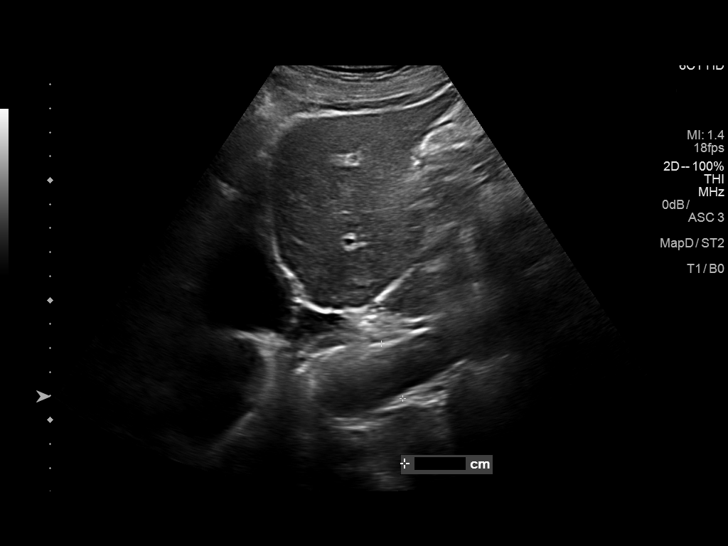
[im 5/59]
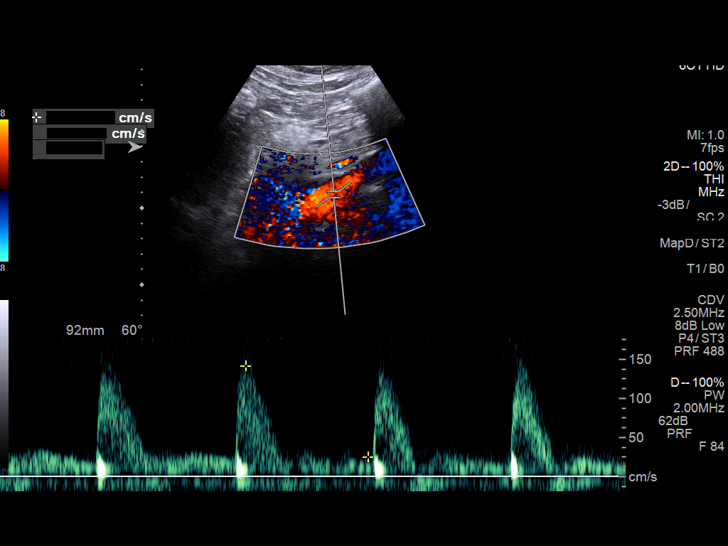
[im 10/59]
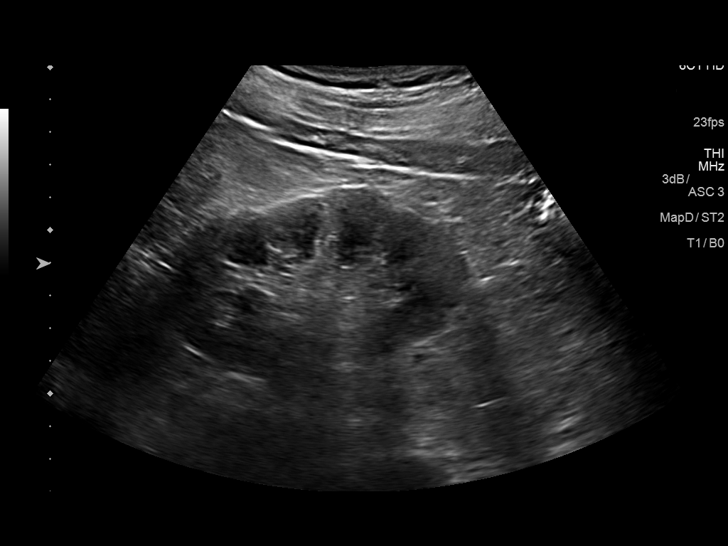
[im 15/59]
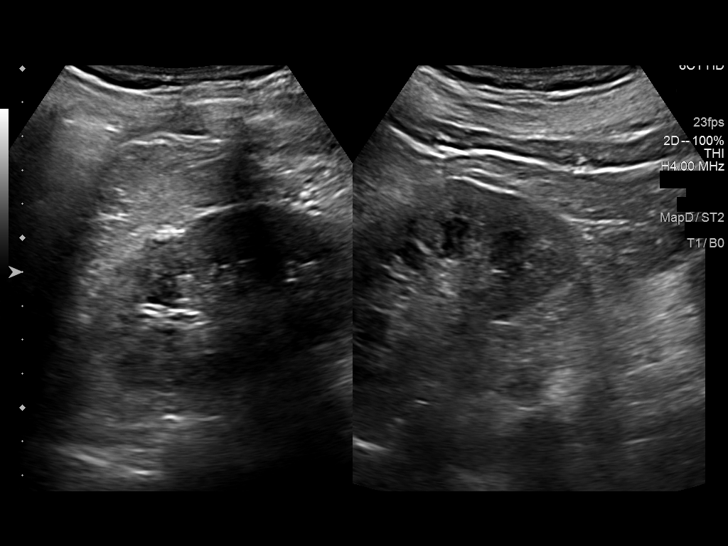
[im 20/59]
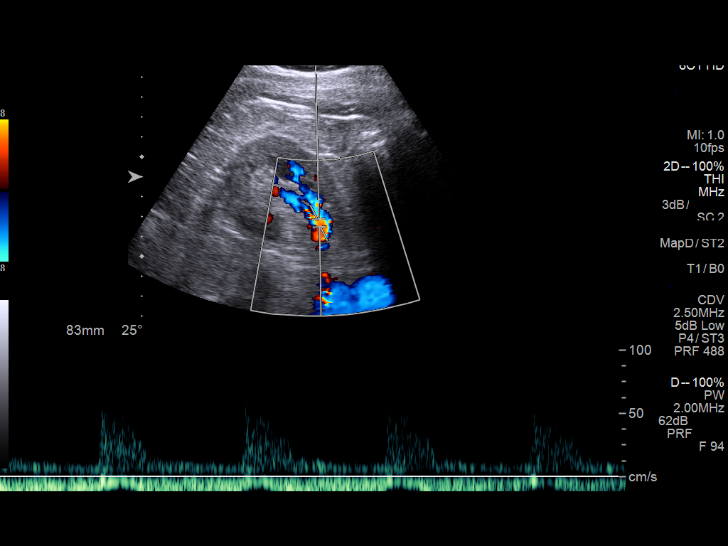
[im 25/59]
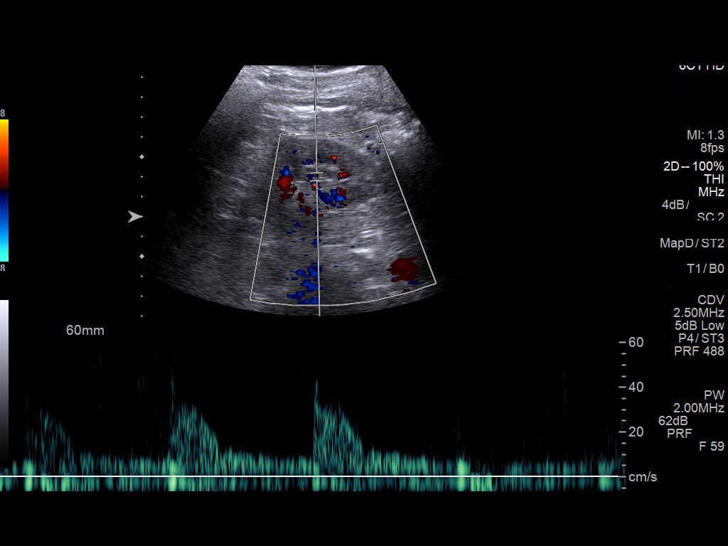
[im 30/59]
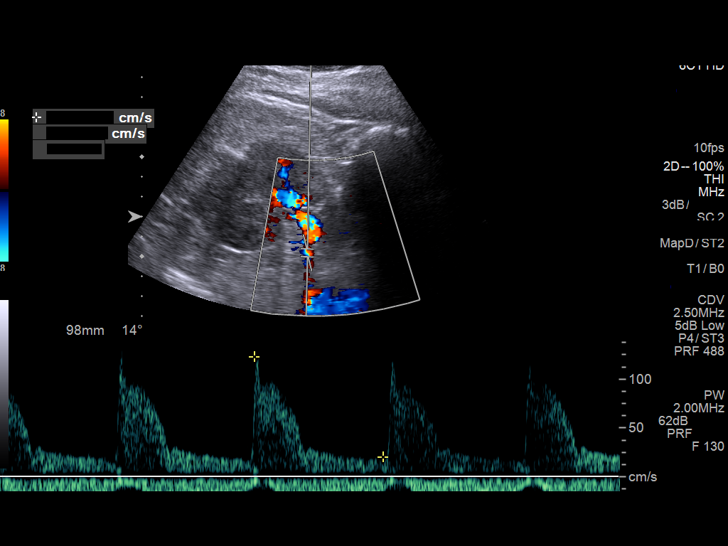
[im 34/59]
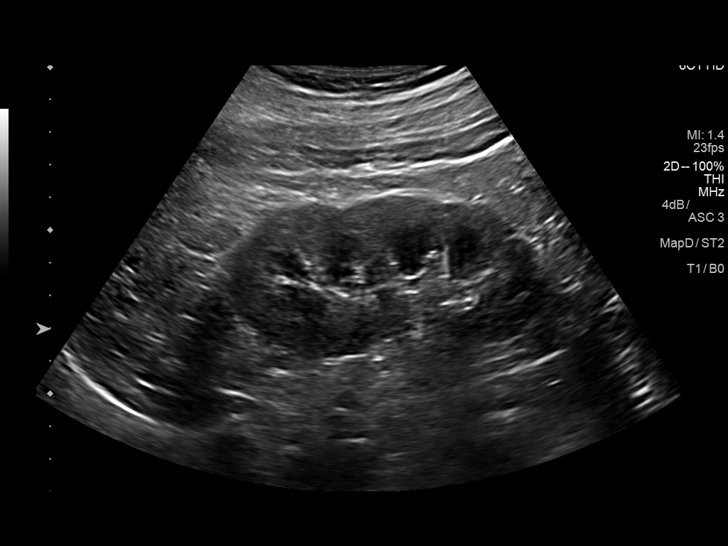
[im 39/59]
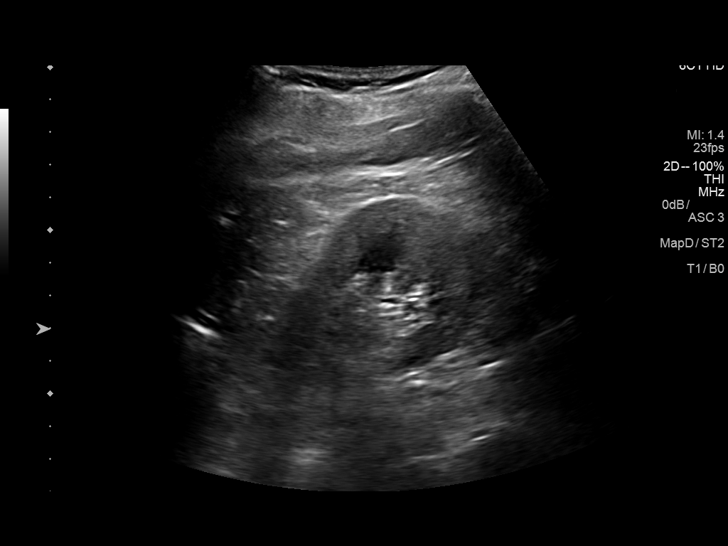
[im 44/59]
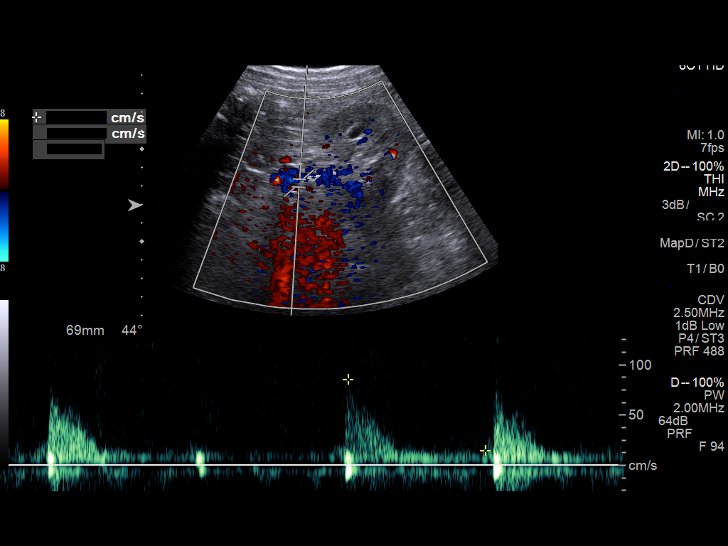
[im 49/59]
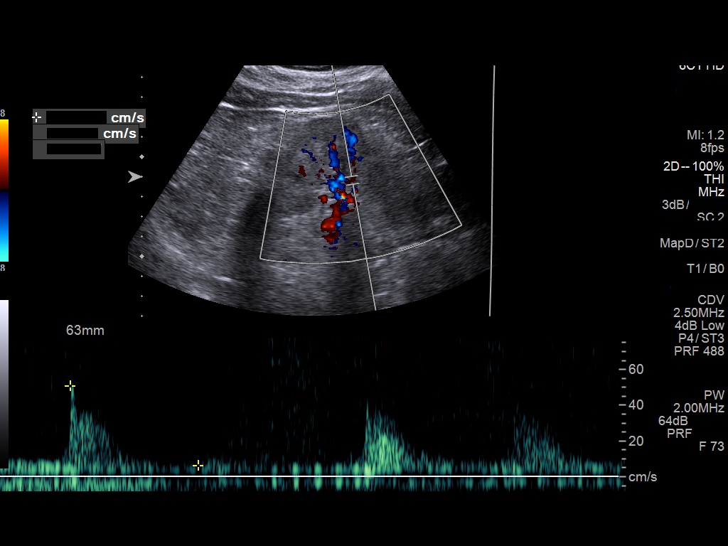
[im 54/59]
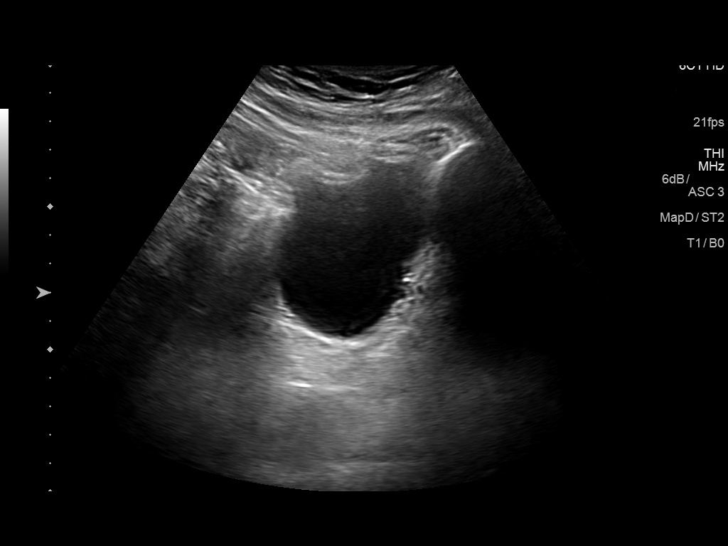
[im 59/59]
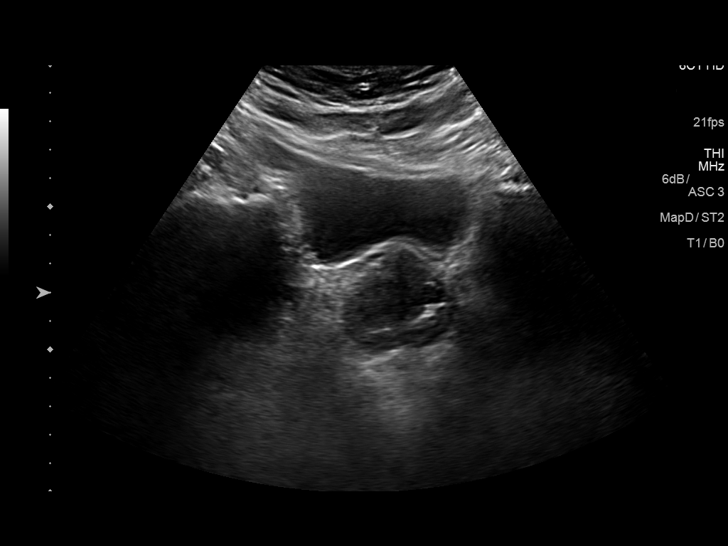

[13 of 25 positions shown; findings below may reference images not displayed]

FINDINGS: Right Kidney:

Normal cortical thickness, echogenicity and size, measuring 9.6 cm
in length. No focal renal lesions. No echogenic renal stones. No
urinary obstruction.

Left Kidney:

Normal cortical thickness, echogenicity and size, measuring 9.9 cm
in length. No focal renal lesions. No echogenic renal stones. No
urinary obstruction.

Bladder:  Appears normal given degree distention.

RENAL DUPLEX ULTRASOUND

Right Renal Artery Velocities:

Origin:  Not well visualized

Mid:  118 cm/sec

Hilum:  123 cm/sec

Interlobar:  48 cm/sec

Arcuate:  21 Cm/sec

Left Renal Artery Velocities:

Origin:  78 cm/sec

Mid:  87 cm/sec

Hilum:  86 cm/sec

Interlobar:  51 cm/sec

Arcuate:  25 cm/sec

Aortic Velocity:  144 Cm/sec

Right Renal-Aortic Ratios:

Origin: N/A

Mid:

Hilum:

Interlobar:

Arcuate:

Left Renal-Aortic Ratios:

Origin:

Mid:

Hilum:

Interlobar:

Arcuate:

Normal velocities and low resistance waveforms are demonstrated
throughout the bilateral renal artery's and renal parenchyma.

The bilateral renal veins appear widely patent.

The prostate appears borderline enlarged measuring 4.2 x 3.8 cm with
mass effect upon the undersurface of the urinary bladder.
IMPRESSION: 1. Normal renal ultrasound.  No evidence of renal artery stenosis.
2. Borderline prostatomegaly with mass effect upon the undersurface
of the urinary bladder.

## 2018-09-25 ENCOUNTER — Encounter: Payer: Medicare Other | Admitting: Internal Medicine

## 2018-09-25 ENCOUNTER — Other Ambulatory Visit: Payer: Self-pay

## 2018-09-25 NOTE — Progress Notes (Deleted)
  [facility] Telephone Encounter Continuity Care Appointment  HPI:   This telephone encounter was created for [name] on 09/25/2018 for the following purpose/cc ***.   Past Medical History:  Past Medical History:  Diagnosis Date  . Acute gastritis    hx of  . Diabetic peripheral neuropathy ([place])   . Dyslipidemia   . Hypertension   . Lumbar back pain   . Meralgia paresthetica   . Tubular adenoma of rectum 06/20/2010  . Type 2 diabetes mellitus ([facility])       ROS:      Assessment / Plan / Recommendations:   Please see A&P under problem oriented charting for assessment of the patient's acute and chronic medical conditions.   As always, pt is advised that if symptoms worsen or new symptoms arise, they should go to an urgent care facility or to to ER for further evaluation.   Consent and Medical Decision Making:   Patient {GC/GE:3044014::"discussed with","seen with"} Dr. {NAMES:3044014::"Butcher","Granfortuna","E. Orlie Cundari","Klima","Mullen","Narendra","Raines","Vincent"}  This is a telephone encounter between Dustin Burns and Dustin Burns on 09/25/2018 for ***. The visit was conducted with the patient located at {NAMES:3044014::"home"} and Dustin Burns at [employer]. The patient's identity was confirmed using their DOB and current address. The {WHO:3044014::"patient","his/her legal guardian","***"} has consented to being evaluated through a telephone encounter and understands the associated risks (an examination cannot be done and the patient may need to come in for an appointment) / benefits (allows the patient to remain at home, decreasing exposure to coronavirus). I personally spent {Numbers; [phone]:[id number]} minutes on medical discussion.      HPI:  Patient reports adherence to HCTZ 25mg  and amlodipine 10mg  daily  Assessment:  Essential hypertension Unable to take vitals due to tele-health visit.  Patient  denies side effects.  At this time will have patient follow up in person in 1-3 months for BP check  Plan -continue blood pressure medications  HPI:  Patient currently takes 16 units of lantus in the morning and 14 units in the evening.  He takes * glipizide.  His meter readings   Assessment:  Controlled type II diabetes Last hemoglobin A1C was 7.3 6 months ago.  Will have patient come in for hemoglobin A1C   Assessment:  CKD stage III 08/2017 bmet showed creatinine of 2.38 and GFR 28.  Will have patient obtain bmet  Plan -bmet

## 2018-10-09 ENCOUNTER — Other Ambulatory Visit: Payer: Self-pay

## 2018-10-09 MED ORDER — INSULIN GLARGINE 100 UNIT/ML SOLOSTAR PEN: PEN_INJECTOR | SUBCUTANEOUS | 1 refills | Status: AC

## 2018-10-09 NOTE — Telephone Encounter (Signed)
Insulin Glargine (LANTUS) 100 UNIT/ML Solostar Pen, refill request @  [place], [place] [phone] (Phone) [phone] (Fax)

## 2018-11-10 ENCOUNTER — Encounter: Payer: Self-pay | Admitting: *Deleted

## 2018-12-17 ENCOUNTER — Other Ambulatory Visit: Payer: Self-pay

## 2018-12-17 DIAGNOSIS — J3089 Other allergic rhinitis: Secondary | ICD-10-CM

## 2018-12-17 MED ORDER — LORATADINE 10 MG PO TABS: 10.0000 mg | ORAL_TABLET | Freq: Every day | ORAL | 0 refills | Status: AC

## 2018-12-17 NOTE — Telephone Encounter (Signed)
loratadine (EQ ALLERGY RELIEF) 10 MG tablet, REFILL REQUEST @  [facility] [place], [place] [phone] (Phone) [phone] (Fax)

## 2018-12-22 LAB — HEMOGLOBIN A1C: Hemoglobin A1C: 9.2

## 2019-01-20 ENCOUNTER — Encounter: Payer: Self-pay | Admitting: Pharmacist

## 2019-02-03 ENCOUNTER — Other Ambulatory Visit: Payer: Self-pay

## 2019-02-03 ENCOUNTER — Other Ambulatory Visit: Payer: Self-pay | Admitting: Internal Medicine

## 2019-02-03 ENCOUNTER — Encounter: Payer: Self-pay | Admitting: Internal Medicine

## 2019-02-03 ENCOUNTER — Ambulatory Visit (INDEPENDENT_AMBULATORY_CARE_PROVIDER_SITE_OTHER): Payer: Medicare Other | Admitting: Internal Medicine

## 2019-02-03 VITALS — BP 152/64 | HR 78 | Temp 98.9°F | Ht 64.0 in | Wt 156.4 lb

## 2019-02-03 DIAGNOSIS — Z794 Long term (current) use of insulin: Secondary | ICD-10-CM | POA: Diagnosis not present

## 2019-02-03 DIAGNOSIS — E118 Type 2 diabetes mellitus with unspecified complications: Secondary | ICD-10-CM

## 2019-02-03 LAB — POCT GLYCOSYLATED HEMOGLOBIN (HGB A1C): Hemoglobin A1C: 8 % — AB (ref 4.0–5.6)

## 2019-02-03 LAB — GLUCOSE, CAPILLARY: Glucose-Capillary: 251 mg/dL — ABNORMAL HIGH (ref 70–99)

## 2019-02-03 MED ORDER — LIXISENATIDE 10 & 20 MCG/0.2ML ~~LOC~~ PNKT: 10.0000 ug | PEN_INJECTOR | Freq: Every day | SUBCUTANEOUS | 0 refills | Status: AC

## 2019-02-03 NOTE — Progress Notes (Signed)
   CC:   HPI:  [name] is a 68 y.o. who arrives to the clinic for follow up and meeting new PCP. Please see problem based charting for the status of patients chronic medical conditions.   Past Medical History:  Diagnosis Date  . Acute gastritis    hx of  . Diabetic peripheral neuropathy ([place])   . Dyslipidemia   . Hypertension   . Lumbar back pain   . Meralgia paresthetica   . Tubular adenoma of rectum 06/20/2010  . Type 2 diabetes mellitus ([place])    Review of Systems:   General: Denies fatigue or weight changes Respiratory: Denies chest pain shortness of breath, wheezing, or cough.  Cardiovascular: Denies orthopnea, lower extremity edema Gastrointestinal: Denies constipation, diarrhea, or vomiting Urinary: Denies difficulty starting/halting urination, urinary frequency, or dysuria Neuro: endorses dizziness with low blood sugar, but denies headaches, or weakness.   Physical Exam:  Physical Exam Constitutional:      General: He is not in acute distress.    Appearance: He is normal weight. He is not ill-appearing.  HENT:     Head: Normocephalic and atraumatic.  Cardiovascular:     Rate and Rhythm: Normal rate and regular rhythm.     Pulses: Normal pulses.     Heart sounds: Normal heart sounds. No murmur. No friction rub. No gallop.   Pulmonary:     Effort: Pulmonary effort is normal.     Breath sounds: Normal breath sounds. No wheezing, rhonchi or rales.  Abdominal:     General: Abdomen is flat. Bowel sounds are normal.     Palpations: Abdomen is soft.     Tenderness: There is no abdominal tenderness. There is no guarding.  Musculoskeletal:     Right lower leg: No edema.     Left lower leg: No edema.  Neurological:     Mental Status: He is alert and oriented to person, place, and time.     Sensory: No sensory deficit.     Comments: CN II-XII are grossly intact bilaterally.   Psychiatric:        Mood and Affect: Mood normal.        Behavior: Behavior normal.      Assessment & Plan:   See Encounters Tab for problem based charting.  Patient seen with Dr. Philipp Burns

## 2019-02-03 NOTE — Assessment & Plan Note (Addendum)
Mr. Cobbins is a 68 y/o male that presents to the outpatient clinic for a follow up on his blood sugars. His Diabetes log show his average blood glucose was 177, he was 13% within his target range, and he was noted to have several hypoglycemic events. His recent lab report from [facility] (12/22/2018) showed his A1c of 9.2%, which has increased from 7.3%.  We spoke at length about the use of his short acting insulin and glipizide being the likely etiologies behind his hypoglycemic events. Patient states that his blood sugar is better than when he initially started taking medications. Patient was educated on the dangers of an increasing HgA1c including vision loss, kidney damage, heart attacks, and ultimately death. He stated that he understand and he has been a diabetic for 25 years. He also endorses financial strain, and not being able to afford food appropriate for a man with diabetes. He denied wanting to meet for a dietician in the clinic stating, "It will not help me." We offered samples of Trulicity for better management, but patient voiced that he did not trust a once weekly injection to cover his blood sugar. We spoke about cancelling his short acting insulin, and stated that it would be the best medical option to discontinue his glipizide, but patient states that he does not wish to stop his glipizide. We advised against taking it and will discharge his medications. Time spent working and discussing results and medical impact of poorly controlled diabetes with patient >50 minutes.  - D/C glipizide due  of hypoglycemic events. - D/C Humalog Qwikpen - Continue Lantus  - Ordered Lixisenatide 10 mcg QD, for 14 days. - Schedule an appointment in 14 days and instructed to bring glucose meter. - Ordered Diabetic foot exam to assess for sensory/physical damage in regards to uncontrolled diabetes with a new HgA1c of 9.2 - Ordered urine microalbumin/creatinine urine ratio to assess for new evidence of kidney damage  in regards to uncontrolled diabetes.  - Ordered a CBG.

## 2019-02-03 NOTE — Patient Instructions (Signed)
Thank you for coming to [facility]. We discussed your diabetes medications today. We recommend that you halt your glipizide and start you lixisenatide as prescribed. We want you to follow up with [place] in 2 weeks to read your meter. We will continue to look for inexpensive alternatives in the meantime. Thank you for letting us work with you.

## 2019-02-04 LAB — LIPID PANEL
Chol/HDL Ratio: 4.7 ratio (ref 0.0–5.0)
Cholesterol, Total: 222 mg/dL — ABNORMAL HIGH (ref 100–199)
HDL: 47 mg/dL (ref >39)
LDL Chol Calc (NIH): 124 mg/dL — ABNORMAL HIGH (ref 0–99)
Triglycerides: 287 mg/dL — ABNORMAL HIGH (ref 0–149)
VLDL Cholesterol Cal: 51 mg/dL — ABNORMAL HIGH (ref 5–40)

## 2019-02-04 NOTE — Telephone Encounter (Signed)
Insurance does not cover this med they prefer victoza, byetta or trulicity

## 2019-02-05 NOTE — Progress Notes (Signed)
Internal Medicine Clinic Attending  I saw and evaluated the patient.  I personally confirmed the key portions of the history and exam documented by Dr. Gilford Rile and I reviewed pertinent patient test results.  The assessment, diagnosis, and plan were formulated together and I agree with the documentation in the resident's note.   Difficult case of uncontrolled diabetes, hgb A1c of 9.2 and renal dysfunction, GFR < 30. Patient is taking lantus 14 unit qAM and 16 units qPM. He is also self dosing humalog PRN when CBGs are >300, but this is resulting in hypoglycemia. He is also on glipizide 5 mg XL. There is a language barrier and patient is extremely resistant to changing his regimen as he thinks his diabetes is under control. He continues to disagree with [place] despite our best attempt to educate him otherwise. Review of his glucometer shows CBGs ranging from the 50s - 450s. He is symptomatic when he gets hypoglycemic and corrects with juice. He's unable and unwilling to change his diet because he eats when he has money and eats whatever he can afford. Due to his hypoglycemia, I agree with stopping his short acting insulin and glipizide. He definitely needs a second agent in addition to lantus however options are limited due to financial difficulties and renal dysfunction. We discussed Willeen Niece but he cannot afford this even with his insurance. We have sent in a prescription for lixisenatide alone, but unsure if he will be able to afford the copay. We have reached out to our clinic pharmacist to see if he would qualify for any drug program financial assistance or Medicare extra. We do have samples of trulicity here in clinic unfortunately he is resistant to this particular medication due to commercials he has seen on TV.

## 2019-02-07 LAB — MICROALBUMIN / CREATININE URINE RATIO
Creatinine, Urine: 97.9 mg/dL
Microalb/Creat Ratio: 2215 mg/g creat — ABNORMAL HIGH (ref 0–29)
Microalbumin, Urine: 2168.1 ug/mL

## 2019-02-10 ENCOUNTER — Telehealth: Payer: Self-pay | Admitting: Internal Medicine

## 2019-02-10 NOTE — Telephone Encounter (Signed)
Pt is calling regarding medicine [phone] hydrochlorothiazide (HYDRODIURIL) 25 MG tablet [place], [place].

## 2019-02-17 ENCOUNTER — Encounter: Payer: Self-pay | Admitting: Internal Medicine

## 2019-02-17 ENCOUNTER — Ambulatory Visit: Payer: Medicare Other

## 2019-02-24 MED ORDER — HYDROCHLOROTHIAZIDE 25 MG PO TABS: 25.0000 mg | ORAL_TABLET | Freq: Every day | ORAL | 1 refills | Status: AC

## 2019-02-24 NOTE — Telephone Encounter (Signed)
Dr. Maudie Mercury, any suggestions for GLP-1 agonist for this patient if this is not covered. Thanks!

## 2019-03-13 DIAGNOSIS — R269 Unspecified abnormalities of gait and mobility: Secondary | ICD-10-CM | POA: Diagnosis not present

## 2019-03-13 DIAGNOSIS — M549 Dorsalgia, unspecified: Secondary | ICD-10-CM | POA: Diagnosis not present

## 2019-03-13 DIAGNOSIS — M5410 Radiculopathy, site unspecified: Secondary | ICD-10-CM | POA: Diagnosis not present

## 2019-03-13 DIAGNOSIS — M47816 Spondylosis without myelopathy or radiculopathy, lumbar region: Secondary | ICD-10-CM | POA: Diagnosis not present

## 2019-03-13 DIAGNOSIS — I708 Atherosclerosis of other arteries: Secondary | ICD-10-CM | POA: Diagnosis not present

## 2019-03-13 DIAGNOSIS — M160 Bilateral primary osteoarthritis of hip: Secondary | ICD-10-CM | POA: Diagnosis not present

## 2019-03-13 DIAGNOSIS — M79604 Pain in right leg: Secondary | ICD-10-CM | POA: Diagnosis not present

## 2019-03-13 DIAGNOSIS — M545 Low back pain: Secondary | ICD-10-CM | POA: Diagnosis not present

## 2019-03-13 DIAGNOSIS — R109 Unspecified abdominal pain: Secondary | ICD-10-CM | POA: Diagnosis not present

## 2019-03-17 ENCOUNTER — Other Ambulatory Visit: Payer: Self-pay | Admitting: Internal Medicine

## 2019-03-17 NOTE — Telephone Encounter (Signed)
Refill Request  amLODipine (NORVASC) 10 MG tablet(Expired)  [id number] - [place], [place] - [address]

## 2019-03-18 MED ORDER — AMLODIPINE BESYLATE 10 MG PO TABS: 10.0000 mg | ORAL_TABLET | Freq: Every day | ORAL | 1 refills | Status: AC

## 2019-03-26 DIAGNOSIS — Z79899 Other long term (current) drug therapy: Secondary | ICD-10-CM | POA: Diagnosis not present

## 2019-04-07 DIAGNOSIS — I129 Hypertensive chronic kidney disease with stage 1 through stage 4 chronic kidney disease, or unspecified chronic kidney disease: Secondary | ICD-10-CM | POA: Diagnosis not present

## 2019-04-07 DIAGNOSIS — N184 Chronic kidney disease, stage 4 (severe): Secondary | ICD-10-CM | POA: Diagnosis not present

## 2019-04-07 DIAGNOSIS — Z0001 Encounter for general adult medical examination with abnormal findings: Secondary | ICD-10-CM | POA: Diagnosis not present

## 2019-04-07 DIAGNOSIS — Z79899 Other long term (current) drug therapy: Secondary | ICD-10-CM | POA: Diagnosis not present

## 2019-04-07 DIAGNOSIS — Z794 Long term (current) use of insulin: Secondary | ICD-10-CM | POA: Diagnosis not present

## 2019-04-11 DIAGNOSIS — M4807 Spinal stenosis, lumbosacral region: Secondary | ICD-10-CM | POA: Diagnosis not present

## 2019-04-11 DIAGNOSIS — M5117 Intervertebral disc disorders with radiculopathy, lumbosacral region: Secondary | ICD-10-CM | POA: Diagnosis not present

## 2019-07-23 MED ORDER — GLUCOSE 40 % PO GEL
15.00 | ORAL | Status: DC
Start: ? — End: 2019-07-23

## 2019-07-23 MED ORDER — METOPROLOL TARTRATE 50 MG PO TABS
50.00 | ORAL_TABLET | ORAL | Status: DC
Start: 2019-07-23 — End: 2019-07-23

## 2019-07-23 MED ORDER — HEPARIN SOD (PORCINE) IN D5W 100 UNIT/ML IV SOLN
5.00 | INTRAVENOUS | Status: DC
Start: ? — End: 2019-07-23

## 2019-07-23 MED ORDER — INFLUENZA VAC HIGH-DOSE QUAD 0.7 ML IM SUSY
0.70 | PREFILLED_SYRINGE | INTRAMUSCULAR | Status: DC
Start: ? — End: 2019-07-23

## 2019-07-23 MED ORDER — INSULIN LISPRO 100 UNIT/ML ~~LOC~~ SOLN
2.00 | SUBCUTANEOUS | Status: DC
Start: 2019-07-23 — End: 2019-07-23

## 2019-07-23 MED ORDER — TERAZOSIN HCL 1 MG PO CAPS
2.00 | ORAL_CAPSULE | ORAL | Status: DC
Start: 2019-07-24 — End: 2019-07-23

## 2019-07-23 MED ORDER — NITROGLYCERIN 0.4 MG SL SUBL
0.40 | SUBLINGUAL_TABLET | SUBLINGUAL | Status: DC
Start: ? — End: 2019-07-23

## 2019-07-23 MED ORDER — FERROUS SULFATE 325 (65 FE) MG PO TABS
325.00 | ORAL_TABLET | ORAL | Status: DC
Start: 2019-07-24 — End: 2019-07-23

## 2019-07-23 MED ORDER — DEXTROSE 10 % IV SOLN
125.00 | INTRAVENOUS | Status: DC
Start: ? — End: 2019-07-23

## 2019-07-23 MED ORDER — INSULIN GLARGINE 100 UNIT/ML ~~LOC~~ SOLN
5.00 | SUBCUTANEOUS | Status: DC
Start: 2019-07-23 — End: 2019-07-23

## 2019-07-23 MED ORDER — ISOSORBIDE MONONITRATE ER 30 MG PO TB24
30.00 | ORAL_TABLET | ORAL | Status: DC
Start: 2019-07-24 — End: 2019-07-23

## 2019-07-23 MED ORDER — ASPIRIN 81 MG PO TBEC
81.00 | DELAYED_RELEASE_TABLET | ORAL | Status: DC
Start: 2019-07-24 — End: 2019-07-23

## 2019-07-23 MED ORDER — PNEUMOCOCCAL VAC POLYVALENT 25 MCG/0.5ML IJ INJ
0.50 | INJECTION | INTRAMUSCULAR | Status: DC
Start: ? — End: 2019-07-23

## 2019-07-23 MED ORDER — SALINE NASAL SPRAY 0.65 % NA SOLN
1.00 | NASAL | Status: DC
Start: ? — End: 2019-07-23

## 2019-07-23 MED ORDER — MELATONIN 3 MG PO TABS
3.00 | ORAL_TABLET | ORAL | Status: DC
Start: ? — End: 2019-07-23

## 2019-07-23 MED ORDER — AMLODIPINE BESYLATE 5 MG PO TABS
10.00 | ORAL_TABLET | ORAL | Status: DC
Start: 2019-07-24 — End: 2019-07-23

## 2019-07-23 MED ORDER — ROSUVASTATIN CALCIUM 5 MG PO TABS
10.00 | ORAL_TABLET | ORAL | Status: DC
Start: 2019-07-23 — End: 2019-07-23

## 2019-10-19 ENCOUNTER — Other Ambulatory Visit: Payer: Self-pay | Admitting: Internal Medicine

## 2019-10-20 NOTE — Telephone Encounter (Signed)
RX denied d/t pt is no longer under provider's care
